# Patient Record
Sex: Female | Born: 1995 | Race: White | Hispanic: No | Smoking: Never smoker
Health system: Southern US, Community
[De-identification: ages and names within clinical notes are randomized; demographics above are authoritative.]

## PROBLEM LIST (undated history)

## (undated) ENCOUNTER — Inpatient Hospital Stay: Payer: Self-pay

## (undated) DIAGNOSIS — J309 Allergic rhinitis, unspecified: Secondary | ICD-10-CM

## (undated) DIAGNOSIS — F419 Anxiety disorder, unspecified: Secondary | ICD-10-CM

## (undated) DIAGNOSIS — F32A Depression, unspecified: Secondary | ICD-10-CM

## (undated) DIAGNOSIS — F329 Major depressive disorder, single episode, unspecified: Secondary | ICD-10-CM

## (undated) DIAGNOSIS — T7840XA Allergy, unspecified, initial encounter: Secondary | ICD-10-CM

## (undated) DIAGNOSIS — R51 Headache: Secondary | ICD-10-CM

## (undated) DIAGNOSIS — Z789 Other specified health status: Secondary | ICD-10-CM

## (undated) DIAGNOSIS — R519 Headache, unspecified: Secondary | ICD-10-CM

## (undated) DIAGNOSIS — N6459 Other signs and symptoms in breast: Secondary | ICD-10-CM

## (undated) DIAGNOSIS — R319 Hematuria, unspecified: Secondary | ICD-10-CM

## (undated) HISTORY — PX: OTHER SURGICAL HISTORY: SHX169

## (undated) HISTORY — DX: Headache: R51

## (undated) HISTORY — DX: Allergy, unspecified, initial encounter: T78.40XA

## (undated) HISTORY — DX: Major depressive disorder, single episode, unspecified: F32.9

## (undated) HISTORY — DX: Anxiety disorder, unspecified: F41.9

## (undated) HISTORY — PX: COSMETIC SURGERY: SHX468

## (undated) HISTORY — DX: Depression, unspecified: F32.A

## (undated) HISTORY — DX: Headache, unspecified: R51.9

## (undated) HISTORY — DX: Allergic rhinitis, unspecified: J30.9

## (undated) HISTORY — PX: BREAST SURGERY: SHX581

## (undated) HISTORY — DX: Hematuria, unspecified: R31.9

## (undated) HISTORY — DX: Other signs and symptoms in breast: N64.59

## (undated) HISTORY — PX: NO PAST SURGERIES: SHX2092

---
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
## 2014-01-13 ENCOUNTER — Emergency Department: Payer: Self-pay | Admitting: Emergency Medicine

## 2014-07-05 ENCOUNTER — Ambulatory Visit: Payer: Self-pay | Admitting: Family Medicine

## 2014-07-14 LAB — HM PAP SMEAR: HM PAP: NEGATIVE

## 2015-01-16 ENCOUNTER — Other Ambulatory Visit: Payer: Self-pay | Admitting: Obstetrics and Gynecology

## 2015-05-04 ENCOUNTER — Encounter: Payer: Self-pay | Admitting: Obstetrics and Gynecology

## 2015-05-04 ENCOUNTER — Ambulatory Visit (INDEPENDENT_AMBULATORY_CARE_PROVIDER_SITE_OTHER): Payer: Medicaid Other | Admitting: Obstetrics and Gynecology

## 2015-05-04 VITALS — BP 105/70 | HR 71 | Ht 62.0 in | Wt 96.6 lb

## 2015-05-04 DIAGNOSIS — Z30018 Encounter for initial prescription of other contraceptives: Secondary | ICD-10-CM

## 2015-05-04 LAB — POCT URINE PREGNANCY: Preg Test, Ur: NEGATIVE

## 2015-05-04 NOTE — Progress Notes (Signed)
     GYNECOLOGY CLINIC PROGRESS NOTE  Subjective:    Patient ID: Marcia Hodges, female    DOB: July 22, 1996, 19 y.o.   MRN: [id number]  HPI [facility]lig is a 19 y.o. G0P0000 here for discussion of Nexplanon and possible insertion.    No pap history.  No other gynecologic concerns.   Had Mirena IUD removed in January 2016 due to debilitating cramping. Is currently using progesterone OCPs.  Desires Nexplanon insertion today as her insurance is due to end this month.   The following portions of the patient's history were reviewed and updated as appropriate: allergies, current medications, past family history, past medical history, past social history, past surgical history and problem list.  Review of Systems A comprehensive review of systems was negative except for: Genitourinary: positive for abnormal menstrual periods Neurological: positive for migraines without aura   Objective:   Blood pressure 105/70, pulse 71, height  (1.575 m), weight 96 lb 9.6 oz (43.817 kg), last menstrual period 01/04/2015. General appearance: alert and no distress Exam deferred.    Assessment:   Desiring Nexplanon contraception   Plan:   Reviewed all forms of birth control options available including abstinence; over the counter/barrier methods; hormonal contraceptive medication including pill, patch, ring, injection,contraceptive implant; hormonal and nonhormonal IUDs;  Risks and benefits reviewed.  Questions were answered.   Patient does not desire to continue use of progesterone OCPs as they worsen her migraines.  Is now interested in Nexplanon.  Reports that she has researched the device online and is familiar with risks and side effects. After further discussion, decision was made to place Nexplanon today.     A total of 15 minutes were spent face-to-face with the patient during this encounter and over half of that time dealt with counseling and coordination of care.   Nexplanon  Insertion Procedure Patient identified, informed consent performed, consent signed.   Patient does understand that irregular bleeding is a very common side effect of this medication. She was advised to have backup contraception for one week after placement. Pregnancy test in clinic today was negative.  Appropriate time out taken.  Patient's left arm was prepped and draped in the usual sterile fashion.. The ruler used to measure and mark insertion area.  Patient was prepped with alcohol swab and then injected with 3 ml of 1% lidocaine.  She was prepped with betadine, Nexplanon removed from packaging,  Device confirmed in needle, then inserted full length of needle and withdrawn per handbook instructions. Nexplanon was able to palpated in the patient's arm; patient palpated the insert herself. There was minimal blood loss.  Patient insertion site covered with guaze and a pressure bandage to reduce any bruising.  The patient tolerated the procedure well and was given post procedure instructions.    Hildred Laser, MD [facility]

## 2015-05-04 NOTE — Patient Instructions (Signed)
Etonogestrel implant What is this medicine? ETONOGESTREL (et oh noe JES trel) is a contraceptive (birth control) device. It is used to prevent pregnancy. It can be used for up to 3 years. This medicine may be used for other purposes; ask your health care provider or pharmacist if you have questions. COMMON BRAND NAME(S): Implanon, Nexplanon What should I tell my health care provider before I take this medicine? They need to know if you have any of these conditions: -abnormal vaginal bleeding -blood vessel disease or blood clots -cancer of the breast, cervix, or liver -depression -diabetes -gallbladder disease -headaches -heart disease or recent heart attack -high blood pressure -high cholesterol -kidney disease -liver disease -renal disease -seizures -tobacco smoker -an unusual or allergic reaction to etonogestrel, other hormones, anesthetics or antiseptics, medicines, foods, dyes, or preservatives -pregnant or trying to get pregnant -breast-feeding How should I use this medicine? This device is inserted just under the skin on the inner side of your upper arm by a health care professional. Talk to your pediatrician regarding the use of this medicine in children. Special care may be needed. Overdosage: If you think you've taken too much of this medicine contact a poison control center or emergency room at once. Overdosage: If you think you have taken too much of this medicine contact a poison control center or emergency room at once. NOTE: This medicine is only for you. Do not share this medicine with others. What if I miss a dose? This does not apply. What may interact with this medicine? Do not take this medicine with any of the following medications: -amprenavir -bosentan -fosamprenavir This medicine may also interact with the following medications: -barbiturate medicines for inducing sleep or treating seizures -certain medicines for fungal infections like ketoconazole and  itraconazole -griseofulvin -medicines to treat seizures like carbamazepine, felbamate, oxcarbazepine, phenytoin, topiramate -modafinil -phenylbutazone -rifampin -some medicines to treat HIV infection like atazanavir, indinavir, lopinavir, nelfinavir, tipranavir, ritonavir -St. John's wort This list may not describe all possible interactions. Give your health care provider a list of all the medicines, herbs, non-prescription drugs, or dietary supplements you use. Also tell them if you smoke, drink alcohol, or use illegal drugs. Some items may interact with your medicine. What should I watch for while using this medicine? This product does not protect you against HIV infection (AIDS) or other sexually transmitted diseases. You should be able to feel the implant by pressing your fingertips over the skin where it was inserted. Tell your doctor if you cannot feel the implant. What side effects may I notice from receiving this medicine? Side effects that you should report to your doctor or health care professional as soon as possible: -allergic reactions like skin rash, itching or hives, swelling of the face, lips, or tongue -breast lumps -changes in vision -confusion, trouble speaking or understanding -dark urine -depressed mood -general ill feeling or flu-like symptoms -light-colored stools -loss of appetite, nausea -right upper belly pain -severe headaches -severe pain, swelling, or tenderness in the abdomen -shortness of breath, chest pain, swelling in a leg -signs of pregnancy -sudden numbness or weakness of the face, arm or leg -trouble walking, dizziness, loss of balance or coordination -unusual vaginal bleeding, discharge -unusually weak or tired -yellowing of the eyes or skin Side effects that usually do not require medical attention (Report these to your doctor or health care professional if they continue or are bothersome.): -acne -breast pain -changes in  weight -cough -fever or chills -headache -irregular menstrual bleeding -itching, burning, and   vaginal discharge -pain or difficulty passing urine -sore throat This list may not describe all possible side effects. Call your doctor for medical advice about side effects. You may report side effects to FDA at 1-800-FDA-1088. Where should I keep my medicine? This drug is given in a hospital or clinic and will not be stored at home. NOTE: This sheet is a summary. It may not cover all possible information. If you have questions about this medicine, talk to your doctor, pharmacist, or health care provider.  2015, [employer]. (2012-01-27 15:37:45)  

## 2015-05-04 NOTE — Addendum Note (Signed)
Addended by: Fabian November on: 05/04/2015 07:48 PM   Modules accepted: Orders, Medications

## 2015-08-02 ENCOUNTER — Ambulatory Visit: Payer: Medicaid Other | Admitting: Obstetrics and Gynecology

## 2015-08-06 NOTE — L&D Delivery Note (Signed)
[facility]  Labor Events:   Preterm labor:   Rupture date:   Rupture time: Unknown time  Rupture type: Clear fluid  Fluid Color:   Induction:   Augmentation:   Complications:   Cervical ripening:          Delivery:   Episiotomy:   Lacerations:   Repair suture:   Repair # of packets:   Blood loss (ml): 200   Information for the patient's newborn:  Marcia Hodges, Marcia Hodges [[id number]][[id number]]    Delivery 04/25/2016 6:06 PM by  Vaginal, Spontaneous Delivery Sex:  female Gestational Age: 6112w4d Delivery Clinician:   Living?:         APGARS  One minute Five minutes Ten minutes  Skin color:        Heart rate:        Grimace:        Muscle tone:        Breathing:        Totals: 7  9      Presentation/position:      Resuscitation:   Cord information:    Disposition of cord blood:     Blood gases sent?  Complications:   Placenta: Delivered:       appearance Newborn Measurements: Weight: 7 lb 1.2 oz (3210 g)  Height: 20.67"  Head circumference:    Chest circumference:    Other providers:    Additional  information: Forceps:   Vacuum:   Breech:   Observed anomalies       Delivery Note At 6:06 PM a viable and healthy female was delivered via Vaginal, Spontaneous Delivery (Presentation: Vertex; LOA position).  APGAR: 7, 9; weight 3210 grams.   Placenta status: removed spontaneously, intact.  Cord:  with the following complications: None.  Cord pH: not obtained  Anesthesia:  Epidural Episiotomy:  None Lacerations:  1st degree left periurethral; hemostatic Suture Repair: None Est. Blood Loss (mL):  200 ml  Mom to postpartum.  Baby to Couplet care / Skin to Skin.  [name] 04/25/2016, 6:32 PM

## 2015-09-05 ENCOUNTER — Ambulatory Visit (INDEPENDENT_AMBULATORY_CARE_PROVIDER_SITE_OTHER): Payer: Medicaid Other | Admitting: Obstetrics and Gynecology

## 2015-09-05 VITALS — BP 109/59 | HR 87 | Wt 101.2 lb

## 2015-09-05 DIAGNOSIS — Z3687 Encounter for antenatal screening for uncertain dates: Secondary | ICD-10-CM

## 2015-09-05 DIAGNOSIS — Z331 Pregnant state, incidental: Secondary | ICD-10-CM

## 2015-09-05 DIAGNOSIS — Z36 Encounter for antenatal screening of mother: Secondary | ICD-10-CM

## 2015-09-05 DIAGNOSIS — Z3490 Encounter for supervision of normal pregnancy, unspecified, unspecified trimester: Secondary | ICD-10-CM

## 2015-09-05 DIAGNOSIS — Z369 Encounter for antenatal screening, unspecified: Secondary | ICD-10-CM

## 2015-09-05 DIAGNOSIS — Z1389 Encounter for screening for other disorder: Secondary | ICD-10-CM

## 2015-09-05 DIAGNOSIS — Z113 Encounter for screening for infections with a predominantly sexual mode of transmission: Secondary | ICD-10-CM

## 2015-09-05 NOTE — Patient Instructions (Signed)
Pregnancy and Zika Virus Disease Zika virus disease, or Zika, is an illness that can spread to people from mosquitoes that carry the virus. It may also spread from person to person through infected body fluids. Zika first occurred in [place], but recently it has spread to new areas. The virus occurs in tropical climates. The location of Zika continues to change. Most people who become infected with Zika virus do not develop serious illness. However, Zika may cause birth defects in an unborn baby whose mother is infected with the virus. It may also increase the risk of miscarriage. WHAT ARE THE SYMPTOMS OF ZIKA VIRUS DISEASE? In many cases, people who have been infected with Zika virus do not develop any symptoms. If symptoms appear, they usually start about a week after the person is infected. Symptoms are usually mild. They may include:  Fever.  Rash.  Red eyes.  Joint pain. HOW DOES ZIKA VIRUS DISEASE SPREAD? The main way that Zika virus spreads is through the bite of a certain type of mosquito. Unlike most types of mosquitos, which bite only at night, the type of mosquito that carries Zika virus bites both at night and during the day. Zika virus can also spread through sexual contact, through a blood transfusion, and from a mother to her baby before or during birth. Once you have had Zika virus disease, it is unlikely that you will get it again. CAN I PASS ZIKA TO MY BABY DURING PREGNANCY? Yes, Zika can pass from a mother to her baby before or during birth. WHAT PROBLEMS CAN ZIKA CAUSE FOR MY BABY? A woman who is infected with Zika virus while pregnant is at risk of having her baby born with a condition in which the brain or head is smaller than expected (microcephaly). Babies who have microcephaly can have developmental delays, seizures, hearing problems, and vision problems. Having Zika virus disease during pregnancy can also increase the risk of miscarriage. HOW CAN ZIKA VIRUS DISEASE BE  PREVENTED? There is no vaccine to prevent Zika. The best way to prevent the disease is to avoid infected mosquitoes and avoid exposure to body fluids that can spread the virus. Avoid any possible exposure to Zika by taking the following precautions. For women and their sex partners:  Avoid traveling to high-risk areas. The locations where Zika is being reported change often. To identify high-risk areas, check the CDC travel website: www.cdc.gov/zika/geo/index.html  If you or your sex partner must travel to a high-risk area, talk with a health care provider before and after traveling.  Take all precautions to avoid mosquito bites if you live in, or travel to, any of the high-risk areas. Insect repellents are safe to use during pregnancy.  Ask your health care provider when it is safe to have sexual contact. For women:  If you are pregnant or trying to become pregnant, avoid sexual contact with persons who may have been exposed to Zika virus, persons who have possible symptoms of Zika, or persons whose history you are unsure about. If you choose to have sexual contact with someone who may have been exposed to Zika virus, use condoms correctly during the entire duration of sexual activity, every time. Do not share sexual devices, as you may be exposed to body fluids.  Ask your health care provider about when it is safe to attempt pregnancy after a possible exposure to Zika virus. WHAT STEPS SHOULD I TAKE TO AVOID MOSQUITO BITES? Take these steps to avoid mosquito bites when you are   in a high-risk area:  Wear loose clothing that covers your arms and legs.  Limit your outdoor activities.  Do not open windows unless they have window screens.  Sleep under mosquito nets.  Use insect repellent. The best insect repellents have:  DEET, picaridin, oil of lemon eucalyptus (OLE), or IR3535 in them.  Higher amounts of an active ingredient in them.  Remember that insect repellents are safe to use  during pregnancy.  Do not use OLE on children who are younger than 3 years of age. Do not use insect repellent on babies who are younger than 2 months of age.  Cover your child's stroller with mosquito netting. Make sure the netting fits snugly and that any loose netting does not cover your child's mouth or nose. Do not use a blanket as a mosquito-protection cover.  Do not apply insect repellent underneath clothing.  If you are using sunscreen, apply the sunscreen before applying the insect repellent.  Treat clothing with permethrin. Do not apply permethrin directly to your skin. Follow label directions for safe use.  Get rid of standing water, where mosquitoes may reproduce. Standing water is often found in items such as buckets, bowls, animal food dishes, and flowerpots. When you return from traveling to any high-risk area, continue taking actions to protect yourself against mosquito bites for 3 weeks, even if you show no signs of illness. This will prevent spreading Zika virus to uninfected mosquitoes. WHAT SHOULD I KNOW ABOUT THE SEXUAL TRANSMISSION OF ZIKA? People can spread Zika to their sexual partners during vaginal, anal, or oral sex, or by sharing sexual devices. Many people with Zika do not develop symptoms, so a person could spread the disease without knowing that they are infected. The greatest risk is to women who are pregnant or who may become pregnant. Zika virus can live longer in semen than it can live in blood. Couples can prevent sexual transmission of the virus by:  Using condoms correctly during the entire duration of sexual activity, every time. This includes vaginal, anal, and oral sex.  Not sharing sexual devices. Sharing increases your risk of being exposed to body fluid from another person.  Avoiding all sexual activity until your health care provider says it is safe. SHOULD I BE TESTED FOR ZIKA VIRUS? A sample of your blood can be tested for Zika virus. A pregnant  woman should be tested if she may have been exposed to the virus or if she has symptoms of Zika. She may also have additional tests done during her pregnancy, such ultrasound testing. Talk with your health care provider about which tests are recommended.   This information is not intended to replace advice given to you by your health care provider. Make sure you discuss any questions you have with your health care provider.   Document Released: 04/12/2015 Document Reviewed: 04/05/2015 Elsevier Interactive Patient Education 2016 [employer] Hyperemesis Gravidarum Hyperemesis gravidarum is a severe form of nausea and vomiting that happens during pregnancy. Hyperemesis is worse than morning sickness. It may cause you to have nausea or vomiting all day for many days. It may keep you from eating and drinking enough food and liquids. Hyperemesis usually occurs during the first half (the first 20 weeks) of pregnancy. It often goes away once a woman is in her second half of pregnancy. However, sometimes hyperemesis continues through an entire pregnancy.  CAUSES  The cause of this condition is not completely known but is thought to be related to changes in   the body's hormones when pregnant. It could be from the high level of the pregnancy hormone or an increase in estrogen in the body.  SIGNS AND SYMPTOMS   Severe nausea and vomiting.  Nausea that does not go away.  Vomiting that does not allow you to keep any food down.  Weight loss and body fluid loss (dehydration).  Having no desire to eat or not liking food you have previously enjoyed. DIAGNOSIS  Your health care provider will do a physical exam and ask you about your symptoms. He or she may also order blood tests and urine tests to make sure something else is not causing the problem.  TREATMENT  You may only need medicine to control the problem. If medicines do not control the nausea and vomiting, you will be treated in the hospital to prevent  dehydration, increased acid in the blood (acidosis), weight loss, and changes in the electrolytes in your body that may harm the unborn baby (fetus). You may need IV fluids.  HOME CARE INSTRUCTIONS   Only take over-the-counter or prescription medicines as directed by your health care provider.  Try eating a couple of dry crackers or toast in the morning before getting out of bed.  Avoid foods and smells that upset your stomach.  Avoid fatty and spicy foods.  Eat 5-6 small meals a day.  Do not drink when eating meals. Drink between meals.  For snacks, eat high-protein foods, such as cheese.  Eat or suck on things that have ginger in them. Ginger helps nausea.  Avoid food preparation. The smell of food can spoil your appetite.  Avoid iron pills and iron in your multivitamins until after 3-4 months of being pregnant. However, consult with your health care provider before stopping any prescribed iron pills. SEEK MEDICAL CARE IF:   Your abdominal pain increases.  You have a severe headache.  You have vision problems.  You are losing weight. SEEK IMMEDIATE MEDICAL CARE IF:   You are unable to keep fluids down.  You vomit blood.  You have constant nausea and vomiting.  You have excessive weakness.  You have extreme thirst.  You have dizziness or fainting.  You have a fever or persistent symptoms for more than 2-3 days.  You have a fever and your symptoms suddenly get worse. MAKE SURE YOU:   Understand these instructions.  Will watch your condition.  Will get help right away if you are not doing well or get worse.   This information is not intended to replace advice given to you by your health care provider. Make sure you discuss any questions you have with your health care provider.   Document Released: 07/22/2005 Document Revised: 05/12/2013 Document Reviewed: 03/03/2013 Elsevier Interactive Patient Education 2016 Elsevier Inc. Minor Illnesses and Medications in  Pregnancy  Cold/Flu:  Sudafed for congestion- Robitussin (plain) for cough- Tylenol for discomfort.  Please follow the directions on the label.  Try not to take any more than needed.  OTC Saline nasal spray and air humidifier or cool-mist  Vaporizer to sooth nasal irritation and to loosen congestion.  It is also important to increase intake of non carbonated fluids, especially if you have a fever.  Constipation:  Colace-2 capsules at bedtime; Metamucil- follow directions on label; Senokot- 1 tablet at bedtime.  Any one of these medications can be used.  It is also very important to increase fluids and fruits along with regular exercise.  If problem persists please call the office.  Diarrhea:    Kaopectate as directed on the label.  Eat a bland diet and increase fluids.  Avoid highly seasoned foods.  Headache:  Tylenol 1 or 2 tablets every 3-4 hours as needed  Indigestion:  Maalox, Mylanta, Tums or Rolaids- as directed on label.  Also try to eat small meals and avoid fatty, greasy or spicy foods.  Nausea with or without Vomiting:  Nausea in pregnancy is caused by increased levels of hormones in the body which influence the digestive system and cause irritation when stomach acids accumulate.  Symptoms usually subside after 1st trimester of pregnancy.  Try the following:  Keep saltines, graham crackers or dry toast by your bed to eat upon awakening.  Don't let your stomach get empty.  Try to eat 5-6 small meals per day instead of 3 large ones.  Avoid greasy fatty or highly seasoned foods.   Take OTC Unisom 1 tablet at bed time along with OTC Vitamin B6 25-50 mg 3 times per day.    If nausea continues with vomiting and you are unable to keep down food and fluids you may need a prescription medication.  Please notify your provider.   Sore throat:  Chloraseptic spray, throat lozenges and or plain Tylenol.  Vaginal Yeast Infection:  OTC Monistat for 7 days as directed on label.  If symptoms do not  resolve within a week notify provider.  If any of the above problems do not subside with recommended treatment please call the office for further assistance.   Do not take Aspirin, Advil, Motrin or Ibuprofen.  * * OTC= Over the counter Commonly Asked Questions During Pregnancy  Cats: A parasite can be excreted in cat feces.  To avoid exposure you need to have another person empty the little box.  If you must empty the litter box you will need to wear gloves.  Wash your hands after handling your cat.  This parasite can also be found in raw or undercooked meat so this should also be avoided.  Colds, Sore Throats, Flu: Please check your medication sheet to see what you can take for symptoms.  If your symptoms are unrelieved by these medications please call the office.  Dental Work: Most any dental work your dentist recommends is permitted.  X-rays should only be taken during the first trimester if absolutely necessary.  Your abdomen should be shielded with a lead apron during all x-rays.  Please notify your provider prior to receiving any x-rays.  Novocaine is fine; gas is not recommended.  If your dentist requires a note from us prior to dental work please call the office and we will provide one for you.  Exercise: Exercise is an important part of staying healthy during your pregnancy.  You may continue most exercises you were accustomed to prior to pregnancy.  Later in your pregnancy you will most likely notice you have difficulty with activities requiring balance like riding a bicycle.  It is important that you listen to your body and avoid activities that put you at a higher risk of falling.  Adequate rest and staying well hydrated are a must!  If you have questions about the safety of specific activities ask your provider.    Exposure to Children with illness: Try to avoid obvious exposure; report any symptoms to us when noted,  If you have chicken pos, red measles or mumps, you should be immune to  these diseases.   Please do not take any vaccines while pregnant unless you have checked with your   OB provider.  Fetal Movement: After 28 weeks we recommend you do "kick counts" twice daily.  Lie or sit down in a calm quiet environment and count your baby movements "kicks".  You should feel your baby at least 10 times per hour.  If you have not felt 10 kicks within the first hour get up, walk around and have something sweet to eat or drink then repeat for an additional hour.  If count remains less than 10 per hour notify your provider.  Fumigating: Follow your pest control agent's advice as to how long to stay out of your home.  Ventilate the area well before re-entering.  Hemorrhoids:   Most over-the-counter preparations can be used during pregnancy.  Check your medication to see what is safe to use.  It is important to use a stool softener or fiber in your diet and to drink lots of liquids.  If hemorrhoids seem to be getting worse please call the office.   Hot Tubs:  Hot tubs Jacuzzis and saunas are not recommended while pregnant.  These increase your internal body temperature and should be avoided.  Intercourse:  Sexual intercourse is safe during pregnancy as long as you are comfortable, unless otherwise advised by your provider.  Spotting may occur after intercourse; report any bright red bleeding that is heavier than spotting.  Labor:  If you know that you are in labor, please go to the hospital.  If you are unsure, please call the office and let us help you decide what to do.  Lifting, straining, etc:  If your job requires heavy lifting or straining please check with your provider for any limitations.  Generally, you should not lift items heavier than that you can lift simply with your hands and arms (no back muscles)  Painting:  Paint fumes do not harm your pregnancy, but may make you ill and should be avoided if possible.  Latex or water based paints have less odor than oils.  Use adequate  ventilation while painting.  Permanents & Hair Color:  Chemicals in hair dyes are not recommended as they cause increase hair dryness which can increase hair loss during pregnancy.  " Highlighting" and permanents are allowed.  Dye may be absorbed differently and permanents may not hold as well during pregnancy.  Sunbathing:  Use a sunscreen, as skin burns easily during pregnancy.  Drink plenty of fluids; avoid over heating.  Tanning Beds:  Because their possible side effects are still unknown, tanning beds are not recommended.  Ultrasound Scans:  Routine ultrasounds are performed at approximately 20 weeks.  You will be able to see your baby's general anatomy an if you would like to know the gender this can usually be determined as well.  If it is questionable when you conceived you may also receive an ultrasound early in your pregnancy for dating purposes.  Otherwise ultrasound exams are not routinely performed unless there is a medical necessity.  Although you can request a scan we ask that you pay for it when conducted because insurance does not cover " patient request" scans.  Work: If your pregnancy proceeds without complications you may work until your due date, unless your physician or employer advises otherwise.  Round Ligament Pain/Pelvic Discomfort:  Sharp, shooting pains not associated with bleeding are fairly common, usually occurring in the second trimester of pregnancy.  They tend to be worse when standing up or when you remain standing for long periods of time.  These are the result of   pressure of certain pelvic ligaments called "round ligaments".  Rest, Tylenol and heat seem to be the most effective relief.  As the womb and fetus grow, they rise out of the pelvis and the discomfort improves.  Please notify the office if your pain seems different than that described.  It may represent a more serious condition.   

## 2015-09-05 NOTE — Progress Notes (Signed)
   [facility] presents for NOB nurse interview visit. G-1. P-0. Has pregnancy confirmation at [facility] on 08/23/15. LMP: 07/23/2015 (approx), EDD: 04/28/2016 by lmp. Pregnancy education material explained and given. No cats in the home. NOB labs ordered.  HIV labs and Drug screen were explained optional and she could opt out of tests but did not decline. Drug screen ordered/declined. PNV encouraged. NT to discuss with provider. Has mostly nausea. Pt encouraged to take Vit. B6 3xd and Unisom at night.  Pt. To follow up with provider in 6 weeks for NOB physical. Appt may need adjusting if viability and dating ultrasound differs.  Pt also brought copies of all immunizations which will be scanned to chart. All questions answered.  ZIKA EXPOSURE SCREEN:  The patient has not traveled to a [place] Virus endemic area within the past 6 months, nor has she had unprotected sex with a partner who has travelled to a [place] endemic region within the past 6 months. The patient has been advised to notify us if these factors change any time during this current pregnancy, so adequate testing and monitoring can be initiated.

## 2015-09-06 LAB — PAIN MGT SCRN (14 DRUGS), UR
Amphetamine Screen, Ur: NEGATIVE ng/mL
BARBITURATE SCRN UR: NEGATIVE ng/mL
BENZODIAZEPINE SCREEN, URINE: NEGATIVE ng/mL
BUPRENORPHINE, URINE: NEGATIVE ng/mL
COCAINE(METAB.) SCREEN, URINE: NEGATIVE ng/mL
Cannabinoids Ur Ql Scn: NEGATIVE ng/mL
Creatinine(Crt), U: 36.9 mg/dL (ref 20.0–300.0)
FENTANYL, URINE: NEGATIVE pg/mL
MEPERIDINE SCREEN, URINE: NEGATIVE ng/mL
METHADONE SCREEN, URINE: NEGATIVE ng/mL
OPIATE SCRN UR: NEGATIVE ng/mL
OXYCODONE+OXYMORPHONE UR QL SCN: NEGATIVE ng/mL
PCP Scrn, Ur: NEGATIVE ng/mL
PROPOXYPHENE SCREEN: NEGATIVE ng/mL
Ph of Urine: 7.6 (ref 4.5–8.9)
Tramadol Ur Ql Scn: NEGATIVE ng/mL

## 2015-09-06 LAB — ABO

## 2015-09-06 LAB — URINALYSIS, ROUTINE W REFLEX MICROSCOPIC
BILIRUBIN UA: NEGATIVE
Glucose, UA: NEGATIVE
Ketones, UA: NEGATIVE
Leukocytes, UA: NEGATIVE
Nitrite, UA: NEGATIVE
PH UA: 7.5 (ref 5.0–7.5)
PROTEIN UA: NEGATIVE
RBC UA: NEGATIVE
Specific Gravity, UA: 1.009 (ref 1.005–1.030)
UUROB: 0.2 mg/dL (ref 0.2–1.0)

## 2015-09-06 LAB — CBC WITH DIFFERENTIAL/PLATELET
BASOS ABS: 0 10*3/uL (ref 0.0–0.2)
Basos: 1 %
EOS (ABSOLUTE): 0.1 10*3/uL (ref 0.0–0.4)
Eos: 1 %
Hematocrit: 38.2 % (ref 34.0–46.6)
Hemoglobin: 13 g/dL (ref 11.1–15.9)
Immature Grans (Abs): 0 10*3/uL (ref 0.0–0.1)
Immature Granulocytes: 0 %
LYMPHS ABS: 1.9 10*3/uL (ref 0.7–3.1)
Lymphs: 28 %
MCH: 29.1 pg (ref 26.6–33.0)
MCHC: 34 g/dL (ref 31.5–35.7)
MCV: 86 fL (ref 79–97)
MONOS ABS: 0.4 10*3/uL (ref 0.1–0.9)
Monocytes: 6 %
NEUTROS ABS: 4.2 10*3/uL (ref 1.4–7.0)
Neutrophils: 64 %
PLATELETS: 271 10*3/uL (ref 150–379)
RBC: 4.46 x10E6/uL (ref 3.77–5.28)
RDW: 13 % (ref 12.3–15.4)
WBC: 6.5 10*3/uL (ref 3.4–10.8)

## 2015-09-06 LAB — RH TYPE: Rh Factor: POSITIVE

## 2015-09-06 LAB — NICOTINE SCREEN, URINE: COTININE UR QL SCN: NEGATIVE ng/mL

## 2015-09-06 LAB — RPR: RPR: NONREACTIVE

## 2015-09-06 LAB — RUBELLA ANTIBODY, IGM: Rubella IgM: <20 AU/mL (ref 0.0–19.9)

## 2015-09-06 LAB — HEPATITIS B SURFACE ANTIGEN: Hepatitis B Surface Ag: NEGATIVE

## 2015-09-06 LAB — ANTIBODY SCREEN: Antibody Screen: NEGATIVE

## 2015-09-06 LAB — VARICELLA ZOSTER ANTIBODY, IGM: VARICELLA IGM: 1.28 {index} — AB (ref 0.00–0.90)

## 2015-09-06 LAB — HIV ANTIBODY (ROUTINE TESTING W REFLEX): HIV Screen 4th Generation wRfx: NONREACTIVE

## 2015-09-07 LAB — GC/CHLAMYDIA PROBE AMP
CHLAMYDIA, DNA PROBE: NEGATIVE
NEISSERIA GONORRHOEAE BY PCR: NEGATIVE

## 2015-09-07 LAB — CULTURE, OB URINE

## 2015-09-07 LAB — URINE CULTURE, OB REFLEX

## 2015-09-13 ENCOUNTER — Ambulatory Visit (INDEPENDENT_AMBULATORY_CARE_PROVIDER_SITE_OTHER): Payer: Medicaid Other

## 2015-09-13 DIAGNOSIS — Z331 Pregnant state, incidental: Secondary | ICD-10-CM | POA: Diagnosis not present

## 2015-09-13 DIAGNOSIS — Z36 Encounter for antenatal screening of mother: Secondary | ICD-10-CM

## 2015-09-13 DIAGNOSIS — Z3490 Encounter for supervision of normal pregnancy, unspecified, unspecified trimester: Secondary | ICD-10-CM

## 2015-09-13 DIAGNOSIS — Z369 Encounter for antenatal screening, unspecified: Secondary | ICD-10-CM

## 2015-09-13 DIAGNOSIS — Z3687 Encounter for antenatal screening for uncertain dates: Secondary | ICD-10-CM

## 2015-09-21 ENCOUNTER — Emergency Department
Admission: EM | Admit: 2015-09-21 | Discharge: 2015-09-21 | Disposition: A | Discharge status: Home / Self Care | Payer: Medicaid Other | Attending: Emergency Medicine | Admitting: Emergency Medicine

## 2015-09-21 ENCOUNTER — Emergency Department: Payer: Medicaid Other

## 2015-09-21 ENCOUNTER — Encounter: Payer: Self-pay | Admitting: *Deleted

## 2015-09-21 ENCOUNTER — Telehealth: Payer: Self-pay | Admitting: Obstetrics and Gynecology

## 2015-09-21 DIAGNOSIS — R1030 Lower abdominal pain, unspecified: Secondary | ICD-10-CM | POA: Diagnosis not present

## 2015-09-21 DIAGNOSIS — O9989 Other specified diseases and conditions complicating pregnancy, childbirth and the puerperium: Secondary | ICD-10-CM | POA: Insufficient documentation

## 2015-09-21 DIAGNOSIS — N898 Other specified noninflammatory disorders of vagina: Secondary | ICD-10-CM | POA: Diagnosis not present

## 2015-09-21 DIAGNOSIS — Z3A08 8 weeks gestation of pregnancy: Secondary | ICD-10-CM | POA: Diagnosis not present

## 2015-09-21 DIAGNOSIS — O26891 Other specified pregnancy related conditions, first trimester: Secondary | ICD-10-CM

## 2015-09-21 LAB — HCG, QUANTITATIVE, PREGNANCY: HCG, BETA CHAIN, QUANT, S: 191411 m[IU]/mL — AB (ref <5)

## 2015-09-21 LAB — CHLAMYDIA/NGC RT PCR (ARMC ONLY)
CHLAMYDIA TR: NOT DETECTED
N GONORRHOEAE: NOT DETECTED

## 2015-09-21 LAB — WET PREP, GENITAL
Clue Cells Wet Prep HPF POC: NONE SEEN
Sperm: NONE SEEN
Trich, Wet Prep: NONE SEEN
Yeast Wet Prep HPF POC: NONE SEEN

## 2015-09-21 LAB — POCT PREGNANCY, URINE: PREG TEST UR: POSITIVE — AB

## 2015-09-21 LAB — COMPREHENSIVE METABOLIC PANEL
ALBUMIN: 4.3 g/dL (ref 3.5–5.0)
ALK PHOS: 52 U/L (ref 38–126)
ALT: 19 U/L (ref 14–54)
ANION GAP: 7 (ref 5–15)
AST: 15 U/L (ref 15–41)
BILIRUBIN TOTAL: 0.5 mg/dL (ref 0.3–1.2)
BUN: 6 mg/dL (ref 6–20)
CALCIUM: 9.2 mg/dL (ref 8.9–10.3)
CO2: 24 mmol/L (ref 22–32)
Chloride: 106 mmol/L (ref 101–111)
Creatinine, Ser: 0.39 mg/dL — ABNORMAL LOW (ref 0.44–1.00)
GFR calc Af Amer: >60 mL/min (ref >60)
GFR calc non Af Amer: >60 mL/min (ref >60)
GLUCOSE: 102 mg/dL — AB (ref 65–99)
POTASSIUM: 3.8 mmol/L (ref 3.5–5.1)
SODIUM: 137 mmol/L (ref 135–145)
TOTAL PROTEIN: 8 g/dL (ref 6.5–8.1)

## 2015-09-21 LAB — CBC
HEMATOCRIT: 38.7 % (ref 35.0–47.0)
HEMOGLOBIN: 13.1 g/dL (ref 12.0–16.0)
MCH: 29.5 pg (ref 26.0–34.0)
MCHC: 33.9 g/dL (ref 32.0–36.0)
MCV: 87.1 fL (ref 80.0–100.0)
Platelets: 250 10*3/uL (ref 150–440)
RBC: 4.44 MIL/uL (ref 3.80–5.20)
RDW: 13 % (ref 11.5–14.5)
WBC: 7.5 10*3/uL (ref 3.6–11.0)

## 2015-09-21 LAB — URINALYSIS COMPLETE WITH MICROSCOPIC (ARMC ONLY)
BACTERIA UA: NONE SEEN
Bilirubin Urine: NEGATIVE
GLUCOSE, UA: NEGATIVE mg/dL
Hgb urine dipstick: NEGATIVE
Ketones, ur: NEGATIVE mg/dL
Leukocytes, UA: NEGATIVE
Nitrite: NEGATIVE
PROTEIN: NEGATIVE mg/dL
RBC / HPF: NONE SEEN RBC/hpf (ref 0–5)
Specific Gravity, Urine: 1.009 (ref 1.005–1.030)
pH: 7 (ref 5.0–8.0)

## 2015-09-21 MED ORDER — ONDANSETRON 4 MG PO TBDP: 4.0000 mg | ORAL_TABLET | Freq: Three times a day (TID) | ORAL | Status: DC | PRN

## 2015-09-21 NOTE — Telephone Encounter (Signed)
Pt is 8wks and 4 days and woke up this morning with sever pain in her lower abdomen. Please call pt and talk to her. There is not a spot on ur sdchedule so if she needs to come in let me know.

## 2015-09-21 NOTE — ED Notes (Signed)
States lower abd pain, sharp in nature, pt denies any bleeding, states 8 weeks pregnet, 1st pregnancy

## 2015-09-21 NOTE — Telephone Encounter (Signed)
Attempted to call pt, she is currently admitted in ED.

## 2015-09-21 NOTE — ED Provider Notes (Signed)
[facility] Emergency Department Provider Note     Time seen: ----------------------------------------- 12:05 PM on 09/21/2015 -----------------------------------------    I have reviewed the triage vital signs and the nursing notes.   HISTORY  Chief Complaint Abdominal Pain    HPI Marcia Hodges is a 20 y.o. female who presents to ER with sharp lower abdominal pain as well as yellow vaginal discharge area patient denies any vaginal bleeding, states she's run a weeks pregnant. She is G1 P0, has not had this pain before. Nothing makes her pain better or worse.   Past Medical History  Diagnosis Date  . 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 Headache     migraine- w/o aura    There are no active problems to display for this patient.   Past Surgical History  Procedure Laterality Date  . Wisdom teeth pulled      Allergies Review of patient's allergies indicates no known allergies.  Social History Social History  Substance Use Topics  . Smoking status: Never Smoker   . Smokeless tobacco: Never Used  . Alcohol Use: No    Review of Systems Constitutional: Negative for fever. Eyes: Negative for visual changes. ENT: Negative for sore throat. Cardiovascular: Negative for chest pain. Respiratory: Negative for shortness of breath. Gastrointestinal: Positive for abdominal pain, negative for vomiting or diarrhea Genitourinary: Negative for dysuria. Negative for vaginal bleeding, positive for discharge Musculoskeletal: Negative for back pain. Skin: Negative for rash. Neurological: Negative for headaches, focal weakness or numbness.  10-point ROS otherwise negative.  ____________________________________________   PHYSICAL EXAM:  VITAL SIGNS: ED Triage Vitals  Enc Vitals Group     BP 09/21/15 1118 109/72 mmHg     Pulse Rate 09/21/15 1118 83     Resp 09/21/15 1118 18     Temp 09/21/15 1118 98.2 F (36.8 C)     Temp Source 09/21/15 1118 Oral     SpO2 09/21/15 1118 95 %     Weight 09/21/15 1118 105 lb (47.628 kg)     Height 09/21/15 1118  (1.575 m)     Head Cir --      Peak Flow --      Pain Score 09/21/15 1119 5     Pain Loc --      Pain Edu? --      Excl. in GC? --     Constitutional: Alert and oriented. Well appearing and in no distress. Eyes: Conjunctivae are normal. PERRL. Normal extraocular movements. ENT   Head: Normocephalic and atraumatic.   Nose: No congestion/rhinnorhea.   Mouth/Throat: Mucous membranes are moist.   Neck: No stridor. Cardiovascular: Normal rate, regular rhythm. Normal and symmetric distal pulses are present in all extremities. No murmurs, rubs, or gallops. Respiratory: Normal respiratory effort without tachypnea nor retractions. Breath sounds are clear and equal bilaterally. No wheezes/rales/rhonchi. Gastrointestinal: Mild lower abdominal tenderness, no rebound or guarding. Normal bowel sounds. Genitourinary: Musculoskeletal: Nontender with normal range of motion in all extremities. No joint effusions.  No lower extremity tenderness nor edema. Neurologic:  Normal speech and language. No gross focal neurologic deficits are appreciated. Speech is normal. No gait instability. Skin:  Skin is warm, dry and intact. No rash noted. Psychiatric: Mood and affect are normal. Speech and behavior are normal. Patient exhibits appropriate insight and judgment. ____________________________________________  ED COURSE:  Pertinent labs & imaging results that were available during my care of the  patient were reviewed by me and considered in my medical decision making (see chart for details). Patient was sharp lower abdominal pain and pregnancy, we'll obtain ultrasound imaging and basic labs. ____________________________________________    LABS (pertinent positives/negatives)  Labs Reviewed  WET PREP, GENITAL -  Abnormal; Notable for the following:    WBC, Wet Prep HPF POC FEW (*)    All other components within normal limits  COMPREHENSIVE METABOLIC PANEL - Abnormal; Notable for the following:    Glucose, Bld 102 (*)    Creatinine, Ser 0.39 (*)    All other components within normal limits  URINALYSIS COMPLETEWITH MICROSCOPIC ([facility] ONLY) - Abnormal; Notable for the following:    Color, Urine YELLOW (*)    APPearance CLEAR (*)    Squamous Epithelial / LPF 0-5 (*)    All other components within normal limits  POCT PREGNANCY, URINE - Abnormal; Notable for the following:    Preg Test, Ur POSITIVE (*)    All other components within normal limits  CHLAMYDIA/NGC RT PCR ([facility] ONLY)  CBC  HCG, QUANTITATIVE, PREGNANCY    ____________________________________________  FINAL ASSESSMENT AND PLAN  vaginal discharge and pregnancy  Plan: Patient with labs and imaging as dictated above. No abnormalities are seen on exam. Labs are reassuring. She had an ultrasound one week ago which showed a normal IUP. She is stable for outpatient follow-up with her OB/GYN doctor  Emily Filbert, MD   Emily Filbert, MD 09/21/15 [phone]

## 2015-09-21 NOTE — Discharge Instructions (Signed)

## 2015-10-17 ENCOUNTER — Ambulatory Visit (INDEPENDENT_AMBULATORY_CARE_PROVIDER_SITE_OTHER): Payer: Medicaid Other | Admitting: Obstetrics and Gynecology

## 2015-10-17 ENCOUNTER — Encounter: Payer: Self-pay | Admitting: Obstetrics and Gynecology

## 2015-10-17 VITALS — BP 100/66 | HR 85 | Wt 105.5 lb

## 2015-10-17 DIAGNOSIS — Z2839 Other underimmunization status: Secondary | ICD-10-CM

## 2015-10-17 DIAGNOSIS — O9989 Other specified diseases and conditions complicating pregnancy, childbirth and the puerperium: Secondary | ICD-10-CM

## 2015-10-17 DIAGNOSIS — Z3402 Encounter for supervision of normal first pregnancy, second trimester: Secondary | ICD-10-CM | POA: Diagnosis not present

## 2015-10-17 DIAGNOSIS — O219 Vomiting of pregnancy, unspecified: Secondary | ICD-10-CM

## 2015-10-17 DIAGNOSIS — Z283 Underimmunization status: Secondary | ICD-10-CM

## 2015-10-17 DIAGNOSIS — G43009 Migraine without aura, not intractable, without status migrainosus: Secondary | ICD-10-CM

## 2015-10-17 LAB — POCT URINALYSIS DIPSTICK
BILIRUBIN UA: NEGATIVE
GLUCOSE UA: NEGATIVE
KETONES UA: NEGATIVE
Leukocytes, UA: NEGATIVE
Nitrite, UA: NEGATIVE
Protein, UA: NEGATIVE
RBC UA: NEGATIVE
SPEC GRAV UA: 1.015
Urobilinogen, UA: NEGATIVE
pH, UA: 6.5

## 2015-10-17 MED ORDER — BUTALBITAL-APAP-CAFFEINE 50-325-40 MG PO CAPS: 1.0000 | ORAL_CAPSULE | Freq: Four times a day (QID) | ORAL | Status: DC | PRN

## 2015-10-17 NOTE — Patient Instructions (Signed)

## 2015-10-17 NOTE — Progress Notes (Signed)
OBSTETRIC INITIAL PRENATAL VISIT  Subjective:    Marcia Hodges is being seen today for her first obstetrical visit.  She had confirmation performed at [facility] at [redacted] weeks gestation. This is not a planned pregnancy. She is a G69P0 female at 41w2dgestation, Estimated Date of Delivery: 04/28/16 with Patient's last menstrual period was 07/23/2015. (consistent with 7 week sono). Her obstetrical history is significant for migraines without aura. Relationship with FOB: significant other, living together. Patient does intend to breast feed. Pregnancy history fully reviewed.    Obstetric History   G1   P0   T0   P0   A0   TAB0   SAB0   E0   M0   L0     # Outcome Date GA Lbr Len/2nd Weight Sex Delivery Anes PTL Lv  1 Current               Gynecologic History:  Last pap smear was December 2016(?) .  Results were normal.  Denies h/o abnormal pap smears in the past.  Denies history of STIs.    Past Medical History  Diagnosis Date  . 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 Headache     migraine- w/o aura    Family History  Problem Relation Age of Onset  . Hypertension Father   . Diabetes Neg Hx   . Cancer Neg Hx   . Hearing loss Neg Hx     Past Surgical History  Procedure Laterality Date  . Wisdom teeth pulled      Social History   Social History  . Marital Status: Single    Spouse Name: N/A  . Number of Children: N/A  . Years of Education: N/A   Occupational History  . Not on file.   Social History Main Topics  . Smoking status: Never Smoker   . Smokeless tobacco: Never Used  . Alcohol Use: No  . Drug Use: No  . Sexual Activity:    Partners: Male     Comment: Pregnant    Other Topics Concern  . Not on file   Social History Narrative    Current Outpatient Prescriptions on File Prior to Visit  Medication Sig Dispense Refill  . Prenatal Vit-Fe Fumarate-FA (MULTIVITAMIN-PRENATAL) 27-0.8 MG TABS  tablet Take 1 tablet by mouth daily at 12 noon.    . ondansetron (ZOFRAN ODT) 4 MG disintegrating tablet Take 1 tablet (4 mg total) by mouth every 8 (eight) hours as needed for nausea or vomiting. (Patient not taking: Reported on 10/17/2015) 20 tablet 0   No current facility-administered medications on file prior to visit.    No Known Allergies   Review of Systems General:Not Present- Fever, Weight Loss and Weight Gain. Skin:Not Present- Rash. HEENT:Not Present- Blurred Vision, Headache and Bleeding Gums. Respiratory:Not Present- Difficulty Breathing. Breast:Not Present- Breast Mass. Cardiovascular:Not Present- Chest Pain, Elevated Blood Pressure, Fainting / Blacking Out and Shortness of Breath. Gastrointestinal:Present - Nausea and Vomiting (has improved significantly with Zofran). Not Present - Abdominal Pain, Constipation. Female Genitourinary:Not Present- Frequency, Painful Urination, Pelvic Pain, Vaginal Bleeding, Vaginal Discharge, Contractions, regular, Fetal Movements Decreased, Urinary Complaints and Vaginal Fluid. Musculoskeletal:Not Present- Back Pain and Leg Cramps. Neurological:Present - Migraines without aura (not relieved with Tylenol).  Not Present- Dizziness. Psychiatric:Not Present- Depression.     Objective:   Blood pressure 100/66, pulse 85, weight 105 lb 8 oz (47.854 kg), last menstrual period 07/23/2015.  Body mass index is 19.29 kg/(m^2).  General Appearance:    Alert, cooperative, no distress, appears stated age  Head:    Normocephalic, without obvious abnormality, atraumatic  Eyes:    PERRL, conjunctiva/corneas clear, EOM's intact, both eyes  Ears:    Normal external ear canals, both ears  Nose:   Nares normal, septum midline, mucosa normal, no drainage or sinus tenderness  Throat:   Lips, mucosa, and tongue normal; teeth and gums normal  Neck:   Supple, symmetrical, trachea midline, no adenopathy; thyroid: no enlargement/tenderness/nodules; no  carotid bruit or JVD  Back:     Symmetric, no curvature, ROM normal, no CVA tenderness  Lungs:     Clear to auscultation bilaterally, respirations unlabored  Chest Wall:    No tenderness or deformity   Heart:    Regular rate and rhythm, S1 and S2 normal, no murmur, rub or gallop  Breast Exam:    No tenderness, masses, or nipple abnormality.  Right breast smaller in size than left.   Abdomen:     Soft, non-tender, bowel sounds active all four quadrants, no masses, no organomegaly. FHT 153 bpm.  Genitalia:    Pelvic:external genitalia normal, vagina without lesions, discharge, or tenderness, rectovaginal septum  normal. Cervix normal in appearance, no cervical motion tenderness, no adnexal masses or tenderness.  Pregnancy positive findings: uterine enlargement: 12 wk size, nontender.   Rectal:    Normal external sphincter.  No hemorrhoids appreciated. Internal exam not done.   Extremities:   Extremities normal, atraumatic, no cyanosis or edema  Pulses:   2+ and symmetric all extremities  Skin:   Skin color, texture, turgor normal, no rashes or lesions  Lymph nodes:   Cervical, supraclavicular, and axillary nodes normal  Neurologic:   CNII-XII intact, normal strength, sensation and reflexes throughout     Assessment:   Pregnancy at 12 and 2/7 weeks   Migraines without aura Nausea and vomiting of pregnancy Rubella non-immune.    Plan:   Initial labs reviewed.   Rubella non-immune, will need MMR postpartum.  Prenatal vitamins encouraged. Problem list reviewed and updated. New OB counseling:  The patient has been given an overview regarding routine prenatal care.  Recommendations regarding diet, weight gain, and exercise in pregnancy were given. Prenatal testing, optional genetic testing, and ultrasound use in pregnancy were reviewed.  AFP3 discussed: ordered. Benefits of Breast Feeding were discussed. The patient is encouraged to consider nursing her baby post partum. Prescribed Fioricet  for migraines.  Continue Zofran for nausea/vomiting as needed.  Follow up in 4 weeks.  50% of 25 min visit spent on counseling and coordination of care.   

## 2015-10-18 ENCOUNTER — Telehealth: Payer: Self-pay

## 2015-10-18 NOTE — Telephone Encounter (Signed)
Called pt, questioned where and if she had nexplanon removed. Pt states that [facility].

## 2015-10-18 NOTE — Telephone Encounter (Signed)
-----   Message from Hildred LaserAnika Cherry, MD sent at 10/17/2015  6:38 PM EDT ----- Regarding: New OB Ok so don't kill me.  I was going back through this patient's chart after she left her NOB visit and noticed that I placed her Nexplanon back in September.  There is no note saying it was removed, unless she went somewhere else like the health department.  Can we call her to find out if she still has her Nexplanon although she is pregnant?

## 2015-10-23 ENCOUNTER — Other Ambulatory Visit: Payer: Self-pay | Admitting: Obstetrics and Gynecology

## 2015-10-23 DIAGNOSIS — Z3682 Encounter for antenatal screening for nuchal translucency: Secondary | ICD-10-CM

## 2015-10-25 ENCOUNTER — Other Ambulatory Visit: Payer: Self-pay | Admitting: Obstetrics and Gynecology

## 2015-10-25 ENCOUNTER — Ambulatory Visit (INDEPENDENT_AMBULATORY_CARE_PROVIDER_SITE_OTHER): Payer: Medicaid Other

## 2015-10-25 DIAGNOSIS — O283 Abnormal ultrasonic finding on antenatal screening of mother: Secondary | ICD-10-CM

## 2015-10-25 DIAGNOSIS — Z3682 Encounter for antenatal screening for nuchal translucency: Secondary | ICD-10-CM

## 2015-10-30 LAB — FIRST TRIMESTER SCREEN W/NT
CRL: 71.6 mm
DIA MoM: 0.76
DIA Value: 221.7 pg/mL
Gest Age-Collect: 13 weeks
HCG MOM: 0.82
HCG VALUE: 86.1 [IU]/mL
Maternal Age At EDD: 21.7 years
NUCHAL TRANSLUCENCY MOM: 1.27
Nuchal Translucency: 1.6 mm
Number of Fetuses: 1
PAPP-A MOM: 0.84
PAPP-A VALUE: 1447.5 ng/mL
PDF: 0
Test Results:: NEGATIVE
WEIGHT: 105 [lb_av]

## 2015-11-14 ENCOUNTER — Ambulatory Visit (INDEPENDENT_AMBULATORY_CARE_PROVIDER_SITE_OTHER): Payer: Medicaid Other | Admitting: Obstetrics and Gynecology

## 2015-11-14 ENCOUNTER — Other Ambulatory Visit: Payer: Self-pay | Admitting: Obstetrics and Gynecology

## 2015-11-14 VITALS — BP 103/65 | HR 82 | Wt 108.6 lb

## 2015-11-14 DIAGNOSIS — Z1379 Encounter for other screening for genetic and chromosomal anomalies: Secondary | ICD-10-CM

## 2015-11-14 DIAGNOSIS — Z3402 Encounter for supervision of normal first pregnancy, second trimester: Secondary | ICD-10-CM

## 2015-11-14 LAB — POCT URINALYSIS DIPSTICK
Bilirubin, UA: NEGATIVE
Blood, UA: NEGATIVE
GLUCOSE UA: NEGATIVE
KETONES UA: NEGATIVE
LEUKOCYTES UA: NEGATIVE
Nitrite, UA: NEGATIVE
Protein, UA: NEGATIVE
UROBILINOGEN UA: NEGATIVE
pH, UA: 7.5

## 2015-11-14 NOTE — Progress Notes (Signed)
ROB: Patient denies complaints. 1st trimester screen normal.  For msAFP today.  To f/u in 4 weeks.  For anatomy scan at that time. Need to get records [facility].

## 2015-11-15 ENCOUNTER — Encounter: Payer: Self-pay | Admitting: Nurse Practitioner

## 2015-11-16 ENCOUNTER — Emergency Department
Admission: EM | Admit: 2015-11-16 | Discharge: 2015-11-16 | Disposition: A | Discharge status: Home / Self Care | Payer: Medicaid Other | Attending: Emergency Medicine | Admitting: Emergency Medicine

## 2015-11-16 DIAGNOSIS — G43009 Migraine without aura, not intractable, without status migrainosus: Secondary | ICD-10-CM | POA: Diagnosis not present

## 2015-11-16 DIAGNOSIS — J069 Acute upper respiratory infection, unspecified: Secondary | ICD-10-CM | POA: Diagnosis not present

## 2015-11-16 DIAGNOSIS — O98512 Other viral diseases complicating pregnancy, second trimester: Secondary | ICD-10-CM | POA: Insufficient documentation

## 2015-11-16 DIAGNOSIS — Z3A16 16 weeks gestation of pregnancy: Secondary | ICD-10-CM | POA: Insufficient documentation

## 2015-11-16 DIAGNOSIS — O26892 Other specified pregnancy related conditions, second trimester: Secondary | ICD-10-CM | POA: Insufficient documentation

## 2015-11-16 DIAGNOSIS — B349 Viral infection, unspecified: Secondary | ICD-10-CM | POA: Insufficient documentation

## 2015-11-16 DIAGNOSIS — O99512 Diseases of the respiratory system complicating pregnancy, second trimester: Secondary | ICD-10-CM | POA: Diagnosis present

## 2015-11-16 DIAGNOSIS — O99352 Diseases of the nervous system complicating pregnancy, second trimester: Secondary | ICD-10-CM | POA: Diagnosis not present

## 2015-11-16 DIAGNOSIS — N898 Other specified noninflammatory disorders of vagina: Secondary | ICD-10-CM | POA: Insufficient documentation

## 2015-11-16 LAB — RAPID INFLUENZA A&B ANTIGENS
Influenza A (ARMC): NEGATIVE
Influenza B (ARMC): NEGATIVE

## 2015-11-16 MED ORDER — SODIUM CHLORIDE 0.9 % IV BOLUS (SEPSIS): 1000.0000 mL | Freq: Once | INTRAVENOUS | Status: DC

## 2015-11-16 MED ORDER — SODIUM CHLORIDE 0.9 % IV BOLUS (SEPSIS)
1000.0000 mL | Freq: Once | INTRAVENOUS | Status: AC
  Administered 2015-11-16: 1000 mL via INTRAVENOUS

## 2015-11-16 MED ORDER — METOCLOPRAMIDE HCL 5 MG/ML IJ SOLN
INTRAMUSCULAR | Status: AC
  Filled 2015-11-16: qty 2

## 2015-11-16 MED ORDER — METOCLOPRAMIDE HCL 5 MG/ML IJ SOLN
10.0000 mg | Freq: Once | INTRAMUSCULAR | Status: AC
  Administered 2015-11-16: 10 mg via INTRAVENOUS
  Filled 2015-11-16: qty 2

## 2015-11-16 MED ORDER — POTASSIUM CHLORIDE CRYS ER 20 MEQ PO TBCR: 40.0000 meq | EXTENDED_RELEASE_TABLET | Freq: Once | ORAL | Status: DC

## 2015-11-16 NOTE — ED Provider Notes (Signed)
[facility] Emergency Department Provider Note  ____________________________________________  Time seen: Approximately 5 PM  I have reviewed the triage vital signs and the nursing notes.   HISTORY  Chief Complaint URI    HPI [name] is a 19 y.o. female at [redacted] weeks pregnant who is presenting to the emergency department today with 1 day of runny nose, cough, body aches and feeling hot. She also was vomiting earlier today and became concerned because she was not able to keep anything down. She says she has vaginal discharge but this is been consistent throughout her pregnancy and has also been evaluated by her OB/GYN. Denies any abdominal pain or cramping. No gush of fluid from the vagina. No known sick contacts.Says the symptoms started as a sore throat yesterday.   Past Medical History  Diagnosis Date  . 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 Headache     migraine- w/o aura    Patient Active Problem List   Diagnosis Date Noted  . Migraine without aura and without status migrainosus, not intractable 10/17/2015    Past Surgical History  Procedure Laterality Date  . Wisdom teeth pulled      Current Outpatient Rx  Name  Route  Sig  Dispense  Refill  . Butalbital-APAP-Caffeine 50-325-40 MG capsule   Oral   Take 1-2 capsules by mouth every 6 (six) hours as needed for headache.   30 capsule   3   . ondansetron (ZOFRAN ODT) 4 MG disintegrating tablet   Oral   Take 1 tablet (4 mg total) by mouth every 8 (eight) hours as needed for nausea or vomiting.   20 tablet   0   . Prenatal Vit-Fe Fumarate-FA (MULTIVITAMIN-PRENATAL) 27-0.8 MG TABS tablet   Oral   Take 1 tablet by mouth daily at 12 noon.           Allergies Review of patient's allergies indicates no known allergies.  Family History  Problem Relation Age of Onset  . Hypertension Father   . Diabetes Neg Hx   .  Cancer Neg Hx   . Hearing loss Neg Hx     Social History Social History  Substance Use Topics  . Smoking status: Never Smoker   . Smokeless tobacco: Never Used  . Alcohol Use: No    Review of Systems Constitutional: chills Eyes: No visual changes. ENT: No sore throat. Cardiovascular: Denies chest pain. Respiratory: Denies shortness of breath. Gastrointestinal: No abdominal pain.  No diarrhea.  No constipation. Genitourinary: Negative for dysuria. Musculoskeletal: Negative for back pain. Skin: Negative for rash. Neurological: Negative for headaches, focal weakness or numbness.  10-point ROS otherwise negative.  ____________________________________________   PHYSICAL EXAM:  VITAL SIGNS: ED Triage Vitals  Enc Vitals Group     BP 11/16/15 1559 110/77 mmHg     Pulse Rate 11/16/15 1559 111     Resp 11/16/15 1559 18     Temp 11/16/15 1559 98.2 F (36.8 C)     Temp Source 11/16/15 1559 Oral     SpO2 11/16/15 1559 97 %     Weight 11/16/15 1559 108 lb (48.988 kg)     Height 11/16/15 1559  (1.575 m)     Head Cir --      Peak Flow --      Pain Score 11/16/15 1559 5     Pain Loc --      Pain  Edu? --      Excl. in GC? --     Constitutional: Alert and oriented. Well appearing and in no acute distress. Eyes: Conjunctivae are normal. PERRL. EOMI. Head: Atraumatic. Nose: No congestion/rhinnorhea. Mouth/Throat: Mucous membranes are moist.  Oropharynx non-erythematous. Neck: No stridor.   Cardiovascular: Tachycardic, regular rhythm. Grossly normal heart sounds.  Good peripheral circulation. Respiratory: Normal respiratory effort.  No retractions. Lungs CTAB. Gastrointestinal: Soft and nontender. No distention. Uterine fundus palpable 4 cm above the pubic symphysis. No CVA tenderness. Musculoskeletal: No lower extremity tenderness nor edema.  No joint effusions. Neurologic:  Normal speech and language. No gross focal neurologic deficits are appreciated. No gait  instability. Skin:  Skin is warm, dry and intact. No rash noted. Psychiatric: Mood and affect are normal. Speech and behavior are normal.  ____________________________________________   LABS (all labs ordered are listed, but only abnormal results are displayed)  Labs Reviewed  RAPID INFLUENZA A&B ANTIGENS ([facility] ONLY)   ____________________________________________  EKG   ____________________________________________  RADIOLOGY   ____________________________________________   PROCEDURES   ____________________________________________   INITIAL IMPRESSION / ASSESSMENT AND PLAN / ED COURSE  Pertinent labs & imaging results that were available during my care of the patient were reviewed by me and considered in my medical decision making (see chart for details).  Fetal heart tones evaluated at 160 bpm.  ----------------------------------------- 7:41 PM on 11/16/2015 -----------------------------------------  Patient with symptoms relieved. Tolerating by mouth solids and fluids at this time. Heart rate still at 108 that now asymptomatic. Patient may be mildly dehydrated versus tachycardic because of pregnancy. Denies any chest pain or shortness of breath. We'll discharge to home. Patient says she has Zofran at home. Likely flulike illness. ____________________________________________   FINAL CLINICAL IMPRESSION(S) / ED DIAGNOSES  Viral syndrome.    Myrna Blazeravid Matthew Ashish Rossetti, MD 11/16/15 [phone]

## 2015-11-16 NOTE — ED Notes (Signed)
Pt c/o cough , congestion, sore throat, chills for the past 3 days.. Pt states she is [redacted] weeks pregnant..Marland Kitchen

## 2015-11-16 NOTE — Discharge Instructions (Signed)
Viral Infections °A viral infection can be caused by different types of viruses. Most viral infections are not serious and resolve on their own. However, some infections may cause severe symptoms and may lead to further complications. °SYMPTOMS °Viruses can frequently cause: °· Minor sore throat. °· Aches and pains. °· Headaches. °· Runny nose. °· Different types of rashes. °· Watery eyes. °· Tiredness. °· Cough. °· Loss of appetite. °· Gastrointestinal infections, resulting in nausea, vomiting, and diarrhea. °These symptoms do not respond to antibiotics because the infection is not caused by bacteria. However, you might catch a bacterial infection following the viral infection. This is sometimes called a "superinfection." Symptoms of such a bacterial infection may include: °· Worsening sore throat with pus and difficulty swallowing. °· Swollen neck glands. °· Chills and a high or persistent fever. °· Severe headache. °· Tenderness over the sinuses. °· Persistent overall ill feeling (malaise), muscle aches, and tiredness (fatigue). °· Persistent cough. °· Yellow, green, or brown mucus production with coughing. °HOME CARE INSTRUCTIONS  °· Only take over-the-counter or prescription medicines for pain, discomfort, diarrhea, or fever as directed by your caregiver. °· Drink enough water and fluids to keep your urine clear or pale yellow. Sports drinks can provide valuable electrolytes, sugars, and hydration. °· Get plenty of rest and maintain proper nutrition. Soups and broths with crackers or rice are fine. °SEEK IMMEDIATE MEDICAL CARE IF:  °· You have severe headaches, shortness of breath, chest pain, neck pain, or an unusual rash. °· You have uncontrolled vomiting, diarrhea, or you are unable to keep down fluids. °· You or your child has an oral temperature above 102° F (38.9° C), not controlled by medicine. °· Your baby is older than 3 months with a rectal temperature of 102° F (38.9° C) or higher. °· Your baby is 3  months old or younger with a rectal temperature of 100.4° F (38° C) or higher. °MAKE SURE YOU:  °· Understand these instructions. °· Will watch your condition. °· Will get help right away if you are not doing well or get worse. °  °This information is not intended to replace advice given to you by your health care provider. Make sure you discuss any questions you have with your health care provider. °  °Document Released: 05/01/2005 Document Revised: 10/14/2011 Document Reviewed: 12/28/2014 °Elsevier Interactive Patient Education ©2016 Elsevier Inc. ° °

## 2015-11-18 LAB — AFP, SERUM, OPEN SPINA BIFIDA
AFP MOM: 1.57
AFP VALUE AFPOSL: 67.2 ng/mL
Gest. Age on Collection Date: 16.3 weeks
MATERNAL AGE AT EDD: 21.6 a
OSBR RISK 1 IN: 2270
PDF: 0
Test Results:: NEGATIVE
WEIGHT: 108 [lb_av]

## 2015-12-06 ENCOUNTER — Other Ambulatory Visit: Payer: Self-pay

## 2015-12-06 DIAGNOSIS — J302 Other seasonal allergic rhinitis: Secondary | ICD-10-CM

## 2015-12-06 MED ORDER — LORATADINE 10 MG PO TABS: 10.0000 mg | ORAL_TABLET | Freq: Every day | ORAL | Status: DC

## 2015-12-06 MED ORDER — GUAIFENESIN ER 600 MG PO TB12: 600.0000 mg | ORAL_TABLET | Freq: Two times a day (BID) | ORAL | Status: DC

## 2015-12-06 NOTE — Progress Notes (Signed)
Pt presents to the office as a walk in pt from her PCP office. Pt states that she has a lingering cough, pt states that robitussin has been ineffective. Advised pt on short term use of mucinex and the use of Claritin. Pt gave verbal understanding. To call back if no improvement.

## 2015-12-13 ENCOUNTER — Ambulatory Visit (INDEPENDENT_AMBULATORY_CARE_PROVIDER_SITE_OTHER): Payer: Medicaid Other | Admitting: Obstetrics and Gynecology

## 2015-12-13 ENCOUNTER — Ambulatory Visit (INDEPENDENT_AMBULATORY_CARE_PROVIDER_SITE_OTHER): Payer: Medicaid Other

## 2015-12-13 VITALS — BP 103/65 | HR 85 | Wt 115.4 lb

## 2015-12-13 DIAGNOSIS — R636 Underweight: Secondary | ICD-10-CM

## 2015-12-13 DIAGNOSIS — Z3402 Encounter for supervision of normal first pregnancy, second trimester: Secondary | ICD-10-CM

## 2015-12-13 LAB — POCT URINALYSIS DIPSTICK
Bilirubin, UA: NEGATIVE
Blood, UA: NEGATIVE
Glucose, UA: NEGATIVE
KETONES UA: NEGATIVE
LEUKOCYTES UA: NEGATIVE
NITRITE UA: NEGATIVE
PH UA: 7.5
PROTEIN UA: NEGATIVE
Spec Grav, UA: <=1.005
UROBILINOGEN UA: NEGATIVE

## 2015-12-13 NOTE — Progress Notes (Signed)
ROB: Patient doing well, no complaints.  S/p normal anatomy scan today.  RTC in 4 weeks. Medicaid home pregnancy form completed today.

## 2015-12-19 ENCOUNTER — Telehealth: Payer: Self-pay | Admitting: Obstetrics and Gynecology

## 2015-12-19 NOTE — Telephone Encounter (Signed)
Called pt she states that she has been having cramping since [place]. Pt states she had a bowel movement yesterday with no difficulty. Pt denies bleeding or leaking fluid. Advised pt that some cramping in pregnancy is normal. Advised pt to increase hydration, a minimum of 8 -8oz glasses of water per day. Advised pt on drinking Gatorade or powerade. Advised pt on use of tylenol and warm baths with epsom salt for muscle relief. Pt gave verbal understanding. Pt request note for work. Advised pt that we do not generally provide work notes unless pt was seen, advised pt that I would provide note for today but not in the future.

## 2015-12-19 NOTE — Telephone Encounter (Signed)
[redacted] wks pregnant// cramping since Sunday lower abdomen

## 2016-01-10 ENCOUNTER — Ambulatory Visit (INDEPENDENT_AMBULATORY_CARE_PROVIDER_SITE_OTHER): Payer: Medicaid Other | Admitting: Obstetrics and Gynecology

## 2016-01-10 ENCOUNTER — Encounter: Payer: Self-pay | Admitting: Obstetrics and Gynecology

## 2016-01-10 VITALS — BP 108/67 | HR 91 | Wt 122.9 lb

## 2016-01-10 DIAGNOSIS — Z3482 Encounter for supervision of other normal pregnancy, second trimester: Secondary | ICD-10-CM

## 2016-01-10 DIAGNOSIS — Z3492 Encounter for supervision of normal pregnancy, unspecified, second trimester: Secondary | ICD-10-CM

## 2016-01-10 DIAGNOSIS — O2602 Excessive weight gain in pregnancy, second trimester: Secondary | ICD-10-CM

## 2016-01-10 LAB — POCT URINALYSIS DIPSTICK
Bilirubin, UA: NEGATIVE
Glucose, UA: NEGATIVE
Ketones, UA: NEGATIVE
Leukocytes, UA: NEGATIVE
NITRITE UA: NEGATIVE
PROTEIN UA: NEGATIVE
RBC UA: NEGATIVE
SPEC GRAV UA: 1.01
UROBILINOGEN UA: NEGATIVE
pH, UA: 7

## 2016-01-10 NOTE — Patient Instructions (Signed)
Glucose Tolerance Test During Pregnancy The glucose tolerance test (GTT) is a blood test used to determine if you have developed a type of diabetes during pregnancy (gestational diabetes). This is when your body does not properly process sugar (glucose) in the food you eat, resulting in high blood glucose levels. Typically, a GTT is done after you have had a 1-hour glucose test with results that indicate you possibly have gestational diabetes. It may also be done if:  You have a history of giving birth to very large babies or have experienced repeated fetal loss (stillbirth).   You have signs and symptoms of diabetes, such as:   Changes in your vision.   Tingling or numbness in your hands or feet.   Changes in hunger, thirst, and urination not otherwise explained by your pregnancy.  The GTT lasts about 3 hours. You will be given a sugar-water solution to drink at the beginning of the test. You will have blood drawn before you drink the solution and then again 1, 2, and 3 hours after you drink it. You will not be allowed to eat or drink anything else during the test. You must remain at the testing location to make sure that your blood is drawn on time. You should also avoid exercising during the test, because exercise can alter test results. PREPARATION FOR TEST  Eat normally for 3 days prior to the GTT test, including having plenty of carbohydrate-rich foods. Do not eat or drink anything except water during the final 12 hours before the test. In addition, your health care provider may ask you to stop taking certain medicines before the test. RESULTS  It is your responsibility to obtain your test results. Ask the lab or department performing the test when and how you will get your results. Contact your health care provider to discuss any questions you have about your results.  Range of Normal Values Ranges for normal values may vary among different labs and hospitals. You should always check  with your health care provider after having lab work or other tests done to discuss whether your values are considered within normal limits. Normal levels of blood glucose are as follows:  Fasting: less than 105 mg/dL.   1 hour after drinking the solution: less than 190 mg/dL.   2 hours after drinking the solution: less than 165 mg/dL.   3 hours after drinking the solution: less than 145 mg/dL.  Some substances can interfere with GTT results. These may include:  Blood pressure and heart failure medicines, including beta blockers, furosemide, and thiazides.   Anti-inflammatory medicines, including aspirin.   Nicotine.   Some psychiatric medicines.  Meaning of Results Outside Normal Value Ranges GTT test results that are above normal values may indicate a number of health problems, such as:   Gestational diabetes.   Acute stress response.   Cushing syndrome.   Tumors such as pheochromocytoma or glucagonoma.   Long-term kidney problems.   Pancreatitis.   Hyperthyroidism.   Current infection.  Discuss your test results with your health care provider. He or she will use the results to make a diagnosis and determine a treatment plan that is right for you.   This information is not intended to replace advice given to you by your health care provider. Make sure you discuss any questions you have with your health care provider.   Document Released: 01/21/2012 Document Revised: 08/12/2014 Document Reviewed: 11/26/2013 Elsevier Interactive Patient Education 2016 Elsevier Inc.  

## 2016-01-12 DIAGNOSIS — O2602 Excessive weight gain in pregnancy, second trimester: Secondary | ICD-10-CM | POA: Insufficient documentation

## 2016-01-12 DIAGNOSIS — Z3493 Encounter for supervision of normal pregnancy, unspecified, third trimester: Secondary | ICD-10-CM | POA: Insufficient documentation

## 2016-01-12 NOTE — Progress Notes (Signed)
ROB: Patient doing well, without complaints.  Counseled on excessive weight gain in pregnancy, healthy eating habits, smart food choices, exercise in pregnancy.  Advised on limiting further weight gain at this time. RTC in 4 weeks, for 28 week labs at that time.

## 2016-02-07 ENCOUNTER — Other Ambulatory Visit: Payer: Medicaid Other

## 2016-02-07 ENCOUNTER — Encounter: Payer: Self-pay | Admitting: Obstetrics and Gynecology

## 2016-02-07 ENCOUNTER — Other Ambulatory Visit: Payer: Self-pay | Admitting: Obstetrics and Gynecology

## 2016-02-07 ENCOUNTER — Ambulatory Visit (INDEPENDENT_AMBULATORY_CARE_PROVIDER_SITE_OTHER): Payer: Medicaid Other | Admitting: Obstetrics and Gynecology

## 2016-02-07 VITALS — BP 111/72 | HR 106 | Wt 125.5 lb

## 2016-02-07 DIAGNOSIS — Z36 Encounter for antenatal screening of mother: Secondary | ICD-10-CM

## 2016-02-07 DIAGNOSIS — Z23 Encounter for immunization: Secondary | ICD-10-CM

## 2016-02-07 DIAGNOSIS — Z3493 Encounter for supervision of normal pregnancy, unspecified, third trimester: Secondary | ICD-10-CM

## 2016-02-07 DIAGNOSIS — O2602 Excessive weight gain in pregnancy, second trimester: Secondary | ICD-10-CM

## 2016-02-07 DIAGNOSIS — Z3483 Encounter for supervision of other normal pregnancy, third trimester: Secondary | ICD-10-CM

## 2016-02-07 DIAGNOSIS — Z369 Encounter for antenatal screening, unspecified: Secondary | ICD-10-CM

## 2016-02-07 DIAGNOSIS — Z1389 Encounter for screening for other disorder: Secondary | ICD-10-CM

## 2016-02-07 LAB — POCT URINALYSIS DIPSTICK
BILIRUBIN UA: NEGATIVE
GLUCOSE UA: NEGATIVE
KETONES UA: NEGATIVE
Leukocytes, UA: NEGATIVE
Nitrite, UA: NEGATIVE
Protein, UA: NEGATIVE
RBC UA: NEGATIVE
SPEC GRAV UA: 1.015
UROBILINOGEN UA: NEGATIVE
pH, UA: 6

## 2016-02-07 MED ORDER — TETANUS-DIPHTH-ACELL PERTUSSIS 5-2.5-18.5 LF-MCG/0.5 IM SUSP
0.5000 mL | Freq: Once | INTRAMUSCULAR | Status: AC
  Administered 2016-02-07: 0.5 mL via INTRAMUSCULAR

## 2016-02-07 NOTE — Patient Instructions (Signed)
Tdap Vaccine (Tetanus, Diphtheria and Pertussis): What You Need to Know 1. Why get vaccinated? Tetanus, diphtheria and pertussis are very serious diseases. Tdap vaccine can protect us from these diseases. And, Tdap vaccine given to pregnant women can protect newborn babies against pertussis. TETANUS (Lockjaw) is rare in the [place] today. It causes painful muscle tightening and stiffness, usually all over the body.  It can lead to tightening of muscles in the head and neck so you can't open your mouth, swallow, or sometimes even breathe. Tetanus kills about 1 out of 10 people who are infected even after receiving the best medical care. DIPHTHERIA is also rare in the [place] today. It can cause a thick coating to form in the back of the throat.  It can lead to breathing problems, heart failure, paralysis, and death. PERTUSSIS (Whooping Cough) causes severe coughing spells, which can cause difficulty breathing, vomiting and disturbed sleep.  It can also lead to weight loss, incontinence, and rib fractures. Up to 2 in 100 adolescents and 5 in 100 adults with pertussis are hospitalized or have complications, which could include pneumonia or death. These diseases are caused by bacteria. Diphtheria and pertussis are spread from person to person through secretions from coughing or sneezing. Tetanus enters the body through cuts, scratches, or wounds. Before vaccines, as many as 200,000 cases of diphtheria, 200,000 cases of pertussis, and hundreds of cases of tetanus, were reported in the [place] each year. Since vaccination began, reports of cases for tetanus and diphtheria have dropped by about 99% and for pertussis by about 80%. 2. Tdap vaccine Tdap vaccine can protect adolescents and adults from tetanus, diphtheria, and pertussis. One dose of Tdap is routinely given at age 11 or 12. People who did not get Tdap at that age should get it as soon as possible. Tdap is especially important  for healthcare professionals and anyone having close contact with a baby younger than 12 months. Pregnant women should get a dose of Tdap during every pregnancy, to protect the newborn from pertussis. Infants are most at risk for severe, life-threatening complications from pertussis. Another vaccine, called Td, protects against tetanus and diphtheria, but not pertussis. A Td booster should be given every 10 years. Tdap may be given as one of these boosters if you have never gotten Tdap before. Tdap may also be given after a severe cut or burn to prevent tetanus infection. Your doctor or the person giving you the vaccine can give you more information. Tdap may safely be given at the same time as other vaccines. 3. Some people should not get this vaccine  A person who has ever had a life-threatening allergic reaction after a previous dose of any diphtheria, tetanus or pertussis containing vaccine, OR has a severe allergy to any part of this vaccine, should not get Tdap vaccine. Tell the person giving the vaccine about any severe allergies.  Anyone who had coma or long repeated seizures within 7 days after a childhood dose of DTP or DTaP, or a previous dose of Tdap, should not get Tdap, unless a cause other than the vaccine was found. They can still get Td.  Talk to your doctor if you:  have seizures or another nervous system problem,  had severe pain or swelling after any vaccine containing diphtheria, tetanus or pertussis,  ever had a condition called Guillain-Barr Syndrome (GBS),  aren't feeling well on the day the shot is scheduled. 4. Risks With any medicine, including vaccines, there is   a chance of side effects. These are usually mild and go away on their own. Serious reactions are also possible but are rare. Most people who get Tdap vaccine do not have any problems with it. Mild problems following Tdap (Did not interfere with activities)  Pain where the shot was given (about 3 in 4  adolescents or 2 in 3 adults)  Redness or swelling where the shot was given (about 1 person in 5)  Mild fever of at least 100.4F (up to about 1 in 25 adolescents or 1 in 100 adults)  Headache (about 3 or 4 people in 10)  Tiredness (about 1 person in 3 or 4)  Nausea, vomiting, diarrhea, stomach ache (up to 1 in 4 adolescents or 1 in 10 adults)  Chills, sore joints (about 1 person in 10)  Body aches (about 1 person in 3 or 4)  Rash, swollen glands (uncommon) Moderate problems following Tdap (Interfered with activities, but did not require medical attention)  Pain where the shot was given (up to 1 in 5 or 6)  Redness or swelling where the shot was given (up to about 1 in 16 adolescents or 1 in 12 adults)  Fever over 102F (about 1 in 100 adolescents or 1 in 250 adults)  Headache (about 1 in 7 adolescents or 1 in 10 adults)  Nausea, vomiting, diarrhea, stomach ache (up to 1 or 3 people in 100)  Swelling of the entire arm where the shot was given (up to about 1 in 500). Severe problems following Tdap (Unable to perform usual activities; required medical attention)  Swelling, severe pain, bleeding and redness in the arm where the shot was given (rare). Problems that could happen after any vaccine:  People sometimes faint after a medical procedure, including vaccination. Sitting or lying down for about 15 minutes can help prevent fainting, and injuries caused by a fall. Tell your doctor if you feel dizzy, or have vision changes or ringing in the ears.  Some people get severe pain in the shoulder and have difficulty moving the arm where a shot was given. This happens very rarely.  Any medication can cause a severe allergic reaction. Such reactions from a vaccine are very rare, estimated at fewer than 1 in a million doses, and would happen within a few minutes to a few hours after the vaccination. As with any medicine, there is a very remote chance of a vaccine causing a serious  injury or death. The safety of vaccines is always being monitored. For more information, visit: www.cdc.gov/vaccinesafety/ 5. What if there is a serious problem? What should I look for?  Look for anything that concerns you, such as signs of a severe allergic reaction, very high fever, or unusual behavior.  Signs of a severe allergic reaction can include hives, swelling of the face and throat, difficulty breathing, a fast heartbeat, dizziness, and weakness. These would usually start a few minutes to a few hours after the vaccination. What should I do?  If you think it is a severe allergic reaction or other emergency that can't wait, call 9-1-1 or get the person to the nearest hospital. Otherwise, call your doctor.  Afterward, the reaction should be reported to the Vaccine Adverse Event Reporting System (VAERS). Your doctor might file this report, or you can do it yourself through the VAERS web site at www.vaers.hhs.gov, or by calling 1-800-822-7967. VAERS does not give medical advice.  6. The National Vaccine Injury Compensation Program The National Vaccine Injury Compensation Program (  VICP) is a federal program that was created to compensate people who may have been injured by certain vaccines. Persons who believe they may have been injured by a vaccine can learn about the program and about filing a claim by calling 1-800-338-2382 or visiting the VICP website at www.hrsa.gov/vaccinecompensation. There is a time limit to file a claim for compensation. 7. How can I learn more?  Ask your doctor. He or she can give you the vaccine package insert or suggest other sources of information.  Call your local or state health department.  Contact the Centers for Disease Control and Prevention (CDC):  Call 1-800-232-4636 (1-800-CDC-INFO) or  Visit CDC's website at www.cdc.gov/vaccines CDC Tdap Vaccine VIS (09/28/13)   This information is not intended to replace advice given to you by your health care  provider. Make sure you discuss any questions you have with your health care provider.   Document Released: 01/21/2012 Document Revised: 08/12/2014 Document Reviewed: 11/03/2013 Elsevier Interactive Patient Education 2016 Elsevier Inc.  

## 2016-02-07 NOTE — Progress Notes (Signed)
ROB: Doing better with weight gain.  Notes occasional cramping.  Reports occasional SOB.  Needs letter for work reducing to 6 days per week.  Received Tdap, discussed cord blood banking.  Signed blood consents.  Desires to breastfeed.  Unsure regarding future contraception. RTC in 2-3 weeks.

## 2016-02-08 LAB — CBC WITH DIFFERENTIAL/PLATELET
BASOS: 0 %
Basophils Absolute: 0 10*3/uL (ref 0.0–0.2)
EOS (ABSOLUTE): 0.1 10*3/uL (ref 0.0–0.4)
EOS: 1 %
HEMATOCRIT: 31.1 % — AB (ref 34.0–46.6)
Hemoglobin: 10.5 g/dL — ABNORMAL LOW (ref 11.1–15.9)
IMMATURE GRANS (ABS): 0.1 10*3/uL (ref 0.0–0.1)
IMMATURE GRANULOCYTES: 1 %
LYMPHS: 15 %
Lymphocytes Absolute: 1.7 10*3/uL (ref 0.7–3.1)
MCH: 27.9 pg (ref 26.6–33.0)
MCHC: 33.8 g/dL (ref 31.5–35.7)
MCV: 83 fL (ref 79–97)
Monocytes Absolute: 0.6 10*3/uL (ref 0.1–0.9)
Monocytes: 6 %
NEUTROS PCT: 77 %
Neutrophils Absolute: 9.2 10*3/uL — ABNORMAL HIGH (ref 1.4–7.0)
PLATELETS: 290 10*3/uL (ref 150–379)
RBC: 3.76 x10E6/uL — ABNORMAL LOW (ref 3.77–5.28)
RDW: 13.2 % (ref 12.3–15.4)
WBC: 11.7 10*3/uL — ABNORMAL HIGH (ref 3.4–10.8)

## 2016-02-08 LAB — GLUCOSE TOLERANCE, 1 HOUR: Glucose, 1Hr PP: 107 mg/dL (ref 65–199)

## 2016-02-12 ENCOUNTER — Telehealth: Payer: Self-pay

## 2016-02-12 ENCOUNTER — Emergency Department
Admission: EM | Admit: 2016-02-12 | Discharge: 2016-02-12 | Disposition: A | Discharge status: Home / Self Care | Payer: Medicaid Other | Attending: Emergency Medicine | Admitting: Emergency Medicine

## 2016-02-12 ENCOUNTER — Encounter: Payer: Self-pay | Admitting: Emergency Medicine

## 2016-02-12 ENCOUNTER — Emergency Department: Payer: Medicaid Other

## 2016-02-12 DIAGNOSIS — R0600 Dyspnea, unspecified: Secondary | ICD-10-CM | POA: Diagnosis not present

## 2016-02-12 DIAGNOSIS — R0602 Shortness of breath: Secondary | ICD-10-CM | POA: Diagnosis present

## 2016-02-12 LAB — CBC WITH DIFFERENTIAL/PLATELET
BASOS PCT: 1 %
Basophils Absolute: 0.1 10*3/uL (ref 0–0.1)
EOS ABS: 0.1 10*3/uL (ref 0–0.7)
Eosinophils Relative: 1 %
HCT: 32.8 % — ABNORMAL LOW (ref 35.0–47.0)
HEMOGLOBIN: 11.1 g/dL — AB (ref 12.0–16.0)
Lymphocytes Relative: 13 %
Lymphs Abs: 1.6 10*3/uL (ref 1.0–3.6)
MCH: 28.1 pg (ref 26.0–34.0)
MCHC: 34 g/dL (ref 32.0–36.0)
MCV: 82.8 fL (ref 80.0–100.0)
MONO ABS: 1 10*3/uL — AB (ref 0.2–0.9)
MONOS PCT: 8 %
NEUTROS PCT: 77 %
Neutro Abs: 9.8 10*3/uL — ABNORMAL HIGH (ref 1.4–6.5)
PLATELETS: 269 10*3/uL (ref 150–440)
RBC: 3.96 MIL/uL (ref 3.80–5.20)
RDW: 13 % (ref 11.5–14.5)
WBC: 12.6 10*3/uL — ABNORMAL HIGH (ref 3.6–11.0)

## 2016-02-12 LAB — COMPREHENSIVE METABOLIC PANEL
ALBUMIN: 3.1 g/dL — AB (ref 3.5–5.0)
ALT: 14 U/L (ref 14–54)
ANION GAP: 8 (ref 5–15)
AST: 15 U/L (ref 15–41)
Alkaline Phosphatase: 117 U/L (ref 38–126)
BUN: 8 mg/dL (ref 6–20)
CHLORIDE: 105 mmol/L (ref 101–111)
CO2: 26 mmol/L (ref 22–32)
Calcium: 9.2 mg/dL (ref 8.9–10.3)
Creatinine, Ser: 0.55 mg/dL (ref 0.44–1.00)
GFR calc Af Amer: >60 mL/min (ref >60)
GFR calc non Af Amer: >60 mL/min (ref >60)
GLUCOSE: 113 mg/dL — AB (ref 65–99)
POTASSIUM: 3 mmol/L — AB (ref 3.5–5.1)
SODIUM: 139 mmol/L (ref 135–145)
Total Bilirubin: <0.1 mg/dL — ABNORMAL LOW (ref 0.3–1.2)
Total Protein: 7.5 g/dL (ref 6.5–8.1)

## 2016-02-12 LAB — TROPONIN I: Troponin I: <0.03 ng/mL (ref <0.03)

## 2016-02-12 MED ORDER — IOPAMIDOL (ISOVUE-370) INJECTION 76%
100.0000 mL | Freq: Once | INTRAVENOUS | Status: AC | PRN
  Administered 2016-02-12: 100 mL via INTRAVENOUS
  Filled 2016-02-12: qty 100

## 2016-02-12 NOTE — ED Notes (Signed)
Pt discharged to home.  Family member driving.  Discharge instructions reviewed.  Verbalized understanding.  No questions or concerns at this time.  Teach back verified.  Pt in NAD.  No items left in ED.   

## 2016-02-12 NOTE — ED Notes (Signed)
[redacted] weeks pregnant with feeling of SOB, hard to take a deep breath, sometimes feels like heart racing x 2 weeks. Denies contractions, vag bleed or leaking.

## 2016-02-12 NOTE — Telephone Encounter (Signed)
Pt calls and states she is having chest pain and SOB, advised pt to seek care in Ed immediatly.

## 2016-02-12 NOTE — ED Provider Notes (Signed)
[facility] Emergency Department Provider Note  Time seen: 6:19 PM  I have reviewed the triage vital signs and the nursing notes.   HISTORY  Chief Complaint Shortness of Breath    HPI Marcia Hodges is a 20 y.o. female G1 P0 at [redacted] weeks pregnant who presents to the emergency department with chest pain and shortness breath. According to the patient for the past 2 weeks she has been experiencing shortness of breath, sensation that she cannot get a deep breath of air in. She states for the past few days she is now having central chest pain worse with deep inspiration. Denies any leg pain or swelling. Denies any history of DVT or blood clot. This is the patient's first pregnancy. Describes the chest discomfort is mild fairly constantly with moderate pain with deep inspiration. Denies cough or congestion. Denies fever.     Past Medical History  Diagnosis Date  . 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 Headache     migraine- w/o aura    Patient Active Problem List   Diagnosis Date Noted  . Excessive weight gain during pregnancy in second trimester 01/12/2016  . Supervision of normal pregnancy in third trimester 01/12/2016  . Underweight 12/13/2015  . Migraine without aura and without status migrainosus, not intractable 10/17/2015    Past Surgical History  Procedure Laterality Date  . Wisdom teeth pulled      Current Outpatient Rx  Name  Route  Sig  Dispense  Refill  . Butalbital-APAP-Caffeine 50-325-40 MG capsule   Oral   Take 1-2 capsules by mouth every 6 (six) hours as needed for headache.   30 capsule   3   . loratadine (CLARITIN) 10 MG tablet   Oral   Take 1 tablet (10 mg total) by mouth daily.   30 tablet   0   . Prenatal Vit-Fe Fumarate-FA (MULTIVITAMIN-PRENATAL) 27-0.8 MG TABS tablet   Oral   Take 1 tablet by mouth daily at 12 noon.           Allergies Review of  patient's allergies indicates no known allergies.  Family History  Problem Relation Age of Onset  . Hypertension Father   . Diabetes Neg Hx   . Cancer Neg Hx   . Hearing loss Neg Hx     Social History Social History  Substance Use Topics  . Smoking status: Never Smoker   . Smokeless tobacco: Never Used  . Alcohol Use: No    Review of Systems Constitutional: Negative for fever. Cardiovascular: Positive for chest pain with deep inspiration. Respiratory: Positive for shortness of breath. Gastrointestinal: Negative for abdominal pain Musculoskeletal: Negative for back pain. Neurological: Negative for headache 10-point ROS otherwise negative.  ____________________________________________   PHYSICAL EXAM:  VITAL SIGNS: ED Triage Vitals  Enc Vitals Group     BP 02/12/16 1655 120/70 mmHg     Pulse Rate 02/12/16 1655 101     Resp 02/12/16 1655 20     Temp 02/12/16 1655 97.8 F (36.6 C)     Temp Source 02/12/16 1655 Oral     SpO2 02/12/16 1655 100 %     Weight 02/12/16 1655 125 lb (56.7 kg)     Height 02/12/16 1655  (1.575 m)     Head Cir --      Peak Flow --      Pain Score --  Pain Loc --      Pain Edu? --      Excl. in GC? --     Constitutional: Alert and oriented. Well appearing and in no distress. Eyes: Normal exam ENT   Head: Normocephalic and atraumatic.Marland Kitchen.   Mouth/Throat: Mucous membranes are moist. Cardiovascular: Normal rate, regular rhythm. No murmur Respiratory: Normal respiratory effort without tachypnea nor retractions. Breath sounds are clear  Gastrointestinal: Soft and nontender. No distention.   Musculoskeletal: Nontender with normal range of motion in all extremities. No lower extremity tenderness or edema. Neurologic:  Normal speech and language. No gross focal neurologic deficits  Skin:  Skin is warm, dry and intact.  Psychiatric: Mood and affect are normal.   ____________________________________________    EKG  EKG reviewed  and interpreted by myself shows sinus tachycardia 106 bpm, narrow QRS, normal axis, normal intervals, nonspecific ST changes without ST elevation.  ____________________________________________    RADIOLOGY  CT angiography of the chest is negative.  ____________________________________________    INITIAL IMPRESSION / ASSESSMENT AND PLAN / ED COURSE  Pertinent labs & imaging results that were available during my care of the patient were reviewed by me and considered in my medical decision making (see chart for details).  The patient presents the emergency department [redacted] weeks pregnant with approximately 2 weeks of shortness of breath and one week of pleuritic chest pain. Patient has no lower extremity edema or tenderness. Chest pain is not reproducible on exam. Given the patient's pleuritic nature of her chest pain with mild tachycardia we'll proceed with a CT angiography of the chest to rule out pulmonary emboli. I discussed with the patient risk and benefit, patient is agreeable to plan with abdominal shielding.  Labs are largely within normal limits. Negative troponin. Negative CT angiography of the chest. We'll discharge the patient home with OB/GYN follow-up. Suspect the difficulty breathing is likely due to her third trimester pregnancy.  ____________________________________________   FINAL CLINICAL IMPRESSION(S) / ED DIAGNOSES  Dyspnea Chest pain   Marcia AntisKevin Danta Baumgardner, MD 02/12/16 1843

## 2016-02-12 NOTE — Discharge Instructions (Signed)

## 2016-02-28 ENCOUNTER — Telehealth: Payer: Self-pay | Admitting: Obstetrics and Gynecology

## 2016-02-28 ENCOUNTER — Ambulatory Visit (INDEPENDENT_AMBULATORY_CARE_PROVIDER_SITE_OTHER): Payer: Medicaid Other | Admitting: Obstetrics and Gynecology

## 2016-02-28 VITALS — BP 116/70 | HR 95 | Wt 128.4 lb

## 2016-02-28 DIAGNOSIS — O9989 Other specified diseases and conditions complicating pregnancy, childbirth and the puerperium: Secondary | ICD-10-CM

## 2016-02-28 DIAGNOSIS — R109 Unspecified abdominal pain: Secondary | ICD-10-CM

## 2016-02-28 DIAGNOSIS — O26899 Other specified pregnancy related conditions, unspecified trimester: Secondary | ICD-10-CM

## 2016-02-28 LAB — POCT URINALYSIS DIPSTICK
Bilirubin, UA: NEGATIVE
Blood, UA: NEGATIVE
GLUCOSE UA: NEGATIVE
KETONES UA: NEGATIVE
Leukocytes, UA: NEGATIVE
Nitrite, UA: NEGATIVE
PROTEIN UA: NEGATIVE
SPEC GRAV UA: 1.01
Urobilinogen, UA: NEGATIVE
pH, UA: 6.5

## 2016-02-28 NOTE — Telephone Encounter (Signed)
Called pt she states that she has been having persistent cramping since Sunday, pt states that she has tried tylenol without relief. Denies bleeding or leaking fluid. Pt does note that she did a lot of walking over the weekend. Also c/o decreased fetal movement. Advised pt to come in for evaluation. Pt added to schedule for 3pm.

## 2016-02-28 NOTE — Telephone Encounter (Signed)
Pt called and she is [redacted] weeks pregnant and since Sunday she has had lower abdominal cramping going around to her back, no bleeding, baby has been moving good but she said he hasn't moved a lot this morning.

## 2016-02-28 NOTE — Progress Notes (Signed)
Problem OB: Patient c/o decreased FM x 1 day.  Notes fetus is usually very active upon waking in a.m. but today was not.  Patient attempted to eat, however still did not note movement.  NST performed today was reviewed and was found to be reactive.  Fetal movements noted on tracing.  Continue recommended antenatal testing and prenatal care. Discussed fetal kick counts.  Handout given. RTC for routine appt in 1 week.    NONSTRESS TEST INTERPRETATION  INDICATIONS: Decreased fetal movement  FHR baseline: 140 bpm RESULTS:Reactive COMMENTS: occasional ctx   PLAN: 1. Continue fetal kick counts twice a day. 2. Continue routine prenatal care as scheduled

## 2016-02-28 NOTE — Patient Instructions (Signed)
Fetal Movement Counts  Patient Name: __________________________________________________ Patient Due Date: ____________________  Performing a fetal movement count is highly recommended in high-risk pregnancies, but it is good for every pregnant woman to do. Your health care provider may ask you to start counting fetal movements at 28 weeks of the pregnancy. Fetal movements often increase:  · After eating a full meal.  · After physical activity.  · After eating or drinking something sweet or cold.  · At rest.  Pay attention to when you feel the baby is most active. This will help you notice a pattern of your baby's sleep and wake cycles and what factors contribute to an increase in fetal movement. It is important to perform a fetal movement count at the same time each day when your baby is normally most active.   HOW TO COUNT FETAL MOVEMENTS  1. Find a quiet and comfortable area to sit or lie down on your left side. Lying on your left side provides the best blood and oxygen circulation to your baby.  2. Write down the day and time on a sheet of paper or in a journal.  3. Start counting kicks, flutters, swishes, rolls, or jabs in a 2-hour period. You should feel at least 10 movements within 2 hours.  4. If you do not feel 10 movements in 2 hours, wait 2-3 hours and count again. Look for a change in the pattern or not enough counts in 2 hours.  SEEK MEDICAL CARE IF:  · You feel less than 10 counts in 2 hours, tried twice.  · There is no movement in over an hour.  · The pattern is changing or taking longer each day to reach 10 counts in 2 hours.  · You feel the baby is not moving as he or she usually does.  Date: ____________ Movements: ____________ Start time: ____________ Finish time: ____________   Date: ____________ Movements: ____________ Start time: ____________ Finish time: ____________  Date: ____________ Movements: ____________ Start time: ____________ Finish time: ____________  Date: ____________ Movements:  ____________ Start time: ____________ Finish time: ____________  Date: ____________ Movements: ____________ Start time: ____________ Finish time: ____________  Date: ____________ Movements: ____________ Start time: ____________ Finish time: ____________  Date: ____________ Movements: ____________ Start time: ____________ Finish time: ____________  Date: ____________ Movements: ____________ Start time: ____________ Finish time: ____________   Date: ____________ Movements: ____________ Start time: ____________ Finish time: ____________  Date: ____________ Movements: ____________ Start time: ____________ Finish time: ____________  Date: ____________ Movements: ____________ Start time: ____________ Finish time: ____________  Date: ____________ Movements: ____________ Start time: ____________ Finish time: ____________  Date: ____________ Movements: ____________ Start time: ____________ Finish time: ____________  Date: ____________ Movements: ____________ Start time: ____________ Finish time: ____________  Date: ____________ Movements: ____________ Start time: ____________ Finish time: ____________   Date: ____________ Movements: ____________ Start time: ____________ Finish time: ____________  Date: ____________ Movements: ____________ Start time: ____________ Finish time: ____________  Date: ____________ Movements: ____________ Start time: ____________ Finish time: ____________  Date: ____________ Movements: ____________ Start time: ____________ Finish time: ____________  Date: ____________ Movements: ____________ Start time: ____________ Finish time: ____________  Date: ____________ Movements: ____________ Start time: ____________ Finish time: ____________  Date: ____________ Movements: ____________ Start time: ____________ Finish time: ____________   Date: ____________ Movements: ____________ Start time: ____________ Finish time: ____________  Date: ____________ Movements: ____________ Start time: ____________ Finish  time: ____________  Date: ____________ Movements: ____________ Start time: ____________ Finish time: ____________  Date: ____________ Movements: ____________ Start time:   ____________ Finish time: ____________  Date: ____________ Movements: ____________ Start time: ____________ Finish time: ____________  Date: ____________ Movements: ____________ Start time: ____________ Finish time: ____________  Date: ____________ Movements: ____________ Start time: ____________ Finish time: ____________   Date: ____________ Movements: ____________ Start time: ____________ Finish time: ____________  Date: ____________ Movements: ____________ Start time: ____________ Finish time: ____________  Date: ____________ Movements: ____________ Start time: ____________ Finish time: ____________  Date: ____________ Movements: ____________ Start time: ____________ Finish time: ____________  Date: ____________ Movements: ____________ Start time: ____________ Finish time: ____________  Date: ____________ Movements: ____________ Start time: ____________ Finish time: ____________  Date: ____________ Movements: ____________ Start time: ____________ Finish time: ____________   Date: ____________ Movements: ____________ Start time: ____________ Finish time: ____________  Date: ____________ Movements: ____________ Start time: ____________ Finish time: ____________  Date: ____________ Movements: ____________ Start time: ____________ Finish time: ____________  Date: ____________ Movements: ____________ Start time: ____________ Finish time: ____________  Date: ____________ Movements: ____________ Start time: ____________ Finish time: ____________  Date: ____________ Movements: ____________ Start time: ____________ Finish time: ____________  Date: ____________ Movements: ____________ Start time: ____________ Finish time: ____________   Date: ____________ Movements: ____________ Start time: ____________ Finish time: ____________  Date: ____________  Movements: ____________ Start time: ____________ Finish time: ____________  Date: ____________ Movements: ____________ Start time: ____________ Finish time: ____________  Date: ____________ Movements: ____________ Start time: ____________ Finish time: ____________  Date: ____________ Movements: ____________ Start time: ____________ Finish time: ____________  Date: ____________ Movements: ____________ Start time: ____________ Finish time: ____________  Date: ____________ Movements: ____________ Start time: ____________ Finish time: ____________   Date: ____________ Movements: ____________ Start time: ____________ Finish time: ____________  Date: ____________ Movements: ____________ Start time: ____________ Finish time: ____________  Date: ____________ Movements: ____________ Start time: ____________ Finish time: ____________  Date: ____________ Movements: ____________ Start time: ____________ Finish time: ____________  Date: ____________ Movements: ____________ Start time: ____________ Finish time: ____________  Date: ____________ Movements: ____________ Start time: ____________ Finish time: ____________     This information is not intended to replace advice given to you by your health care provider. Make sure you discuss any questions you have with your health care provider.     Document Released: 08/21/2006 Document Revised: 08/12/2014 Document Reviewed: 05/18/2012  Elsevier Interactive Patient Education ©2016 Elsevier Inc.

## 2016-03-06 ENCOUNTER — Ambulatory Visit (INDEPENDENT_AMBULATORY_CARE_PROVIDER_SITE_OTHER): Payer: Medicaid Other | Admitting: Obstetrics and Gynecology

## 2016-03-06 ENCOUNTER — Encounter: Payer: Self-pay | Admitting: Obstetrics and Gynecology

## 2016-03-06 VITALS — BP 98/62 | HR 89 | Wt 128.9 lb

## 2016-03-06 DIAGNOSIS — Z3403 Encounter for supervision of normal first pregnancy, third trimester: Secondary | ICD-10-CM

## 2016-03-06 LAB — POCT URINALYSIS DIPSTICK
BILIRUBIN UA: NEGATIVE
Blood, UA: NEGATIVE
Glucose, UA: NEGATIVE
KETONES UA: NEGATIVE
LEUKOCYTES UA: NEGATIVE
NITRITE UA: NEGATIVE
Protein, UA: NEGATIVE
Spec Grav, UA: <=1.005
Urobilinogen, UA: 0.2
pH, UA: 6.5

## 2016-03-06 NOTE — Progress Notes (Signed)
ROB: Doing well, denies complaints.  Reiterated fetal kick counts. Desires OCPs for contraception.  RTC in 2 weeks. Continue to caution on further weight gain (TWG 38 lbs).

## 2016-03-07 ENCOUNTER — Encounter: Payer: Self-pay | Admitting: Nurse Practitioner

## 2016-03-20 ENCOUNTER — Encounter: Payer: Self-pay | Admitting: Obstetrics and Gynecology

## 2016-03-20 ENCOUNTER — Ambulatory Visit (INDEPENDENT_AMBULATORY_CARE_PROVIDER_SITE_OTHER): Payer: Medicaid Other | Admitting: Obstetrics and Gynecology

## 2016-03-20 VITALS — BP 99/62 | HR 82 | Wt 131.1 lb

## 2016-03-20 DIAGNOSIS — Z3483 Encounter for supervision of other normal pregnancy, third trimester: Secondary | ICD-10-CM

## 2016-03-20 DIAGNOSIS — Z3493 Encounter for supervision of normal pregnancy, unspecified, third trimester: Secondary | ICD-10-CM

## 2016-03-20 DIAGNOSIS — O2602 Excessive weight gain in pregnancy, second trimester: Secondary | ICD-10-CM

## 2016-03-20 LAB — POCT URINALYSIS DIPSTICK
BILIRUBIN UA: NEGATIVE
GLUCOSE UA: NEGATIVE
KETONES UA: NEGATIVE
Leukocytes, UA: NEGATIVE
Nitrite, UA: NEGATIVE
PH UA: 7.5
Protein, UA: NEGATIVE
RBC UA: NEGATIVE
SPEC GRAV UA: 1.01
Urobilinogen, UA: 0.2

## 2016-03-20 NOTE — Progress Notes (Signed)
ROB: Patient doing well.  Does note some intermittent lower abdominal pain, mostly when walking.    Advised on Tylenol, warm baths.  RTC in 2 weeks.

## 2016-03-26 ENCOUNTER — Telehealth: Payer: Self-pay | Admitting: Obstetrics and Gynecology

## 2016-03-26 NOTE — Telephone Encounter (Signed)
[redacted] wks pregnant / nose bleed . She was worried what was wrong. Will you call her

## 2016-03-27 NOTE — Telephone Encounter (Signed)
Called pt she states that she has had a nosebleed 2 days consecutively. Pt states that her job is very hot (states that she is planning to take leave from this job this week) Pt states that nose bleed lasted 1-2 mins.. Advised pt on cool compresses to the back of the neck, cool mist humidifier, and Vaseline in the nose. Pt gave verbal understanding.

## 2016-03-28 LAB — OB RESULTS CONSOLE GBS: GBS: POSITIVE

## 2016-04-10 ENCOUNTER — Ambulatory Visit (INDEPENDENT_AMBULATORY_CARE_PROVIDER_SITE_OTHER): Payer: Medicaid Other | Admitting: Obstetrics and Gynecology

## 2016-04-10 VITALS — BP 114/69 | HR 85 | Wt 132.9 lb

## 2016-04-10 DIAGNOSIS — Z124 Encounter for screening for malignant neoplasm of cervix: Secondary | ICD-10-CM

## 2016-04-10 DIAGNOSIS — Z3685 Encounter for antenatal screening for Streptococcus B: Secondary | ICD-10-CM

## 2016-04-10 DIAGNOSIS — Z3493 Encounter for supervision of normal pregnancy, unspecified, third trimester: Secondary | ICD-10-CM

## 2016-04-10 DIAGNOSIS — Z113 Encounter for screening for infections with a predominantly sexual mode of transmission: Secondary | ICD-10-CM

## 2016-04-10 DIAGNOSIS — Z36 Encounter for antenatal screening of mother: Secondary | ICD-10-CM

## 2016-04-10 DIAGNOSIS — Z3483 Encounter for supervision of other normal pregnancy, third trimester: Secondary | ICD-10-CM

## 2016-04-10 LAB — POCT URINALYSIS DIPSTICK
BILIRUBIN UA: NEGATIVE
GLUCOSE UA: NEGATIVE
KETONES UA: NEGATIVE
NITRITE UA: NEGATIVE
PH UA: 6.5
Protein, UA: NEGATIVE
RBC UA: NEGATIVE
Spec Grav, UA: 1.025
Urobilinogen, UA: NEGATIVE

## 2016-04-10 NOTE — Progress Notes (Signed)
ROB: Doing well, no complaints. Notes Marcia PeltonBraxton Hicks. Labor precautions given. 36 week labs done.  Pap done today. RTC in 1 week.

## 2016-04-11 LAB — GC/CHLAMYDIA PROBE AMP
Chlamydia trachomatis, NAA: NEGATIVE
NEISSERIA GONORRHOEAE BY PCR: NEGATIVE

## 2016-04-13 LAB — CULTURE, BETA STREP (GROUP B ONLY): Strep Gp B Culture: POSITIVE — AB

## 2016-04-15 ENCOUNTER — Observation Stay
Admission: EM | Admit: 2016-04-15 | Discharge: 2016-04-15 | Disposition: A | Discharge status: Home / Self Care | Payer: Medicaid Other | Attending: Obstetrics and Gynecology | Admitting: Obstetrics and Gynecology

## 2016-04-15 DIAGNOSIS — Z3403 Encounter for supervision of normal first pregnancy, third trimester: Principal | ICD-10-CM | POA: Insufficient documentation

## 2016-04-15 DIAGNOSIS — O479 False labor, unspecified: Secondary | ICD-10-CM | POA: Diagnosis present

## 2016-04-15 DIAGNOSIS — Z3A39 39 weeks gestation of pregnancy: Secondary | ICD-10-CM | POA: Diagnosis not present

## 2016-04-15 NOTE — Final Progress Note (Signed)
L&D OB Triage Note  HPI:  [name] is a 20 y.o. G1P0000 female at 4696w1d. Estimated Date of Delivery: 04/28/16 who presents for complaints of abdominal pain x 4 hours, possibly contractions.  Pain currently 4-5 out of 10, comes and goes ever 5-7 minutes. Denies vaginal bleeding, LOF, and notes good FM.    OB History  Gravida Para Term Preterm AB Living  1 0 0 0 0 0  SAB TAB Ectopic Multiple Live Births  0 0 0 0      # Outcome Date GA Lbr Len/2nd Weight Sex Delivery Anes PTL Lv  1 Current               Patient Active Problem List   Diagnosis Date Noted  . Excessive weight gain during pregnancy in second trimester 01/12/2016  . Supervision of normal pregnancy in third trimester 01/12/2016  . Underweight 12/13/2015  . Migraine without aura and without status migrainosus, not intractable 10/17/2015    Past Medical History:  Diagnosis Date  . 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 Headache    migraine- w/o aura    No current facility-administered medications on file prior to encounter.    Current Outpatient Prescriptions on File Prior to Encounter  Medication Sig Dispense Refill  . Butalbital-APAP-Caffeine 50-325-40 MG capsule Take 1-2 capsules by mouth every 6 (six) hours as needed for headache. 30 capsule 3  . Prenatal Vit-Fe Fumarate-FA (MULTIVITAMIN-PRENATAL) 27-0.8 MG TABS tablet Take 1 tablet by mouth daily at 12 noon.    . loratadine (CLARITIN) 10 MG tablet Take 1 tablet (10 mg total) by mouth daily. (Patient not taking: Reported on 03/20/2016) 30 tablet 0    No Known Allergies   ROS:  Review of Systems - Negative except what's noted in HPI   Physical Exam:  Blood pressure 128/77, pulse 82, temperature 97.6 F (36.4 C), temperature source Oral, resp. rate 16, height 5\' 2"  (1.575 m), weight 135 lb (61.2 kg), last menstrual period 07/23/2015. General appearance: alert and no distress Abdomen:  soft, non-tender; bowel sounds normal; no masses,  no organomegaly and gravid Pelvic: 2/5/c/-2 Extremities: extremities normal, atraumatic, no cyanosis or edema   NST INTERPRETATION: Indications: rule out uterine contractions  Mode: External Baseline Rate (A): 130 bpm Variability: Moderate Accelerations: 15 x 15 Decelerations: None     Contraction Frequency (min): 2-3  Impression: reactive   Assessment:  20 y.o. G1P0000 at 4596w1d with:  1.  Contractions, ruled out for labor   Plan:  1. Given labor precautions 2. Can take Tylenol prn or warm baths for pain 3. F/u with routine prenatal care as scheduled.   Hildred LaserAnika Weston Kallman, MD [facility]

## 2016-04-15 NOTE — OB Triage Note (Signed)
[facility] with c/o abdominal pain for the past 4 hours, was 5-7 mins apart 2 hours ago, feels like contractions are coming q4-5 mins in the past hour,  Rates pain about 4-5/10, confirms active fetal movt, denies leaking or gush of fluid, spotting, vaginal bleeding, n/v/d or urinary symptoms . Called OB clinic and spoke with on call RN, was told to come to hosp to be checked. Last seen by OB on Wed, 9/6, was 2 cm dilated. GBS pos. Expecting baby boy, [facility], plans for IV pain med and Epidural for labor pain.

## 2016-04-15 NOTE — Progress Notes (Signed)
FHT remains reactive, pt remains calm, still having contractions, rates discomfort 4-5/10. Pt agrees they feel the same and have spaced out some [facility]. Cervix remains unchanged from initial exam. Discharge teaching with labor precautions and handouts reviewed with pt, s/o and family present. Encouraged pt to drink plenty fluids throughout the day and night to stay well hydrated. Avoid strenuous activity, rest, warm shower, lying on left side, and otc Tylenol.

## 2016-04-16 LAB — PAP IG, CT-NG, RFX HPV ASCU
CHLAMYDIA, NUC. ACID AMP: NEGATIVE
GONOCOCCUS BY NUCLEIC ACID AMP: NEGATIVE
PAP Smear Comment: 0

## 2016-04-24 ENCOUNTER — Ambulatory Visit (INDEPENDENT_AMBULATORY_CARE_PROVIDER_SITE_OTHER): Payer: Medicaid Other | Admitting: Obstetrics and Gynecology

## 2016-04-24 VITALS — BP 116/78 | HR 84 | Wt 136.7 lb

## 2016-04-24 DIAGNOSIS — Z3493 Encounter for supervision of normal pregnancy, unspecified, third trimester: Secondary | ICD-10-CM

## 2016-04-24 DIAGNOSIS — O480 Post-term pregnancy: Secondary | ICD-10-CM

## 2016-04-24 DIAGNOSIS — Z3483 Encounter for supervision of other normal pregnancy, third trimester: Secondary | ICD-10-CM

## 2016-04-24 LAB — POCT URINALYSIS DIPSTICK
BILIRUBIN UA: NEGATIVE
GLUCOSE UA: NEGATIVE
NITRITE UA: NEGATIVE
PH UA: 6.5
PROTEIN UA: NEGATIVE
Spec Grav, UA: 1.01
Urobilinogen, UA: NEGATIVE

## 2016-04-24 NOTE — Patient Instructions (Signed)
Group B streptococcus (GBS) is a type of bacteria often found in healthy women. GBS is not the same as the bacteria that causes strep throat. You may have GBS in your vagina, rectum, or bladder. GBS does not spread through sexual contact, but it can be passed to a baby during childbirth. This can be dangerous for your baby. It is not dangerous to you and usually does not cause any symptoms. Your health care provider may test you for GBS when your pregnancy is between 35 and 37 weeks. GBS is dangerous only during birth, so there is no need to test for it earlier. It is possible to have GBS during pregnancy and never pass it to your baby. If your test results are positive for GBS, your health care provider may recommend giving you antibiotic medicine during delivery to make sure your baby stays healthy. RISK FACTORS You are more likely to pass GBS to your baby if:   Your water breaks (ruptured membrane) or you go into labor before 37 weeks.  Your water breaks 18 hours before you deliver.  You passed GBS during a previous pregnancy.  You have a urinary tract infection caused by GBS any time during pregnancy.  You have a fever during labor. SYMPTOMS Most women who have GBS do not have any symptoms. If you have a urinary tract infection caused by GBS, you might have frequent or painful urination and fever. Babies who get GBS usually show symptoms within 7 days of birth. Symptoms may include:   Breathing problems.  Heart and blood pressure problems.  Digestive and kidney problems. DIAGNOSIS Routine screening for GBS is recommended for all pregnant women. A health care provider takes a sample of the fluid in your vagina and rectum with a swab. It is then sent to a lab to be checked for GBS. A sample of your urine may also be checked for the bacteria.  TREATMENT If you test positive for GBS, you may need treatment with an antibiotic medicine during labor. As soon as you go into labor, or as soon as  your membranes rupture, you will get the antibiotic medicine through an IV access. You will continue to get the medicine until after you give birth. You do not need antibiotic medicine if you are having a cesarean delivery.If your baby shows signs or symptoms of GBS after birth, your baby can also be treated with an antibiotic medicine. HOME CARE INSTRUCTIONS   Take all antibiotic medicine as prescribed by your health care provider. Only take medicine as directed.   Continue with prenatal visits and care.   Keep all follow-up appointments.  SEEK MEDICAL CARE IF:   You have pain when you urinate.   You have to urinate frequently.   You have a fever.  SEEK IMMEDIATE MEDICAL CARE IF:   Your membranes rupture.  You go into labor.   This information is not intended to replace advice given to you by your health care provider. Make sure you discuss any questions you have with your health care provider.   Document Released: 10/29/2007 Document Revised: 07/27/2013 Document Reviewed: 05/14/2013 Elsevier Interactive Patient Education 2016 Elsevier Inc.  

## 2016-04-24 NOTE — Progress Notes (Signed)
ROB:   Notes occasional ctx and increased pressure. Labor precautions given. Attempted membrane sweeping. GBS+, will need abx in labor. RTC in 1 week if undelivered . For NST and AFI check at that time for post-dates pregnancy.

## 2016-04-25 ENCOUNTER — Inpatient Hospital Stay
Admission: EM | Admit: 2016-04-25 | Discharge: 2016-04-27 | DRG: 775 | Disposition: A | Discharge status: Home / Self Care | Payer: Medicaid Other | Attending: Obstetrics and Gynecology | Admitting: Obstetrics and Gynecology

## 2016-04-25 ENCOUNTER — Inpatient Hospital Stay: Payer: Medicaid Other | Admitting: Anesthesiology

## 2016-04-25 DIAGNOSIS — O99824 Streptococcus B carrier state complicating childbirth: Principal | ICD-10-CM | POA: Diagnosis present

## 2016-04-25 DIAGNOSIS — Z79899 Other long term (current) drug therapy: Secondary | ICD-10-CM | POA: Diagnosis not present

## 2016-04-25 DIAGNOSIS — Z8249 Family history of ischemic heart disease and other diseases of the circulatory system: Secondary | ICD-10-CM

## 2016-04-25 DIAGNOSIS — O9081 Anemia of the puerperium: Secondary | ICD-10-CM | POA: Diagnosis present

## 2016-04-25 DIAGNOSIS — Z23 Encounter for immunization: Secondary | ICD-10-CM | POA: Diagnosis not present

## 2016-04-25 DIAGNOSIS — Z3A39 39 weeks gestation of pregnancy: Secondary | ICD-10-CM

## 2016-04-25 DIAGNOSIS — Z3403 Encounter for supervision of normal first pregnancy, third trimester: Secondary | ICD-10-CM | POA: Diagnosis not present

## 2016-04-25 DIAGNOSIS — O99013 Anemia complicating pregnancy, third trimester: Secondary | ICD-10-CM | POA: Diagnosis present

## 2016-04-25 LAB — CBC
HCT: 31.9 % — ABNORMAL LOW (ref 35.0–47.0)
Hemoglobin: 10.3 g/dL — ABNORMAL LOW (ref 12.0–16.0)
MCH: 23.5 pg — AB (ref 26.0–34.0)
MCHC: 32.3 g/dL (ref 32.0–36.0)
MCV: 72.8 fL — AB (ref 80.0–100.0)
PLATELETS: 262 10*3/uL (ref 150–440)
RBC: 4.38 MIL/uL (ref 3.80–5.20)
RDW: 16.4 % — AB (ref 11.5–14.5)
WBC: 18.6 10*3/uL — AB (ref 3.6–11.0)

## 2016-04-25 LAB — TYPE AND SCREEN
ABO/RH(D): A POS
Antibody Screen: NEGATIVE

## 2016-04-25 MED ORDER — OXYTOCIN 40 UNITS IN LACTATED RINGERS INFUSION - SIMPLE MED
2.5000 [IU]/h | INTRAVENOUS | Status: DC
  Filled 2016-04-25: qty 1000

## 2016-04-25 MED ORDER — DIPHENHYDRAMINE HCL 25 MG PO CAPS: 25.0000 mg | ORAL_CAPSULE | Freq: Four times a day (QID) | ORAL | Status: DC | PRN

## 2016-04-25 MED ORDER — WITCH HAZEL-GLYCERIN EX PADS: 1.0000 "application " | MEDICATED_PAD | CUTANEOUS | Status: DC | PRN

## 2016-04-25 MED ORDER — BUTORPHANOL TARTRATE 1 MG/ML IJ SOLN
1.0000 mg | INTRAMUSCULAR | Status: DC | PRN
  Administered 2016-04-25: 1 mg via INTRAVENOUS
  Filled 2016-04-25: qty 1

## 2016-04-25 MED ORDER — ONDANSETRON HCL 4 MG/2ML IJ SOLN: 4.0000 mg | INTRAMUSCULAR | Status: DC | PRN

## 2016-04-25 MED ORDER — FENTANYL 2.5 MCG/ML W/ROPIVACAINE 0.2% IN NS 100 ML EPIDURAL INFUSION (ARMC-ANES)
10.0000 mL/h | EPIDURAL | Status: DC
  Filled 2016-04-25 (×3): qty 100

## 2016-04-25 MED ORDER — COCONUT OIL OIL: 1.0000 "application " | TOPICAL_OIL | Status: DC | PRN

## 2016-04-25 MED ORDER — LIDOCAINE HCL (PF) 1 % IJ SOLN
INTRAMUSCULAR | Status: AC
  Filled 2016-04-25: qty 30

## 2016-04-25 MED ORDER — NALOXONE HCL 2 MG/2ML IJ SOSY
1.0000 ug/kg/h | PREFILLED_SYRINGE | INTRAVENOUS | Status: DC | PRN
  Filled 2016-04-25: qty 2

## 2016-04-25 MED ORDER — NALBUPHINE HCL 10 MG/ML IJ SOLN: 5.0000 mg | INTRAMUSCULAR | Status: DC | PRN

## 2016-04-25 MED ORDER — LIDOCAINE-EPINEPHRINE (PF) 1.5 %-1:200000 IJ SOLN
INTRAMUSCULAR | Status: DC | PRN
  Administered 2016-04-25: 3 mL via PERINEURAL

## 2016-04-25 MED ORDER — SODIUM CHLORIDE FLUSH 0.9 % IV SOLN
INTRAVENOUS | Status: AC
  Filled 2016-04-25: qty 20

## 2016-04-25 MED ORDER — SODIUM CHLORIDE 0.9 % IV SOLN
2.0000 g | Freq: Once | INTRAVENOUS | Status: AC
  Administered 2016-04-25: 2 g via INTRAVENOUS
  Filled 2016-04-25: qty 2000

## 2016-04-25 MED ORDER — ACETAMINOPHEN 325 MG PO TABS: 650.0000 mg | ORAL_TABLET | ORAL | Status: DC | PRN

## 2016-04-25 MED ORDER — OXYTOCIN 40 UNITS IN LACTATED RINGERS INFUSION - SIMPLE MED
1.0000 m[IU]/min | INTRAVENOUS | Status: DC
  Administered 2016-04-25: 1 m[IU]/min via INTRAVENOUS

## 2016-04-25 MED ORDER — LIDOCAINE HCL (PF) 1 % IJ SOLN
INTRAMUSCULAR | Status: DC | PRN
  Administered 2016-04-25: 3 mL

## 2016-04-25 MED ORDER — ONDANSETRON HCL 4 MG/2ML IJ SOLN
4.0000 mg | Freq: Four times a day (QID) | INTRAMUSCULAR | Status: DC | PRN
  Administered 2016-04-25: 4 mg via INTRAVENOUS
  Filled 2016-04-25: qty 2

## 2016-04-25 MED ORDER — SODIUM CHLORIDE 0.9% FLUSH: 3.0000 mL | INTRAVENOUS | Status: DC | PRN

## 2016-04-25 MED ORDER — NALOXONE HCL 0.4 MG/ML IJ SOLN: 0.4000 mg | INTRAMUSCULAR | Status: DC | PRN

## 2016-04-25 MED ORDER — SIMETHICONE 80 MG PO CHEW: 80.0000 mg | CHEWABLE_TABLET | ORAL | Status: DC | PRN

## 2016-04-25 MED ORDER — PRENATAL MULTIVITAMIN CH
1.0000 | ORAL_TABLET | Freq: Every day | ORAL | Status: DC
  Administered 2016-04-26: 1 via ORAL
  Filled 2016-04-25: qty 1

## 2016-04-25 MED ORDER — OXYTOCIN 10 UNIT/ML IJ SOLN
INTRAMUSCULAR | Status: AC
  Filled 2016-04-25: qty 2

## 2016-04-25 MED ORDER — ONDANSETRON HCL 4 MG PO TABS: 4.0000 mg | ORAL_TABLET | ORAL | Status: DC | PRN

## 2016-04-25 MED ORDER — SOD CITRATE-CITRIC ACID 500-334 MG/5ML PO SOLN
30.0000 mL | ORAL | Status: DC | PRN
  Filled 2016-04-25: qty 30

## 2016-04-25 MED ORDER — BUPIVACAINE HCL (PF) 0.25 % IJ SOLN
INTRAMUSCULAR | Status: DC | PRN
  Administered 2016-04-25 (×2): 4 mL via EPIDURAL

## 2016-04-25 MED ORDER — DIPHENHYDRAMINE HCL 50 MG/ML IJ SOLN: 12.5000 mg | INTRAMUSCULAR | Status: DC | PRN

## 2016-04-25 MED ORDER — AMMONIA AROMATIC IN INHA
RESPIRATORY_TRACT | Status: AC
  Filled 2016-04-25: qty 10

## 2016-04-25 MED ORDER — SENNOSIDES-DOCUSATE SODIUM 8.6-50 MG PO TABS
2.0000 | ORAL_TABLET | ORAL | Status: DC
  Administered 2016-04-25 – 2016-04-27 (×2): 2 via ORAL
  Filled 2016-04-25 (×2): qty 2

## 2016-04-25 MED ORDER — FENTANYL 2.5 MCG/ML W/ROPIVACAINE 0.2% IN NS 100 ML EPIDURAL INFUSION (ARMC-ANES)
EPIDURAL | Status: DC | PRN
  Administered 2016-04-25: 9 mL/h via EPIDURAL

## 2016-04-25 MED ORDER — LIDOCAINE HCL (PF) 1 % IJ SOLN: 30.0000 mL | INTRAMUSCULAR | Status: DC | PRN

## 2016-04-25 MED ORDER — LACTATED RINGERS IV SOLN
INTRAVENOUS | Status: DC
  Administered 2016-04-25 (×2): via INTRAVENOUS

## 2016-04-25 MED ORDER — DIBUCAINE 1 % RE OINT: 1.0000 "application " | TOPICAL_OINTMENT | RECTAL | Status: DC | PRN

## 2016-04-25 MED ORDER — IBUPROFEN 600 MG PO TABS
600.0000 mg | ORAL_TABLET | Freq: Four times a day (QID) | ORAL | Status: DC
  Administered 2016-04-25 – 2016-04-27 (×5): 600 mg via ORAL
  Filled 2016-04-25 (×6): qty 1

## 2016-04-25 MED ORDER — ACETAMINOPHEN 325 MG PO TABS
650.0000 mg | ORAL_TABLET | ORAL | Status: DC | PRN
  Administered 2016-04-25: 650 mg via ORAL
  Filled 2016-04-25: qty 2

## 2016-04-25 MED ORDER — ZOLPIDEM TARTRATE 5 MG PO TABS
5.0000 mg | ORAL_TABLET | Freq: Every evening | ORAL | Status: DC | PRN
Start: 2016-04-25 — End: 2016-04-27

## 2016-04-25 MED ORDER — TERBUTALINE SULFATE 1 MG/ML IJ SOLN: 0.2500 mg | Freq: Once | INTRAMUSCULAR | Status: DC | PRN

## 2016-04-25 MED ORDER — SODIUM CHLORIDE 0.9 % IV SOLN
1.0000 g | INTRAVENOUS | Status: DC
  Administered 2016-04-25 (×2): 1 g via INTRAVENOUS
  Filled 2016-04-25 (×7): qty 1000

## 2016-04-25 MED ORDER — NALBUPHINE HCL 10 MG/ML IJ SOLN: 5.0000 mg | Freq: Once | INTRAMUSCULAR | Status: DC | PRN

## 2016-04-25 MED ORDER — LACTATED RINGERS IV SOLN: 500.0000 mL | INTRAVENOUS | Status: DC | PRN

## 2016-04-25 MED ORDER — MISOPROSTOL 200 MCG PO TABS
ORAL_TABLET | ORAL | Status: AC
  Filled 2016-04-25: qty 4

## 2016-04-25 MED ORDER — BENZOCAINE-MENTHOL 20-0.5 % EX AERO: 1.0000 "application " | INHALATION_SPRAY | CUTANEOUS | Status: DC | PRN

## 2016-04-25 MED ORDER — DIPHENHYDRAMINE HCL 25 MG PO CAPS: 25.0000 mg | ORAL_CAPSULE | ORAL | Status: DC | PRN

## 2016-04-25 MED ORDER — OXYTOCIN BOLUS FROM INFUSION: 500.0000 mL | Freq: Once | INTRAVENOUS | Status: DC

## 2016-04-25 NOTE — Anesthesia Preprocedure Evaluation (Signed)
Anesthesia Evaluation  Patient identified by MRN, date of birth, ID band Patient awake    Reviewed: Allergy & Precautions, NPO status , Patient's Chart, lab work & pertinent test results, reviewed documented beta blocker date and time   Airway Mallampati: II  TM Distance: >3 FB Neck ROM: Full    Dental  (+) Dental Advisory Given   Pulmonary           Cardiovascular      Neuro/Psych  Headaches,    GI/Hepatic GERD  ,  Endo/Other    Renal/GU      Musculoskeletal   Abdominal   Peds  Hematology   Anesthesia Other Findings   Reproductive/Obstetrics (+) Pregnancy                             Anesthesia Physical Anesthesia Plan  ASA: II  Anesthesia Plan: Epidural   Post-op Pain Management:    Induction:   Airway Management Planned:   Additional Equipment:   Intra-op Plan:   Post-operative Plan:   Informed Consent:   Dental advisory given  Plan Discussed with: CRNA and Anesthesiologist  Anesthesia Plan Comments:         Anesthesia Quick Evaluation

## 2016-04-25 NOTE — Plan of Care (Signed)
Dr Valentino Saxoncherry here for delivery

## 2016-04-25 NOTE — Anesthesia Procedure Notes (Signed)
Epidural Patient location during procedure: OB Start time: 04/25/2016 7:43 AM End time: 04/25/2016 8:28 AM  Staffing Anesthesiologist: Rosaria FerriesPISCITELLO, JOSEPH K Resident/CRNA: Malva CoganBEANE, Courtny Bennison Performed: resident/CRNA   Preanesthetic Checklist Completed: patient identified, site marked, surgical consent, pre-op evaluation, timeout performed, IV checked, risks and benefits discussed and monitors and equipment checked  Epidural Patient position: sitting Prep: Betadine Patient monitoring: heart rate, continuous pulse ox and blood pressure Approach: midline Location: L4-L5 Injection technique: LOR saline  Needle:  Needle type: Tuohy  Needle gauge: 18 G Needle length: 9 cm and 9 Needle insertion depth: 4 cm Catheter type: closed end flexible Catheter size: 20 Guage Catheter at skin depth: 9 cm Test dose: negative and 1.5% lidocaine with Epi 1:200 K  Assessment Sensory level: T10 Events: blood not aspirated, injection not painful, no injection resistance, negative IV test and no paresthesia  Additional Notes   Patient tolerated the insertion well without complications.Reason for block:procedure for pain

## 2016-04-25 NOTE — Plan of Care (Signed)
Pt noted to have variable decels after starting pitocin. Pitocin turned off. Dr Valentino Saxoncherry notified. Pt repositioned. She is on her way and will be here momentarily

## 2016-04-25 NOTE — H&P (Signed)
Obstetric History and Physical  [facility] Amick is a 20 y.o. G1P0000 with IUP at [redacted]w[redacted]d presenting for complaints of contractions since yesterday, worsening since 11:00 p.m. Patient states she has been having  regular, every 2-3 minutes contractions, none and minimal vaginal bleeding (had membrane sweeping in office yesterday), intact membranes, with active fetal movement.    Prenatal Course Source of Care: [facility] with onset of care at 6 weeks Pregnancy complications or risks: Patient Active Problem List   Diagnosis Date Noted  . Labor and delivery, indication for care 04/25/2016  . Irregular uterine contractions 04/15/2016  . Excessive weight gain during pregnancy in second trimester 01/12/2016  . Supervision of normal pregnancy in third trimester 01/12/2016  . Underweight 12/13/2015  . Migraine without aura and without status migrainosus, not intractable 10/17/2015   She plans to breastfeed She is unsure of postpartum contraceptive desires currently..   Prenatal labs and studies: ABO, Rh: --/Positive/-- 09-08-2022 1123) Antibody: Negative 09/08/22 1104) Rubella: <20.0 2022/09/08 1104) RPR: Non Reactive 09/08/2022 1104)  HBsAg: Negative 2022/09/08 1104)  HIV: Non Reactive 09-08-2022 1104)  GBS: Positive (09/06 1156) 1 hr Glucola  Normal Genetic screening normal Anatomy US normal   Past Medical History:  Diagnosis Date  . 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 Headache    migraine- w/o aura    Past Surgical History:  Procedure Laterality Date  . wisdom teeth pulled      OB History  Gravida Para Term Preterm AB Living  1 0 0 0 0 0  SAB TAB Ectopic Multiple Live Births  0 0 0 0      # Outcome Date GA Lbr Len/2nd Weight Sex Delivery Anes PTL Lv  1 Current               Social History   Social History  . Marital status: Single    Spouse name: N/A  . Number of children: N/A  . Years of education: N/A    Social History Main Topics  . Smoking status: Never Smoker  . Smokeless tobacco: Never Used  . Alcohol use No  . Drug use: No  . Sexual activity: Yes    Partners: Male    Birth control/ protection: Pill     Comment: Pregnant    Other Topics Concern  . None   Social History Narrative  . None    Family History  Problem Relation Age of Onset  . Hypertension Father   . Diabetes Neg Hx   . Cancer Neg Hx   . Hearing loss Neg Hx     Prescriptions Prior to Admission  Medication Sig Dispense Refill Last Dose  . Prenatal Vit-Fe Fumarate-FA (MULTIVITAMIN-PRENATAL) 27-0.8 MG TABS tablet Take 1 tablet by mouth daily at 12 noon.   04/24/2016 at Unknown time  . Butalbital-APAP-Caffeine 50-325-40 MG capsule Take 1-2 capsules by mouth every 6 (six) hours as needed for headache. (Patient not taking: Reported on 04/25/2016) 30 capsule 3 Not Taking at Unknown time  . loratadine (CLARITIN) 10 MG tablet Take 1 tablet (10 mg total) by mouth daily. (Patient not taking: Reported on 04/25/2016) 30 tablet 0 Not Taking at Unknown time    No Known Allergies  Review of Systems: Negative except for what is mentioned in HPI.  Physical Exam: BP 129/86 (BP Location: Left Arm)   Pulse 98   Temp 98.3 F (36.8 C) (Oral)   Resp 18  Ht 5\' 2"  (1.575 m)   Wt 136 lb (61.7 kg)   LMP 07/23/2015   BMI 24.87 kg/m  CONSTITUTIONAL: Well-developed, well-nourished female in no acute distress.  HENT:  Normocephalic, atraumatic, External right and left ear normal. Oropharynx is clear and moist EYES: Conjunctivae and EOM are normal. Pupils are equal, round, and reactive to light. No scleral icterus.  NECK: Normal range of motion, supple, no masses SKIN: Skin is warm and dry. No rash noted. Not diaphoretic. No erythema. No pallor. NEUROLOGIC: Alert and oriented to person, place, and time. Normal reflexes, muscle tone coordination. No cranial nerve deficit noted. PSYCHIATRIC: Normal mood and affect. Normal behavior.  Normal judgment and thought content. CARDIOVASCULAR: Normal heart rate noted, regular rhythm RESPIRATORY: Effort and breath sounds normal, no problems with respiration noted ABDOMEN: Soft, nontender, nondistended, gravid. MUSCULOSKELETAL: Normal range of motion. No edema and no tenderness. 2+ distal pulses.  Cervical Exam: Dilatation 3.5-4cm   Effacement 60%   Station -2   Presentation: cephalic FHT:  Baseline rate 130 bpm   Variability moderate  Accelerations present   Decelerations none Contractions: Every 3-4 mins   Pertinent Labs/Studies:   Results for orders placed or performed during the hospital encounter of 04/25/16 (from the past 24 hour(s))  CBC     Status: Abnormal   Collection Time: 04/25/16  5:18 AM  Result Value Ref Range   WBC 18.6 (H) 3.6 - 11.0 K/uL   RBC 4.38 3.80 - 5.20 MIL/uL   Hemoglobin 10.3 (L) 12.0 - 16.0 g/dL   HCT 16.131.9 (L) 09.635.0 - 04.547.0 %   MCV 72.8 (L) 80.0 - 100.0 fL   MCH 23.5 (L) 26.0 - 34.0 pg   MCHC 32.3 32.0 - 36.0 g/dL   RDW 40.916.4 (H) 81.111.5 - 91.414.5 %   Platelets 262 150 - 440 K/uL    [facility] : [facility] SilviusBateman is a 20 y.o. G1P0000 at 3158w4d being admitted for labor.  Plan: Labor: Expectant management.  Augmentation as needed, per protocol FWB: Reassuring fetal heart tracing.  GBS positive, will treat with ampicillin.  Delivery plan: Hopeful for vaginal delivery   Hildred LaserAnika Derrick Tiegs, MD [facility]

## 2016-04-25 NOTE — Progress Notes (Signed)
fhr 70's . o2 applied via mask at 10 l

## 2016-04-25 NOTE — Progress Notes (Signed)
Intrapartum Progress Note  S: Patient denies complaints. Epidural in place.   O: Blood pressure 121/74, pulse 84, temperature 99.2 F (37.3 C), temperature source Oral, resp. rate 16, height 5\' 2"  (1.575 m), weight 136 lb (61.7 kg), last menstrual period 07/23/2015, SpO2 100 %. Gen App: NAD, comfortable Abdomen: soft, gravid FHT: baseline 135 bpm.  Accels present.  Decels absent. moderate in degree variability.   Tocometer: contractions q 2-5 minutes Cervix:  Extremities: Nontender, no edema.  Pitocin: None  Labs: No new labs   Assessment:  1: SIUP at 6236w4d 2. Active labor 3. GBS +  Plan:  1. Continue expectant management, will augment if needed.  2. Continue ampicillin for GBS prophylaxis.  3. Anticipate vaginal delivery   Hildred LaserAnika Bauer Ausborn, MD 04/25/2016 12:21 PM

## 2016-04-25 NOTE — Progress Notes (Signed)
svd of viable female. apgars 8/9. 30 secs PPV.

## 2016-04-25 NOTE — OB Triage Note (Signed)
G1P0 patient arrived complaining of contractions starting around noon on 9/20 and have continued to worsen.  Reporting 2-3 minutes apart, consistent for the past 3 hours.  Last check at office was 9/20 and was 2 cm.

## 2016-04-26 LAB — CBC
HEMATOCRIT: 27.8 % — AB (ref 35.0–47.0)
HEMOGLOBIN: 9.1 g/dL — AB (ref 12.0–16.0)
MCH: 23.9 pg — AB (ref 26.0–34.0)
MCHC: 32.7 g/dL (ref 32.0–36.0)
MCV: 73.3 fL — AB (ref 80.0–100.0)
Platelets: 227 10*3/uL (ref 150–440)
RBC: 3.79 MIL/uL — ABNORMAL LOW (ref 3.80–5.20)
RDW: 16.2 % — AB (ref 11.5–14.5)
WBC: 19.4 10*3/uL — ABNORMAL HIGH (ref 3.6–11.0)

## 2016-04-26 LAB — RPR: RPR Ser Ql: NONREACTIVE

## 2016-04-26 MED ORDER — HYDROCODONE-ACETAMINOPHEN 5-325 MG PO TABS: 1.0000 | ORAL_TABLET | ORAL | Status: DC | PRN

## 2016-04-26 NOTE — Progress Notes (Signed)
Post Partum Day # 1, s/p SVD  Subjective: no complaints, up ad lib, voiding and tolerating PO  Objective: Temp:  [98.7 F (37.1 C)-100.1 F (37.8 C)] 98.9 F (37.2 C) (09/22 0341) Pulse Rate:  [72-93] 82 (09/22 0341) Resp:  [16-20] 18 (09/22 0341) BP: (109-131)/(63-78) 109/63 (09/22 0341) SpO2:  [98 %-100 %] 100 % (09/21 1150)  Physical Exam:  General: alert and no distress  Lungs: clear to auscultation bilaterally Breasts: normal appearance, no masses or tenderness Heart: regular rate and rhythm, S1, S2 normal, no murmur, click, rub or gallop Pelvis: Lochia: appropriate, Uterine Fundus: firm Extremities: DVT Evaluation: No evidence of DVT seen on physical exam. Negative Homan's sign. No cords or calf tenderness. No significant calf/ankle edema.   Recent Labs  04/25/16 0518  HGB 10.3*  HCT 31.9*    Assessment/Plan: S/p SVD, doing well.  Currently bottle feeding but would like to try breastfeeding. For lactation consult Desires OCPs for contraception Desires circumcision for female infant.  To be performed outpatient.  Pending a.m. CBC Plan for discharge tomorrow    LOS: 1 day   Hildred LaserAnika Taraneh Metheney [facility]

## 2016-04-26 NOTE — Anesthesia Postprocedure Evaluation (Signed)
Anesthesia Post Note  Patient: Marcia Hodges  Procedure(s) Performed: * No procedures listed *  Patient location during evaluation: Mother Baby Anesthesia Type: Epidural Level of consciousness: patient cooperative, oriented and awake and alert Pain management: satisfactory to patient Vital Signs Assessment: post-procedure vital signs reviewed and stable Respiratory status: respiratory function stable, nonlabored ventilation and spontaneous breathing Cardiovascular status: stable Postop Assessment: no headache, no backache, no signs of nausea or vomiting and adequate PO intake Anesthetic complications: no    Last Vitals:  Vitals:   04/25/16 2322 04/26/16 0341  BP: 120/68 109/63  Pulse: 91 82  Resp: 20 18  Temp: 37.8 C 37.2 C    Last Pain:  Vitals:   04/26/16 0341  TempSrc: Oral  PainSc:                  Lenward ChancellorSavage,  Kareema Keitt A

## 2016-04-27 DIAGNOSIS — O99013 Anemia complicating pregnancy, third trimester: Secondary | ICD-10-CM | POA: Diagnosis present

## 2016-04-27 LAB — VARICELLA ZOSTER ANTIBODY, IGG: VARICELLA IGG: 175 {index} (ref >165)

## 2016-04-27 LAB — RUBELLA SCREEN: Rubella: 1.57 index (ref >0.99)

## 2016-04-27 MED ORDER — DOCUSATE SODIUM 100 MG PO CAPS: 100.0000 mg | ORAL_CAPSULE | Freq: Two times a day (BID) | ORAL | 2 refills | Status: DC | PRN

## 2016-04-27 MED ORDER — INFLUENZA VAC SPLIT QUAD 0.5 ML IM SUSY: 0.5000 mL | PREFILLED_SYRINGE | INTRAMUSCULAR | Status: DC

## 2016-04-27 MED ORDER — FERROUS SULFATE 325 (65 FE) MG PO TABS: 325.0000 mg | ORAL_TABLET | Freq: Two times a day (BID) | ORAL | 1 refills | Status: DC

## 2016-04-27 MED ORDER — MEASLES, MUMPS & RUBELLA VAC ~~LOC~~ INJ
0.5000 mL | INJECTION | Freq: Once | SUBCUTANEOUS | Status: AC
Start: 2016-04-27 — End: 2016-04-27
  Administered 2016-04-27: 0.5 mL via SUBCUTANEOUS
  Filled 2016-04-27 (×2): qty 0.5

## 2016-04-27 MED ORDER — IBUPROFEN 600 MG PO TABS: 600.0000 mg | ORAL_TABLET | Freq: Three times a day (TID) | ORAL | 1 refills | Status: DC | PRN

## 2016-04-27 MED ORDER — INFLUENZA VAC SPLIT QUAD 0.5 ML IM SUSY
0.5000 mL | PREFILLED_SYRINGE | INTRAMUSCULAR | Status: AC
  Administered 2016-04-27: 0.5 mL via INTRAMUSCULAR
  Filled 2016-04-27: qty 0.5

## 2016-04-27 NOTE — Progress Notes (Signed)
Dc instructions given. Went over meds. Verbalizes understanding.

## 2016-04-27 NOTE — Discharge Summary (Signed)
Obstetric Discharge Summary Reason for Admission: onset of labor Prenatal Procedures: ultrasound Intrapartum Procedures: spontaneous vaginal delivery Postpartum Procedures: Rubella Ig and Influenza vaccine Complications-Operative and Postpartum: none Hemoglobin  Date Value Ref Range Status  04/26/2016 9.1 (L) 12.0 - 16.0 g/dL Final   HGB  Date Value Ref Range Status  05/10/2013 14.4 12.0 - 16.0 g/dL Final   HCT  Date Value Ref Range Status  04/26/2016 27.8 (L) 35.0 - 47.0 % Final   Hematocrit  Date Value Ref Range Status  02/07/2016 31.1 (L) 34.0 - 46.6 % Final    Physical Exam:  Blood pressure 135/74, pulse 77, temperature 98.2 F (36.8 C), temperature source Oral, resp. rate 14, height 5\' 2"  (1.575 m), weight 136 lb (61.7 kg), last menstrual period 07/23/2015, SpO2 99 %, unknown if currently breastfeeding.  General: alert and no distress Lochia: appropriate Uterine Fundus: firm Incision: None DVT Evaluation: No evidence of DVT seen on physical exam. Negative Homan's sign. No cords or calf tenderness. No significant calf/ankle edema.  Discharge Diagnoses: Term Pregnancy-delivered  Discharge Information: Date: 04/27/2016 Activity: pelvic rest Diet: routine Medications: PNV, Ibuprofen, Colace and Iron Condition: stable Instructions: refer to practice specific booklet Discharge to: home Follow-up Information    Marcia LaserAnika Lillan Mccreadie, Marcia Hodges Follow up in 6 week(s).   Specialties:  Obstetrics and Gynecology, Radiology Why:  Postpartum visit Contact information: [address] [place] [address] [phone]           Newborn Data: Live born female  Birth Weight: 7 lb 1.2 oz (3210 g) APGAR: 7, 9  Home with mother.  Marcia Hodges 04/27/2016, 1:26 PM

## 2016-04-27 NOTE — Progress Notes (Addendum)
Post Partum Day # 2, s/p SVD  Subjective: no complaints, up ad lib, voiding and tolerating PO  Objective: Blood pressure 135/74, pulse 77, temperature 98.2 F (36.8 C), temperature source Oral, resp. rate 14, height 5\' 2"  (1.575 m), weight 136 lb (61.7 kg), last menstrual period 07/23/2015, SpO2 99 %, unknown if currently breastfeeding.  Physical Exam:  General: alert and no distress  Lungs: clear to auscultation bilaterally Breasts: normal appearance, no masses or tenderness Heart: regular rate and rhythm, S1, S2 normal, no murmur, click, rub or gallop Pelvis: Lochia: appropriate, Uterine Fundus: firm Extremities: DVT Evaluation: No evidence of DVT seen on physical exam. Negative Homan's sign. No cords or calf tenderness. No significant calf/ankle edema.   Recent Labs  04/25/16 0518 04/26/16 1538  HGB 10.3* 9.1*  HCT 31.9* 27.8*    Assessment/Plan: S/p SVD, doing well.  Currently bottle feeding and breastfeeding.  Desires OCPs for contraception Desires circumcision for female infant.  To be performed outpatient.  Mild anemia postpartum, asymptomatic, will treat with PO iron.  Plan for discharge today   LOS: 2 days   Hildred LaserAnika Kawon Willcutt [facility]

## 2016-04-27 NOTE — Discharge Instructions (Signed)

## 2016-04-27 NOTE — Progress Notes (Signed)
Dc home via w/c 

## 2016-05-01 ENCOUNTER — Other Ambulatory Visit: Payer: Medicaid Other

## 2016-05-01 ENCOUNTER — Encounter: Payer: Medicaid Other | Admitting: Obstetrics and Gynecology

## 2016-05-08 ENCOUNTER — Encounter: Payer: Medicaid Other | Admitting: Obstetrics and Gynecology

## 2016-06-13 ENCOUNTER — Encounter: Payer: Medicaid Other | Admitting: Obstetrics and Gynecology

## 2016-06-13 ENCOUNTER — Telehealth: Payer: Self-pay | Admitting: Obstetrics and Gynecology

## 2016-06-13 NOTE — Telephone Encounter (Signed)
PT COULD NOT MAKE HER APPT TODAY DUE TO WRECK AND SHE WANTED TO KNOW WHEN SHE CAN COME BACK IN. IT WAS FOR HER 6 WEEK POST PARTUM, SHE IS MEDICAID IT WILL NEED TO BE BY THE END OF THE MONTH PER THE MEDICAID POLICY. LET ME KNOW AND I CAN SCHEDULE FOR HER, DR CHERRY HAS NO OPENINGS AS OF RIGHT NOW, NEED OK PER YOU TO PUT ON SCHEDULE.

## 2016-06-13 NOTE — Telephone Encounter (Signed)
Please have pt added to the schedule for 07/02/16 at 10am. Thanks

## 2016-06-13 NOTE — Telephone Encounter (Signed)
Done pt aware °

## 2016-07-02 ENCOUNTER — Ambulatory Visit (INDEPENDENT_AMBULATORY_CARE_PROVIDER_SITE_OTHER): Payer: Medicaid Other | Admitting: Obstetrics and Gynecology

## 2016-07-02 ENCOUNTER — Encounter: Payer: Self-pay | Admitting: Obstetrics and Gynecology

## 2016-07-02 VITALS — BP 108/67 | HR 77 | Ht 62.0 in | Wt 115.4 lb

## 2016-07-02 DIAGNOSIS — O9081 Anemia of the puerperium: Secondary | ICD-10-CM

## 2016-07-02 DIAGNOSIS — Z01419 Encounter for gynecological examination (general) (routine) without abnormal findings: Secondary | ICD-10-CM | POA: Diagnosis not present

## 2016-07-02 DIAGNOSIS — Z308 Encounter for other contraceptive management: Secondary | ICD-10-CM | POA: Diagnosis not present

## 2016-07-02 NOTE — Progress Notes (Signed)
GYNECOLOGY ROUTINE PHYSICAL EXAM CLINIC NOTE  Subjective:     Marcia Hodges is a 20 y.o. female here for a routine exam. Patient also notes that she is following from her vaginal delivery ~ 2-3 months ago as she missed her postpartum appointment. Current complaints: none.  The patient does wear seatbelts. Denies history of domestic violence.  Is currently bottle feeding.  Has resumed menses.  Postpartum depression screen is negative.   Desires to discuss contraception.    Gynecologic History Patient's last menstrual period was 06/19/2016. Contraception: abstinence Last Pap: 04/2016. Results were: normal.  Denies h/o abnormal pap smears.  Denies h/o STIs.   Obstetric History OB History  Gravida Para Term Preterm AB Living  1 1 1  0 0 0  SAB TAB Ectopic Multiple Live Births  0 0 0 0      # Outcome Date GA Lbr Len/2nd Weight Sex Delivery Anes PTL Lv  1 Term 04/25/16 [id number]  7 lb 1.2 oz (3.21 kg) M Vag-Spont EPI        Past Medical History:  Diagnosis Date  . 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 Headache    migraine- w/o aura    Family History  Problem Relation Age of Onset  . Hypertension Father   . Diabetes Neg Hx   . Cancer Neg Hx   . Hearing loss Neg Hx     Past Surgical History:  Procedure Laterality Date  . wisdom teeth pulled      Social History   Social History  . Marital status: Single    Spouse name: N/A  . Number of children: N/A  . Years of education: N/A   Occupational History  . Not on file.   Social History Main Topics  . Smoking status: Never Smoker  . Smokeless tobacco: Never Used  . Alcohol use No  . Drug use: No  . Sexual activity: Yes    Partners: Male    Birth control/ protection: None   Other Topics Concern  . Not on file   Social History Narrative  . No narrative on file    Current Outpatient Prescriptions on File Prior to Visit  Medication Sig Dispense  Refill  . ferrous sulfate (FERROUSUL) 325 (65 FE) MG tablet Take 1 tablet (325 mg total) by mouth 2 (two) times daily. 60 tablet 1   No current facility-administered medications on file prior to visit.     No Known Allergies   Review of Systems Constitutional: negative for chills, fatigue, fevers and sweats Eyes: negative for irritation, redness and visual disturbance Ears, nose, mouth, throat, and face: negative for hearing loss, nasal congestion, snoring and tinnitus Respiratory: negative for asthma, cough, sputum Cardiovascular: negative for chest pain, dyspnea, exertional chest pressure/discomfort, irregular heart beat, palpitations and syncope Gastrointestinal: negative for abdominal pain, change in bowel habits, nausea and vomiting Genitourinary: negative for abnormal menstrual periods, genital lesions, sexual problems and vaginal discharge, dysuria and urinary incontinence Integument/breast: negative for breast lump, breast tenderness and nipple discharge Hematologic/lymphatic: negative for bleeding and easy bruising and muscle weakness Neurological: negative for dizziness, headaches, vertigo and weakness Endocrine: negative for diabetic symptoms including polydipsia, polyuria and skin dryness Allergic/Immunologic: negative for hay fever and urticaria       Objective:    BP 108/67 (BP Location: Left Arm, Patient Position: Sitting, Cuff Size: Normal)   Pulse 77   Ht 5\' 2"  (1.575 m)  Wt 115 lb 6.4 oz (52.3 kg)   LMP 06/19/2016   Breastfeeding? No   BMI 21.11 kg/m    General Appearance:    Alert, cooperative, no distress, appears stated age  Head:    Normocephalic, without obvious abnormality, atraumatic  Eyes:    PERRL, conjunctiva/corneas clear, EOM's intact, both eyes  Ears:    Normal external ear canals, both ears  Nose:   Nares normal, septum midline, mucosa normal, no drainage or sinus tenderness  Throat:   Lips, mucosa, and tongue normal; teeth and gums normal    Neck:   Supple, symmetrical, trachea midline, no adenopathy; thyroid:  no enlargement/tenderness/nodules; no carotid bruit or JVD  Back:     Symmetric, no curvature, ROM normal, no CVA tenderness  Lungs:     Clear to auscultation bilaterally, respirations unlabored  Chest Wall:    No tenderness or deformity   Heart:    Regular rate and rhythm, S1 and S2 normal, no murmur, rub or gallop  Breast Exam:    No tenderness, masses, or nipple abnormality  Abdomen:     Soft, non-tender, bowel sounds active all four quadrants, no masses, no organomegaly  Genitalia:    Normal female without lesion, discharge or tenderness  Rectal:    Normal tone, no masses or tenderness  Extremities:   Extremities normal, atraumatic, no cyanosis or edema  Pulses:   2+ and symmetric all extremities  Skin:   Skin color, texture, turgor normal, no rashes or lesions  Lymph nodes:   Cervical, supraclavicular, and axillary nodes normal  Neurologic:   CNII-XII intact, normal strength, sensation and reflexes throughout    Labs:  Results for Marcia Hodges, Marcia Hodges (MRN [id number]) as of 07/04/2016 21:00  Ref. Range 04/26/2016 15:38  WBC Latest Ref Range: 3.6 - 11.0 K/uL 19.4 (H)  RBC Latest Ref Range: 3.80 - 5.20 MIL/uL 3.79 (L)  Hemoglobin Latest Ref Range: 12.0 - 16.0 g/dL 9.1 (L)  HCT Latest Ref Range: 35.0 - 47.0 % 27.8 (L)  MCV Latest Ref Range: 80.0 - 100.0 fL 73.3 (L)  MCH Latest Ref Range: 26.0 - 34.0 pg 23.9 (L)  MCHC Latest Ref Range: 32.0 - 36.0 g/dL 47.832.7  RDW Latest Ref Range: 11.5 - 14.5 % 16.2 (H)  Platelets Latest Ref Range: 150 - 440 K/uL 227    Assessment:    Healthy female exam.   Contraception management Anemia (postpartum) Plan:    Education reviewed: safe sex/STD prevention, self breast exams, skin cancer screening and weight bearing exercise. Labs ordered: CBC, CMP. Pap smear up to date.  Next due in 3-5 years.  Contraception: Nexplanon desired.  Currently maintaining abstinence. RTC near time  of next menses, in 3 weeks.  Declines flu vaccine.  Discussed HPV vaccine. Patient desires to think about it.  Follow up in: 1 year for annual exam.      Hildred LaserAnika Chardae Mulkern, MD [facility]

## 2016-07-03 LAB — CBC
HEMATOCRIT: 38.8 % (ref 34.0–46.6)
Hemoglobin: 12.6 g/dL (ref 11.1–15.9)
MCH: 24.8 pg — ABNORMAL LOW (ref 26.6–33.0)
MCHC: 32.5 g/dL (ref 31.5–35.7)
MCV: 76 fL — AB (ref 79–97)
Platelets: 387 10*3/uL — ABNORMAL HIGH (ref 150–379)
RBC: 5.09 x10E6/uL (ref 3.77–5.28)
RDW: 20.6 % — ABNORMAL HIGH (ref 12.3–15.4)
WBC: 7.7 10*3/uL (ref 3.4–10.8)

## 2016-07-03 LAB — COMPREHENSIVE METABOLIC PANEL
A/G RATIO: 1.4 (ref 1.2–2.2)
ALBUMIN: 4.6 g/dL (ref 3.5–5.5)
ALT: 65 IU/L — ABNORMAL HIGH (ref 0–32)
AST: 34 IU/L (ref 0–40)
Alkaline Phosphatase: 114 IU/L (ref 39–117)
BUN/Creatinine Ratio: 12 (ref 9–23)
BUN: 8 mg/dL (ref 6–20)
Bilirubin Total: 0.3 mg/dL (ref 0.0–1.2)
CALCIUM: 10 mg/dL (ref 8.7–10.2)
CO2: 26 mmol/L (ref 18–29)
Chloride: 98 mmol/L (ref 96–106)
Creatinine, Ser: 0.68 mg/dL (ref 0.57–1.00)
GFR, EST AFRICAN AMERICAN: 145 mL/min/{1.73_m2} (ref >59)
GFR, EST NON AFRICAN AMERICAN: 125 mL/min/{1.73_m2} (ref >59)
GLOBULIN, TOTAL: 3.2 g/dL (ref 1.5–4.5)
Glucose: 91 mg/dL (ref 65–99)
POTASSIUM: 4 mmol/L (ref 3.5–5.2)
SODIUM: 142 mmol/L (ref 134–144)
TOTAL PROTEIN: 7.8 g/dL (ref 6.0–8.5)

## 2016-07-25 ENCOUNTER — Ambulatory Visit (INDEPENDENT_AMBULATORY_CARE_PROVIDER_SITE_OTHER): Payer: Medicaid Other | Admitting: Obstetrics and Gynecology

## 2016-07-25 ENCOUNTER — Encounter: Payer: Self-pay | Admitting: Obstetrics and Gynecology

## 2016-07-25 VITALS — BP 104/68 | HR 71 | Ht 62.0 in | Wt 115.2 lb

## 2016-07-25 DIAGNOSIS — Z30017 Encounter for initial prescription of implantable subdermal contraceptive: Secondary | ICD-10-CM

## 2016-07-25 LAB — POCT URINE PREGNANCY: Preg Test, Ur: NEGATIVE

## 2016-07-30 NOTE — Progress Notes (Signed)
     GYNECOLOGY OFFICE PROCEDURE NOTE  Gardiner Coinsaylor Hope Lajean SilviusBateman is a 20 y.o. G1P1000 here for Nexplanon insertion.  Last pap smear was on 04/10/2016 and was normal.  No other gynecologic concerns.  Nexplanon Insertion Procedure Patient identified, informed consent performed, consent signed.   Patient does understand that irregular bleeding is a very common side effect of this medication. She was advised to have backup contraception for one week after placement. Pregnancy test in clinic today was negative.  Appropriate time out taken.  Patient's left arm was prepped and draped in the usual sterile fashion. The ruler used to measure and mark insertion area.  Patient was prepped with alcohol swab and then injected with 3 ml of 1% lidocaine.  She was prepped with betadine, Nexplanon removed from packaging,  Device confirmed in needle, then inserted full length of needle and withdrawn per handbook instructions. Nexplanon was able to palpated in the patient's arm; patient palpated the insert herself. There was minimal blood loss.  Patient insertion site covered with guaze and a pressure bandage to reduce any bruising.  The patient tolerated the procedure well and was given post procedure instructions.    Hildred LaserAnika Makylah Bossard, MD [facility]

## 2016-10-23 ENCOUNTER — Emergency Department: Payer: Self-pay

## 2016-10-23 ENCOUNTER — Emergency Department
Admission: EM | Admit: 2016-10-23 | Discharge: 2016-10-23 | Disposition: A | Discharge status: Home / Self Care | Payer: Self-pay | Attending: Emergency Medicine | Admitting: Emergency Medicine

## 2016-10-23 ENCOUNTER — Encounter: Payer: Self-pay | Admitting: Medical Oncology

## 2016-10-23 DIAGNOSIS — N200 Calculus of kidney: Secondary | ICD-10-CM | POA: Insufficient documentation

## 2016-10-23 LAB — COMPREHENSIVE METABOLIC PANEL
ALBUMIN: 4.5 g/dL (ref 3.5–5.0)
ALT: 29 U/L (ref 14–54)
ANION GAP: 8 (ref 5–15)
AST: 23 U/L (ref 15–41)
Alkaline Phosphatase: 77 U/L (ref 38–126)
BUN: 12 mg/dL (ref 6–20)
CHLORIDE: 104 mmol/L (ref 101–111)
CO2: 25 mmol/L (ref 22–32)
Calcium: 9.3 mg/dL (ref 8.9–10.3)
Creatinine, Ser: 0.64 mg/dL (ref 0.44–1.00)
GFR calc non Af Amer: >60 mL/min (ref >60)
Glucose, Bld: 97 mg/dL (ref 65–99)
Potassium: 3.7 mmol/L (ref 3.5–5.1)
SODIUM: 137 mmol/L (ref 135–145)
Total Bilirubin: 0.7 mg/dL (ref 0.3–1.2)
Total Protein: 8.6 g/dL — ABNORMAL HIGH (ref 6.5–8.1)

## 2016-10-23 LAB — URINALYSIS, COMPLETE (UACMP) WITH MICROSCOPIC
BILIRUBIN URINE: NEGATIVE
Bacteria, UA: NONE SEEN
GLUCOSE, UA: NEGATIVE mg/dL
KETONES UR: 20 mg/dL — AB
LEUKOCYTES UA: NEGATIVE
Nitrite: NEGATIVE
PROTEIN: NEGATIVE mg/dL
Specific Gravity, Urine: 1.025 (ref 1.005–1.030)
pH: 5 (ref 5.0–8.0)

## 2016-10-23 LAB — CBC
HCT: 41.5 % (ref 35.0–47.0)
HEMOGLOBIN: 13.8 g/dL (ref 12.0–16.0)
MCH: 26.9 pg (ref 26.0–34.0)
MCHC: 33.2 g/dL (ref 32.0–36.0)
MCV: 80.8 fL (ref 80.0–100.0)
Platelets: 288 10*3/uL (ref 150–440)
RBC: 5.14 MIL/uL (ref 3.80–5.20)
RDW: 14.4 % (ref 11.5–14.5)
WBC: 11.7 10*3/uL — ABNORMAL HIGH (ref 3.6–11.0)

## 2016-10-23 LAB — LIPASE, BLOOD: LIPASE: 15 U/L (ref 11–51)

## 2016-10-23 LAB — POCT PREGNANCY, URINE: Preg Test, Ur: NEGATIVE

## 2016-10-23 MED ORDER — OXYCODONE-ACETAMINOPHEN 5-325 MG PO TABS: 1.0000 | ORAL_TABLET | Freq: Four times a day (QID) | ORAL | 0 refills | Status: AC | PRN

## 2016-10-23 MED ORDER — ONDANSETRON HCL 4 MG/2ML IJ SOLN
INTRAMUSCULAR | Status: AC
  Administered 2016-10-23: 4 mg via INTRAVENOUS
  Filled 2016-10-23: qty 2

## 2016-10-23 MED ORDER — ONDANSETRON HCL 4 MG/2ML IJ SOLN
4.0000 mg | Freq: Once | INTRAMUSCULAR | Status: AC
  Administered 2016-10-23: 4 mg via INTRAVENOUS

## 2016-10-23 MED ORDER — IOPAMIDOL (ISOVUE-300) INJECTION 61%
75.0000 mL | Freq: Once | INTRAVENOUS | Status: AC | PRN
  Administered 2016-10-23: 75 mL via INTRAVENOUS
  Filled 2016-10-23: qty 75

## 2016-10-23 MED ORDER — IOPAMIDOL (ISOVUE-300) INJECTION 61%
30.0000 mL | Freq: Once | INTRAVENOUS | Status: AC | PRN
  Administered 2016-10-23: 30 mL via ORAL
  Filled 2016-10-23: qty 30

## 2016-10-23 MED ORDER — IBUPROFEN 600 MG PO TABS: 600.0000 mg | ORAL_TABLET | Freq: Four times a day (QID) | ORAL | 0 refills | Status: DC | PRN

## 2016-10-23 MED ORDER — SODIUM CHLORIDE 0.9 % IV BOLUS (SEPSIS)
1000.0000 mL | Freq: Once | INTRAVENOUS | Status: AC
  Administered 2016-10-23: 1000 mL via INTRAVENOUS

## 2016-10-23 MED ORDER — ONDANSETRON HCL 4 MG PO TABS: 4.0000 mg | ORAL_TABLET | Freq: Three times a day (TID) | ORAL | 0 refills | Status: DC | PRN

## 2016-10-23 NOTE — ED Provider Notes (Signed)
[facility] Emergency Department Provider Note  ____________________________________________  Time seen: Approximately 8:36 PM  I have reviewed the triage vital signs and the nursing notes.   HISTORY  Chief Complaint Nausea; Emesis; and Abdominal Pain   [facility] is a 21 y.o. female no significant past medical history who presents for evaluation of abdominal pain. Patient reports that she felt unwell the entire day today with significant nausea and multiple episodes of nonbloody nonbilious emesis. Patient also had low-grade fever and chills. Around afternoon time she started having right lower quadrant abdominal pain that she describes as dull radiating to her lower back and constant. Patient currently endorsesmoderate pain that has been constant since this afternoon. No diarrhea, constipation, no vaginal discharge, no dysuria or hematuria, no chest pain or shortness of breath. Patient is currently in the last day of menstrual period.   Past Medical History:  Diagnosis Date  . 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 Headache    migraine- w/o aura    Patient Active Problem List   Diagnosis Date Noted  . Anemia of pregnancy in third trimester 04/27/2016  . Labor and delivery, indication for care 04/25/2016  . Irregular uterine contractions 04/15/2016  . Excessive weight gain during pregnancy in second trimester 01/12/2016  . Supervision of normal pregnancy in third trimester 01/12/2016  . Underweight 12/13/2015  . Migraine without aura and without status migrainosus, not intractable 10/17/2015    Past Surgical History:  Procedure Laterality Date  . wisdom teeth pulled      Prior to Admission medications   Medication Sig Start Date End Date Taking? Authorizing Provider  ferrous sulfate (FERROUSUL) 325 (65 FE) MG tablet Take 1 tablet (325 mg total) by mouth 2 (two) times  daily. 04/27/16   Hildred LaserAnika Cherry, MD  ibuprofen (ADVIL,MOTRIN) 600 MG tablet Take 1 tablet (600 mg total) by mouth every 6 (six) hours as needed. 10/23/16   Nita Sicklearolina Delma Villalva, MD  ondansetron (ZOFRAN) 4 MG tablet Take 1 tablet (4 mg total) by mouth every 8 (eight) hours as needed for nausea or vomiting. 10/23/16   Nita Sicklearolina Yajahira Tison, MD  oxyCODONE-acetaminophen (ROXICET) 5-325 MG tablet Take 1 tablet by mouth every 6 (six) hours as needed. 10/23/16 10/23/17  Nita Sicklearolina Jadelynn Boylan, MD  sertraline (ZOLOFT) 50 MG tablet Take 50 mg by mouth daily.    Historical Provider, MD    Allergies Patient has no known allergies.  Family History  Problem Relation Age of Onset  . Hypertension Father   . Diabetes Neg Hx   . Cancer Neg Hx   . Hearing loss Neg Hx     Social History Social History  Substance Use Topics  . Smoking status: Never Smoker  . Smokeless tobacco: Never Used  . Alcohol use No    Review of Systems  Constitutional: + fever and chills Eyes: Negative for visual changes. ENT: Negative for sore throat. Neck: No neck pain  Cardiovascular: Negative for chest pain. Respiratory: Negative for shortness of breath. Gastrointestinal: + RLQ abdominal pain, nausea, and vomiting. No diarrhea. Genitourinary: Negative for dysuria. Musculoskeletal: Negative for back pain. Skin: Negative for rash. Neurological: Negative for headaches, weakness or numbness. Psych: No SI or HI  ____________________________________________   PHYSICAL EXAM:  VITAL SIGNS: ED Triage Vitals [10/23/16 1745]  Enc Vitals Group     BP 115/80     Pulse Rate (!) 106     Resp 16  Temp 99.8 F (37.7 C)     Temp Source Oral     SpO2 100 %     Weight 118 lb (53.5 kg)     Height 5\' 2"  (1.575 m)     Head Circumference      Peak Flow      Pain Score 5     Pain Loc      Pain Edu?      Excl. in GC?     Constitutional: Alert and oriented, looks uncomfortable. Well appearing and in no apparent distress. HEENT:       Head: Normocephalic and atraumatic.         Eyes: Conjunctivae are normal. Sclera is non-icteric. EOMI. PERRL      Mouth/Throat: Mucous membranes are moist.       Neck: Supple with no signs of meningismus. Cardiovascular: Tachycardic with regular rhythm. No murmurs, gallops, or rubs. 2+ symmetrical distal pulses are present in all extremities. No JVD. Respiratory: Normal respiratory effort. Lungs are clear to auscultation bilaterally. No wheezes, crackles, or rhonchi.  Gastrointestinal: Soft, ttp over the RLQ, and non distended with positive bowel sounds. No rebound or guarding. Genitourinary: No CVA tenderness. Musculoskeletal: Nontender with normal range of motion in all extremities. No edema, cyanosis, or erythema of extremities. Neurologic: Normal speech and language. Face is symmetric. Moving all extremities. No gross focal neurologic deficits are appreciated. Skin: Skin is warm, dry and intact. No rash noted. Psychiatric: Mood and affect are normal. Speech and behavior are normal.  ____________________________________________   LABS (all labs ordered are listed, but only abnormal results are displayed)  Labs Reviewed  COMPREHENSIVE METABOLIC PANEL - Abnormal; Notable for the following:       Result Value   Total Protein 8.6 (*)    All other components within normal limits  CBC - Abnormal; Notable for the following:    WBC 11.7 (*)    All other components within normal limits  URINALYSIS, COMPLETE (UACMP) WITH MICROSCOPIC - Abnormal; Notable for the following:    Color, Urine YELLOW (*)    APPearance CLEAR (*)    Hgb urine dipstick MODERATE (*)    Ketones, ur 20 (*)    Squamous Epithelial / LPF 0-5 (*)    All other components within normal limits  LIPASE, BLOOD  POC URINE PREG, ED  POCT PREGNANCY, URINE   ____________________________________________  EKG  none ____________________________________________  RADIOLOGY  CT a/p: 1. Mild right hydroureter, likely related  to a recently passed right renal stone. No obstructing stone identified. Correlation with urinalysis recommended to exclude UTI. 2. No evidence of bowel obstruction or active inflammation. Normal appendix. ____________________________________________   PROCEDURES  Procedure(s) performed: None Procedures Critical Care performed:  None ____________________________________________   INITIAL IMPRESSION / ASSESSMENT AND PLAN / ED COURSE  21 y.o. female no significant past medical history who presents for evaluation of RLQ abdominal pain, low grade fever, chills, nausea, and multiple episodes of NBNB emesis. Patient looks uncomfortable, she has a low-grade fever of 99.8 and mildly tachycardic with heart rate of 106, she is tender to palpation in the right lower quadrant with no rebound or guarding concerning for appendicitis. We'll plan for CT abdomen and pelvis. We'll give IV fluids and IV Zofran. Patient declined pain medication. Patient made nothing by mouth.  Clinical Course as of Oct 24 2230  Wed Oct 23, 2016  2206 CT concerning for mild right hydronephrosis due to a possible recently passed kidney stone versus a small  stone that is being obstructed by IV contrast. No evidence of UTI. Appendix is normal otherwise. Patient feels markedly improved and still complaining of mild 3 out of 10 right flank pain. We'll discharge home with ibuprofen, Zofran, and close follow-up with PCP.  [CV]    Clinical Course User Index [CV] Nita Sickle, MD    Pertinent labs & imaging results that were available during my care of the patient were reviewed by me and considered in my medical decision making (see chart for details).    ____________________________________________   FINAL CLINICAL IMPRESSION(S) / ED DIAGNOSES  Final diagnoses:  Right kidney stone      NEW MEDICATIONS STARTED DURING THIS VISIT:  Discharge Medication List as of 10/23/2016 10:09 PM    START taking these  medications   Details  ibuprofen (ADVIL,MOTRIN) 600 MG tablet Take 1 tablet (600 mg total) by mouth every 6 (six) hours as needed., Starting Wed 10/23/2016, Print    ondansetron (ZOFRAN) 4 MG tablet Take 1 tablet (4 mg total) by mouth every 8 (eight) hours as needed for nausea or vomiting., Starting Wed 10/23/2016, Print    oxyCODONE-acetaminophen (ROXICET) 5-325 MG tablet Take 1 tablet by mouth every 6 (six) hours as needed., Starting Wed 10/23/2016, Until Thu 10/23/2017, Print         Note:  This document was prepared using Dragon voice recognition software and may include unintentional dictation errors.    Nita Sickle, MD 10/23/16 2232

## 2016-10-23 NOTE — Discharge Instructions (Signed)
You have been seen in the Emergency Department (ED)  Today and was diagnosed with kidney stones. While the stone is traveling through the ureter, which is the tube that carries urine from the kidney to the bladder, you will probably feel pain. The pain may be mild or very severe. You may also have some blood in your urine. As soon as the stone reaches the bladder, any intense pain should go away. If a stone is too large to pass on its own, you may need a medical procedure to help you pass the stone.  ° °As we have discussed, please drink plenty of fluids and use a urinary strainer to attempt to capture the stone.  Please make a follow up appointment with Urology in the next week by calling the number below and bring the stone with you. ° °Take ibuprofen 600mg every 6 hours for the pain. If the pain is not well controlled with ibuprofen you may take one percocet every 4 hours. Do not take tylenol while taking percocet. Check with your doctor if you have a history of gastritis, stomach ulcers, renal failure or impaired kidney function as you may not be able to take ibuprofen/ motrin. Your doctor can give you a different prescription for pain control. ° °Follow-up with your doctor or return to the ER in 12-24 hours if your pain is not well controlled, if you develop pain or burning with urination, or if you develop a fever. Otherwise follow up in 3-5 days with your doctor. ° °When should you call for help?  °Call your doctor now or seek immediate medical care if:  °You cannot keep down fluids.  °Your pain gets worse.  °You have a fever or chills.  °You have new or worse pain in your back just below your rib cage (the flank area).  °You have new or more blood in your urine. °You have pain or burning with urination °You are unable to urinate °You have abdominal pain ° °Watch closely for changes in your health, and be sure to contact your doctor if:  °You do not get better as expected ° °How can you care for yourself at  home?  °Drink plenty of fluids, enough so that your urine is light yellow or clear like water. If you have kidney, heart, or liver disease and have to limit fluids, talk with your doctor before you increase the amount of fluids you drink.  °Take pain medicines exactly as directed. Call your doctor if you think you are having a problem with your medicine.  °If the doctor gave you a prescription medicine for pain, take it as prescribed.  °If you are not taking a prescription pain medicine, ask your doctor if you can take an over-the-counter medicine. Read and follow all instructions on the label. °Your doctor may ask you to strain your urine so that you can collect your kidney stone when it passes. You can use a kitchen strainer or a tea strainer to catch the stone. Store it in a plastic bag until you see your doctor again. ° °Preventing future kidney stones  °Some changes in your diet may help prevent kidney stones. Depending on the cause of your stones, your doctor may recommend that you:  °Drink plenty of fluids, enough so that your urine is light yellow or clear like water. If you have kidney, heart, or liver disease and have to limit fluids, talk with your doctor before you increase the amount of fluids you drink.  °  Limit coffee, tea, and alcohol. Also avoid grapefruit juice.  Do not take more than the recommended daily dose of vitamins C and D.  Avoid antacids such as Gaviscon, Maalox, Mylanta, or Tums.  Limit the amount of salt (sodium) in your diet.  Eat a balanced diet that is not too high in protein.  Limit foods that are high in a substance called oxalate, which can cause kidney stones. These foods include dark green vegetables, rhubarb, chocolate, wheat bran, nuts, cranberries, and beans.

## 2016-10-23 NOTE — ED Triage Notes (Signed)
Pt reports that she began this am having nausea and vomiting with rt lower abd pain that radiates into rt flank.

## 2017-01-15 ENCOUNTER — Encounter: Payer: Medicaid Other | Admitting: Obstetrics and Gynecology

## 2017-03-11 ENCOUNTER — Encounter: Payer: Self-pay | Admitting: Emergency Medicine

## 2017-03-11 ENCOUNTER — Emergency Department
Admission: EM | Admit: 2017-03-11 | Discharge: 2017-03-11 | Disposition: A | Discharge status: Home / Self Care | Payer: Medicaid Other | Attending: Emergency Medicine | Admitting: Emergency Medicine

## 2017-03-11 DIAGNOSIS — Z79899 Other long term (current) drug therapy: Secondary | ICD-10-CM | POA: Insufficient documentation

## 2017-03-11 DIAGNOSIS — B084 Enteroviral vesicular stomatitis with exanthem: Secondary | ICD-10-CM | POA: Insufficient documentation

## 2017-03-11 MED ORDER — ACETAMINOPHEN-CODEINE #3 300-30 MG PO TABS: 1.0000 | ORAL_TABLET | Freq: Three times a day (TID) | ORAL | 0 refills | Status: DC | PRN

## 2017-03-11 NOTE — ED Notes (Signed)
Pt to ed with blisters to bilat hands and bilat feet.  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

## 2017-03-11 NOTE — ED Provider Notes (Signed)
Marcia Hodges ____________________________________________  Time seen: 1223  Marcia Hodges is a 21 y.o. female presents to the ED for evaluation of a rash noted to the palms of her hands and the soles of feet the last 2 days. Patient missed her son was recently diagnosed with hand-foot-and-mouth disease. She denies any interim fevers, chills, sweats.  Past Medical History:  Diagnosis Date  . 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 Headache    migraine- w/o aura    Patient Active Problem List   Diagnosis Date Noted  . Anemia of pregnancy in third trimester 04/27/2016  . Labor and delivery, indication for care 04/25/2016  . Irregular uterine contractions 04/15/2016  . Excessive weight gain during pregnancy in second trimester 01/12/2016  . Supervision of normal pregnancy in third trimester 01/12/2016  . Underweight 12/13/2015  . Migraine without aura and without status migrainosus, not intractable 10/17/2015    Past Surgical History:  Procedure Laterality Date  . wisdom teeth pulled      Prior to Admission medications   Medication Sig Start Date End Date Taking? Authorizing Provider  acetaminophen-codeine (TYLENOL #3) 300-30 MG tablet Take 1 tablet by mouth every 8 (eight) hours as needed for moderate pain. 03/11/17   Marcia Hodges, Marcia IvoryJenise V Bacon, PA-C  ferrous sulfate (FERROUSUL) 325 (65 FE) MG tablet Take 1 tablet (325 mg total) by mouth 2 (two) times daily. 04/27/16   Marcia Hodges, Anika, Marcia Hodges  ibuprofen (ADVIL,MOTRIN) 600 MG tablet Take 1 tablet (600 mg total) by mouth every 6 (six) hours as needed. 10/23/16   Marcia Hodges, [place], Marcia Hodges  ondansetron (ZOFRAN) 4 MG tablet Take 1 tablet (4 mg total) by mouth every 8 (eight) hours as needed for nausea or vomiting. 10/23/16    Marcia PerkingVeronese, [place], Marcia Hodges  oxyCODONE-acetaminophen (ROXICET) 5-325 MG tablet Take 1 tablet by mouth every 6 (six) hours as needed. 10/23/16 10/23/17  Marcia Hodges, [place], Marcia Hodges  sertraline (ZOLOFT) 50 MG tablet Take 50 mg by mouth daily.    Provider, Historical, Marcia Hodges    Allergies Patient has no known allergies.  Family History  Problem Relation Age of Onset  . Hypertension Father   . Diabetes Neg Hx   . Cancer Neg Hx   . Hearing loss Neg Hx     Social History Social History  Substance Use Topics  . Smoking status: Never Smoker  . Smokeless tobacco: Never Used  . Alcohol use No    Review of Systems  Constitutional: Negative for fever. Eyes: Negative for visual changes. ENT: Negative for sore throat. Cardiovascular: Negative for chest pain. Respiratory: Negative for shortness of breath. Gastrointestinal: Negative for abdominal pain, vomiting and diarrhea. Musculoskeletal: Negative for back pain. Skin: Positive for rash. Neurological: Negative for headaches, focal weakness or numbness. ____________________________________________  PHYSICAL EXAM:  VITAL SIGNS: ED Triage Vitals [03/11/17 1211]  Enc Vitals Group     BP 116/86     Pulse Rate 83     Resp 16     Temp 98.3 F (36.8 C)     Temp Source Oral     SpO2 100 %     Weight 118 lb (53.5 kg)     Height 5\' 2"  (1.575 m)     Head Circumference      Peak Flow  Pain Score      Pain Loc      Pain Edu?      Excl. in GC?     Constitutional: Alert and oriented. Well appearing and in no distress. Head: Normocephalic and atraumatic. Eyes: Conjunctivae are normal. PERRL. Normal extraocular movements Ears: Canals clear. TMs intact bilaterally. Nose: No congestion/rhinorrhea/epistaxis. Mouth/Throat: Mucous membranes are moist.  Neck: Supple. No thyromegaly. Hematological/Lymphatic/Immunological: No cervical lymphadenopathy. Cardiovascular: Normal rate, regular rhythm. Normal distal pulses. Respiratory: Normal respiratory  effort. No wheezes/rales/rhonchi. Musculoskeletal: Nontender with normal range of motion in all extremities.  Neurologic:  Normal gait without ataxia. Normal speech and language. No gross focal neurologic deficits are appreciated. Skin:  Skin is warm, dry and intact. Patient with multiple palpable erythematous flat papules over the palms and soles bilaterally. ____________________________________________  INITIAL IMPRESSION / ASSESSMENT AND PLAN / ED COURSE  Patient with the ED evaluation of a rash consistent with hand-foot-and-mouth disease. Patient is discharged with instructions on management of symptoms related to the self-limited viral illness. A pressure Of the Codeine Is Provided for More Moderate Pain. Report Was Also Provided for 3 Days As Requested. She Will Follow-Up with Her Primary Care Provider or Claudette Laws for Ongoing Symptom Management. ____________________________________________  FINAL CLINICAL IMPRESSION(S) / ED DIAGNOSES  Final diagnoses:  Hand, foot and mouth disease      Lissa Hoard, PA-C 03/11/17 1254    Jene Every, Marcia Hodges 03/11/17 1430

## 2017-03-11 NOTE — Discharge Instructions (Signed)
Your symptoms are consistent with HFMD. Take the pain medicine along with OTC ibuprofen as needed. Rest and hydrate. [facility]

## 2017-03-11 NOTE — ED Notes (Signed)
Pt walked to room unassisted with no problems. Pt was ask to remove shoes and socks.

## 2017-03-11 NOTE — ED Notes (Signed)
Signature pad not working in room, pt verbalized understanding of d/c instructions.

## 2017-03-11 NOTE — ED Triage Notes (Signed)
Rash to palms of hands and soles of feet x 2 days.  States son recently had hand, foot, mouth virus.

## 2017-04-08 IMAGING — CT CT ANGIO CHEST
2 of 6 series · 18 of 46 positions shown · IV contrast (APPLIED)
Comparison: None.

CLINICAL DATA: Pregnant patient with shortness of breath.
Difficulty taking a deep breath. Intermittent heart racing. Symptoms
for 2 weeks.

EXAM:
CT ANGIOGRAPHY CHEST WITH CONTRAST
TECHNIQUE: Multidetector CT imaging of the chest was performed using the
standard protocol during bolus administration of intravenous
contrast. Multiplanar CT image reconstructions and MIPs were
obtained to evaluate the vascular anatomy.
CONTRAST:  100 cc Isovue 370.

[Series 5: pe thins 1.5 · axial · 0.68mm/px · z∈[-728,-540]mm · 15 of 176 slices shown]
[im 10/176  lung]
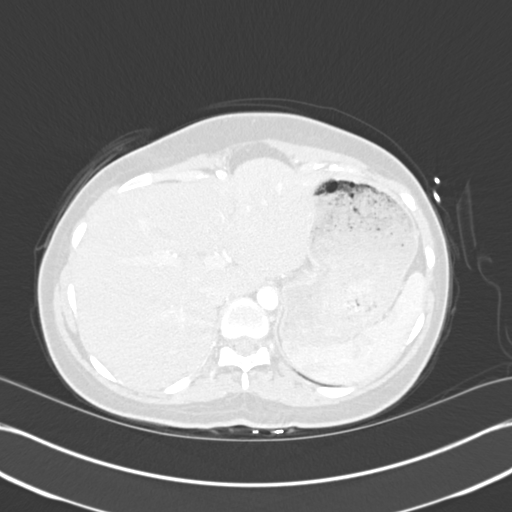
[im 19/176  soft-tissue]
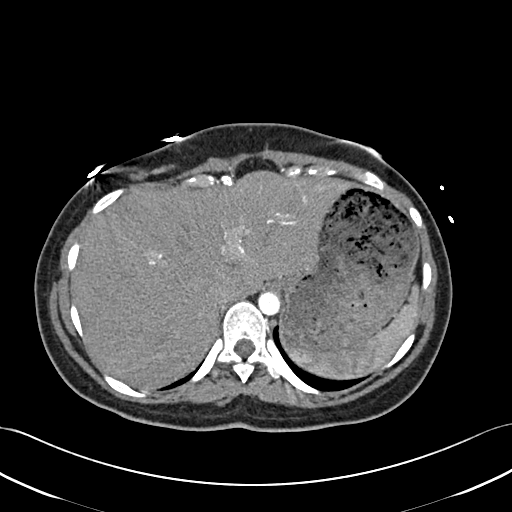
[im 37/176  lung]
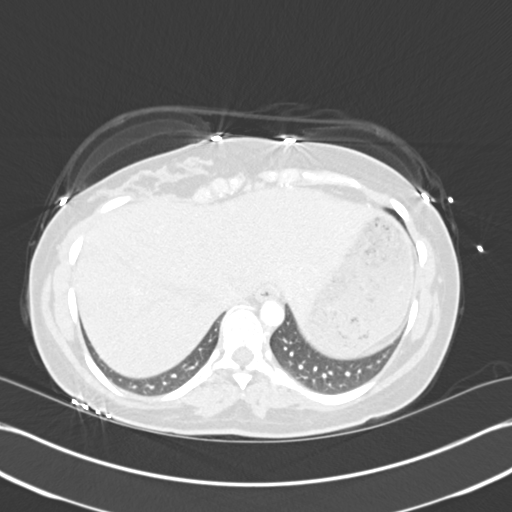
[im 47/176  soft-tissue]
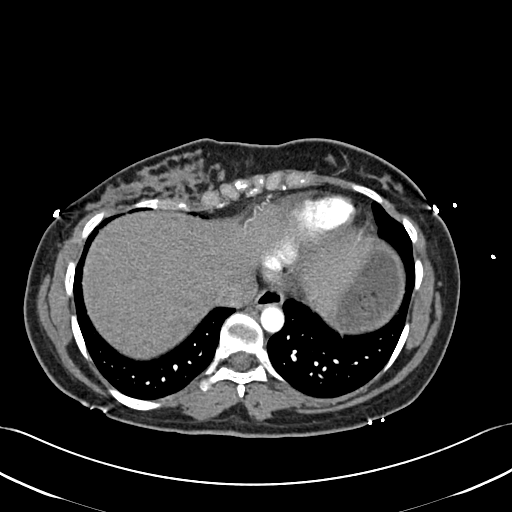
[im 56/176  lung]
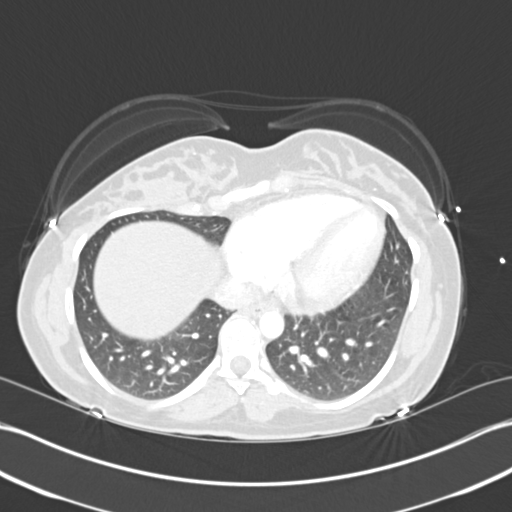
[im 65/176  soft-tissue]
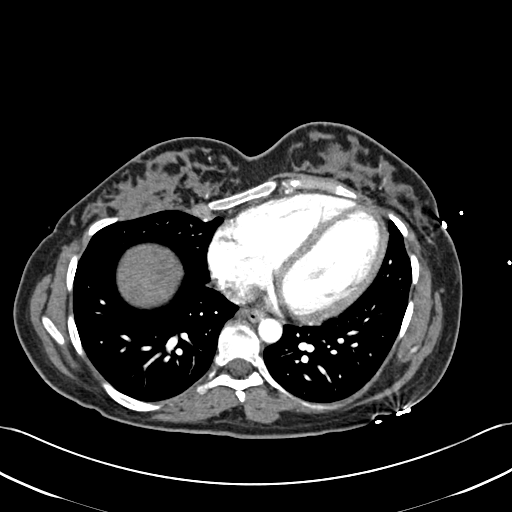
[im 74/176  lung]
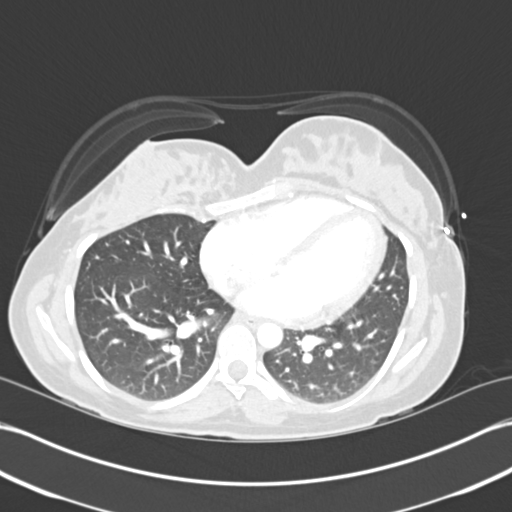
[im 93/176  soft-tissue]
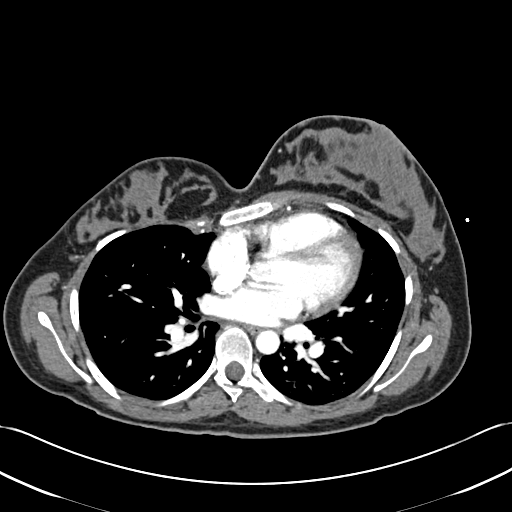
[im 102/176  lung]
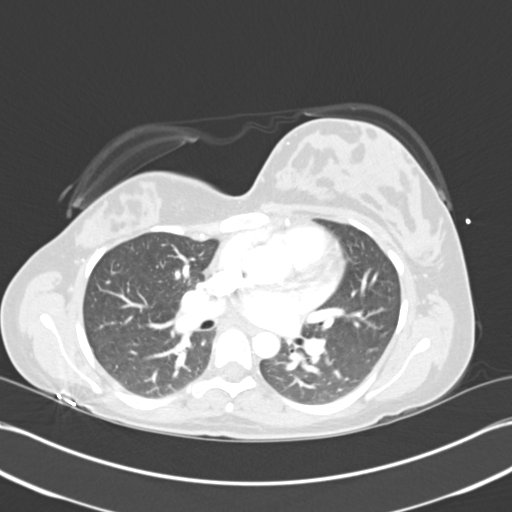
[im 111/176  soft-tissue]
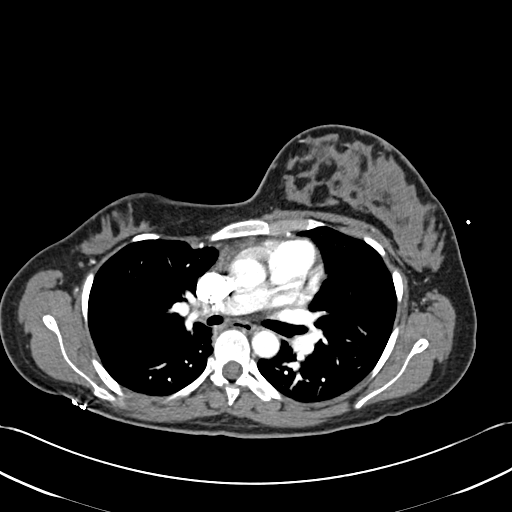
[im 120/176  lung]
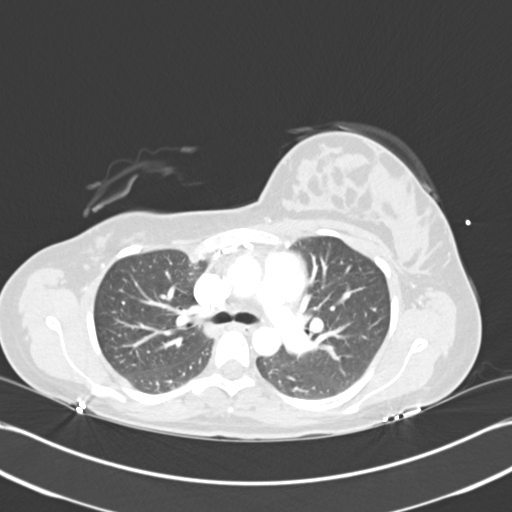
[im 129/176  soft-tissue]
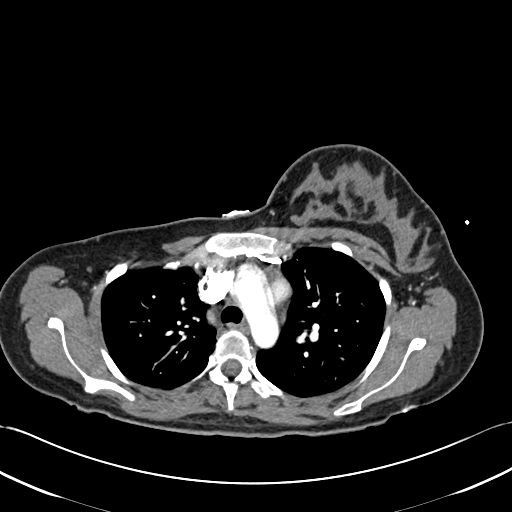
[im 148/176  lung]
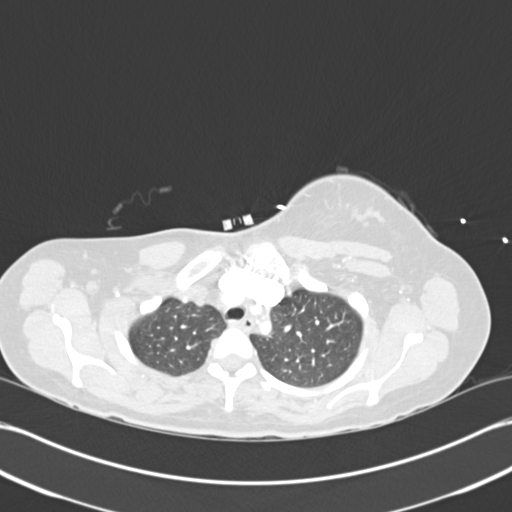
[im 157/176  soft-tissue]
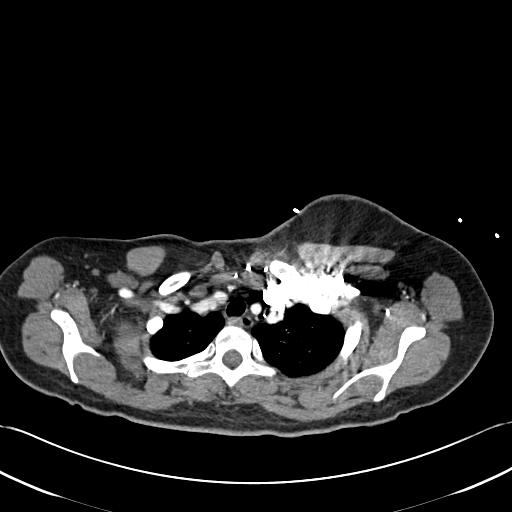
[im 166/176  lung]
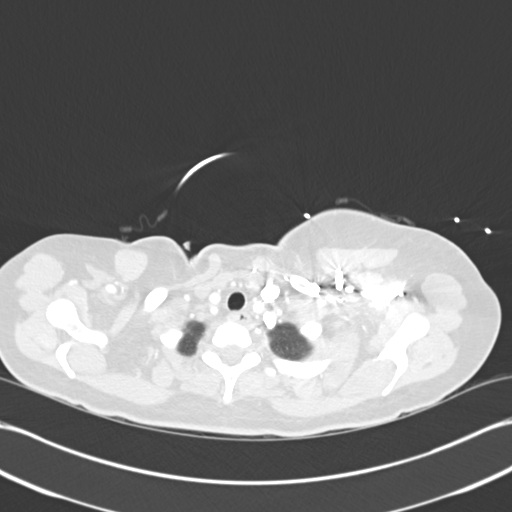

[Series 7: cor mpr 2.0 · coronal · 0.43mm/px · 3 of 108 slices shown]
[im 27/108  soft-tissue]
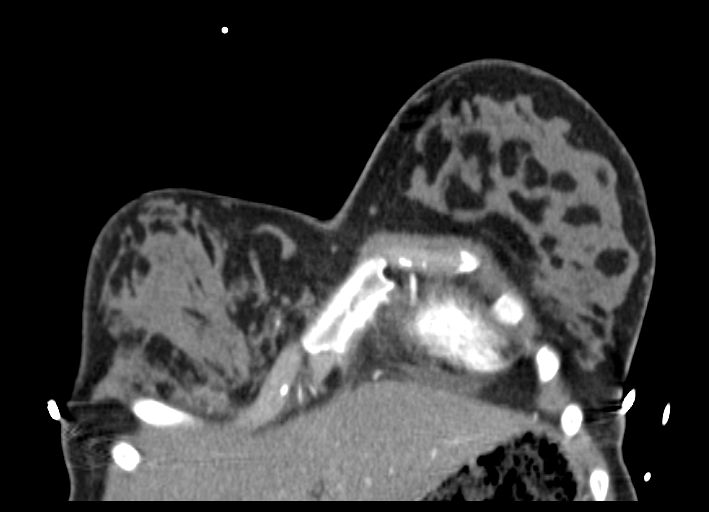
[im 54/108  soft-tissue]
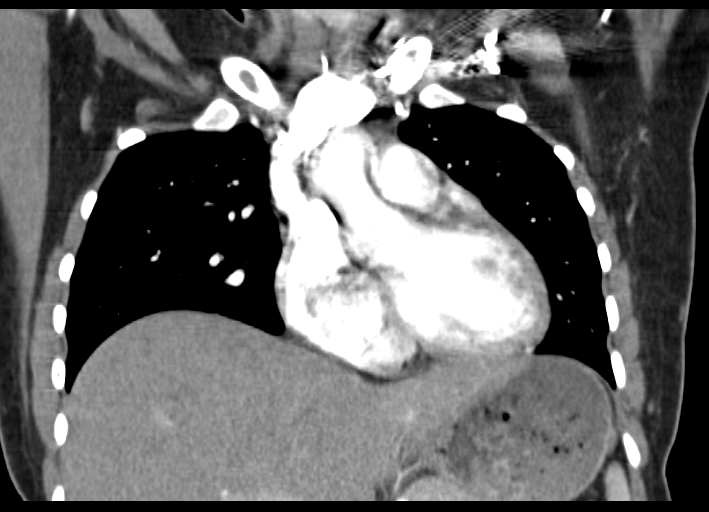
[im 81/108  soft-tissue]
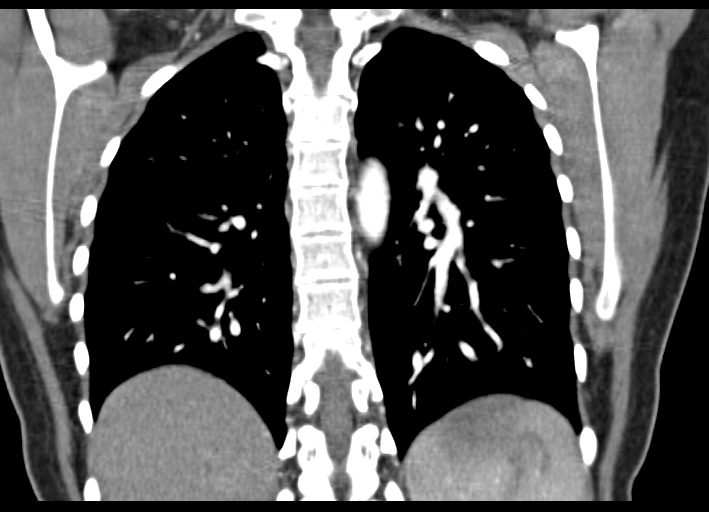

[18 of 46 positions shown; findings below may reference images not displayed]

FINDINGS: No pulmonary embolus is identified. Heart size is normal. No pleural
or pericardial effusion. No axillary, hilar or mediastinal
lymphadenopathy. The lungs are clear.

Visualized upper abdomen is unremarkable. No bony abnormality is
identified.

Review of the MIP images confirms the above findings.
IMPRESSION: Negative for pulmonary embolus.  Negative chest CT.

## 2018-03-27 ENCOUNTER — Emergency Department
Admission: EM | Admit: 2018-03-27 | Discharge: 2018-03-27 | Disposition: A | Discharge status: Home / Self Care | Payer: Medicaid Other | Attending: Emergency Medicine | Admitting: Emergency Medicine

## 2018-03-27 ENCOUNTER — Encounter: Payer: Self-pay | Admitting: Emergency Medicine

## 2018-03-27 ENCOUNTER — Emergency Department: Payer: Medicaid Other

## 2018-03-27 DIAGNOSIS — Z3A01 Less than 8 weeks gestation of pregnancy: Secondary | ICD-10-CM | POA: Insufficient documentation

## 2018-03-27 DIAGNOSIS — O99280 Endocrine, nutritional and metabolic diseases complicating pregnancy, unspecified trimester: Secondary | ICD-10-CM | POA: Insufficient documentation

## 2018-03-27 DIAGNOSIS — R102 Pelvic and perineal pain: Secondary | ICD-10-CM

## 2018-03-27 DIAGNOSIS — E876 Hypokalemia: Secondary | ICD-10-CM | POA: Diagnosis not present

## 2018-03-27 DIAGNOSIS — R1031 Right lower quadrant pain: Secondary | ICD-10-CM | POA: Diagnosis not present

## 2018-03-27 DIAGNOSIS — Z79899 Other long term (current) drug therapy: Secondary | ICD-10-CM | POA: Insufficient documentation

## 2018-03-27 DIAGNOSIS — O26891 Other specified pregnancy related conditions, first trimester: Secondary | ICD-10-CM | POA: Diagnosis not present

## 2018-03-27 LAB — CBC
HCT: 37.2 % (ref 35.0–47.0)
Hemoglobin: 12.7 g/dL (ref 12.0–16.0)
MCH: 28.7 pg (ref 26.0–34.0)
MCHC: 34.3 g/dL (ref 32.0–36.0)
MCV: 83.9 fL (ref 80.0–100.0)
PLATELETS: 314 10*3/uL (ref 150–440)
RBC: 4.43 MIL/uL (ref 3.80–5.20)
RDW: 13.9 % (ref 11.5–14.5)
WBC: 11.2 10*3/uL — AB (ref 3.6–11.0)

## 2018-03-27 LAB — COMPREHENSIVE METABOLIC PANEL
ALT: 21 U/L (ref 0–44)
ANION GAP: 7 (ref 5–15)
AST: 19 U/L (ref 15–41)
Albumin: 4.1 g/dL (ref 3.5–5.0)
Alkaline Phosphatase: 66 U/L (ref 38–126)
BILIRUBIN TOTAL: 0.4 mg/dL (ref 0.3–1.2)
BUN: 8 mg/dL (ref 6–20)
CHLORIDE: 103 mmol/L (ref 98–111)
CO2: 27 mmol/L (ref 22–32)
Calcium: 9.4 mg/dL (ref 8.9–10.3)
Creatinine, Ser: 0.6 mg/dL (ref 0.44–1.00)
GFR calc Af Amer: >60 mL/min (ref >60)
Glucose, Bld: 108 mg/dL — ABNORMAL HIGH (ref 70–99)
Potassium: 3.2 mmol/L — ABNORMAL LOW (ref 3.5–5.1)
Sodium: 137 mmol/L (ref 135–145)
TOTAL PROTEIN: 8.1 g/dL (ref 6.5–8.1)

## 2018-03-27 LAB — URINALYSIS, COMPLETE (UACMP) WITH MICROSCOPIC
Bacteria, UA: NONE SEEN
Bilirubin Urine: NEGATIVE
GLUCOSE, UA: NEGATIVE mg/dL
Hgb urine dipstick: NEGATIVE
Ketones, ur: NEGATIVE mg/dL
LEUKOCYTES UA: NEGATIVE
Nitrite: NEGATIVE
PH: 7 (ref 5.0–8.0)
Protein, ur: NEGATIVE mg/dL
Specific Gravity, Urine: 1.008 (ref 1.005–1.030)

## 2018-03-27 LAB — POCT PREGNANCY, URINE: PREG TEST UR: POSITIVE — AB

## 2018-03-27 LAB — HCG, QUANTITATIVE, PREGNANCY: HCG, BETA CHAIN, QUANT, S: 16999 m[IU]/mL — AB (ref <5)

## 2018-03-27 MED ORDER — PRENATAL VITAMINS 28-0.8 MG PO TABS: 1.0000 | ORAL_TABLET | Freq: Every day | ORAL | 1 refills | Status: DC

## 2018-03-27 MED ORDER — POTASSIUM CHLORIDE ER 10 MEQ PO TBCR: 10.0000 meq | EXTENDED_RELEASE_TABLET | Freq: Two times a day (BID) | ORAL | 0 refills | Status: DC

## 2018-03-27 MED ORDER — POTASSIUM CHLORIDE CRYS ER 20 MEQ PO TBCR
40.0000 meq | EXTENDED_RELEASE_TABLET | Freq: Once | ORAL | Status: AC
  Administered 2018-03-27: 40 meq via ORAL
  Filled 2018-03-27: qty 2

## 2018-03-27 NOTE — ED Notes (Signed)
See triage note  States she found out that she is approx 5 weeks preg last week  Stats she has been having right sided abd pain which has been intermittent   States when she rolled over this am the pain became sharp    Denies any n/v/ or vaginal bleeding

## 2018-03-27 NOTE — Discharge Instructions (Addendum)
Your ultrasound shows an early pregnancy at about 5 weeks but has not yet shown a heartbeat, which is normal this early in a pregnancy.  Because you have been having abdominal pain, please return to the emergency department for repeat blood work in 48 hours. You will also need a repeat ultrasound in 8-10 days. Return to the emergency department sooner for increasing abdominal pain or vaginal bleeding.

## 2018-03-27 NOTE — ED Provider Notes (Signed)
[facility] Emergency Department Provider Note       Time seen: ----------------------------------------- 2:25 PM on 03/27/2018 -----------------------------------------   I have reviewed the triage vital signs and the nursing notes.  HISTORY   Chief Complaint Possible Pregnancy and Abdominal Cramping    HPI Marcia Hodges is a 22 y.o. female with no significant past medical history who presents to the ED for abdominal pain and early pregnancy.  Patient found out she is approximately [redacted] weeks pregnant last week.  Patient states she is been having right-sided intermittent abdominal pain.  She rolled over this morning and the pain became sharp.  She denies nausea, vomiting or vaginal bleeding.  She is G2, P1 Ab0  Past Medical History:  Diagnosis Date  . 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 Headache    migraine- w/o aura    Patient Active Problem List   Diagnosis Date Noted  . Anemia of pregnancy in third trimester 04/27/2016  . Labor and delivery, indication for care 04/25/2016  . Irregular uterine contractions 04/15/2016  . Excessive weight gain during pregnancy in second trimester 01/12/2016  . Supervision of normal pregnancy in third trimester 01/12/2016  . Underweight 12/13/2015  . Migraine without aura and without status migrainosus, not intractable 10/17/2015    Past Surgical History:  Procedure Laterality Date  . wisdom teeth pulled      Allergies Patient has no known allergies.  Social History Social History   Tobacco Use  . Smoking status: Never Smoker  . Smokeless tobacco: Never Used  Substance Use Topics  . Alcohol use: No    Alcohol/week: 0.0 standard drinks  . Drug use: No   Review of Systems Constitutional: Negative for fever. Cardiovascular: Negative for chest pain. Respiratory: Negative for shortness of breath. Gastrointestinal: Positive for  abdominal pain Genitourinary: Negative for dysuria, negative for dysuria Musculoskeletal: Negative for back pain. Skin: Negative for rash. Neurological: Negative for headaches, focal weakness or numbness.  All systems negative/normal/unremarkable except as stated in the HPI  ____________________________________________   PHYSICAL EXAM:  VITAL SIGNS: ED Triage Vitals  Enc Vitals Group     BP 03/27/18 1307 113/76     Pulse Rate 03/27/18 1307 88     Resp 03/27/18 1307 20     Temp 03/27/18 1307 98.4 F (36.9 C)     Temp Source 03/27/18 1307 Oral     SpO2 03/27/18 1307 99 %     Weight 03/27/18 1309 130 lb (59 kg)     Height 03/27/18 1309 5\' 2"  (1.575 m)     Head Circumference --      Peak Flow --      Pain Score 03/27/18 1309 6     Pain Loc --      Pain Edu? --      Excl. in GC? --    Constitutional: Alert and oriented. Well appearing and in no distress. Eyes: Conjunctivae are normal. Normal extraocular movements. ENT   Head: Normocephalic and atraumatic.   Nose: No congestion/rhinnorhea.   Mouth/Throat: Mucous membranes are moist.   Neck: No stridor. Cardiovascular: Normal rate, regular rhythm. No murmurs, rubs, or gallops. Respiratory: Normal respiratory effort without tachypnea nor retractions. Breath sounds are clear and equal bilaterally. No wheezes/rales/rhonchi. Gastrointestinal: Nonfocal right lower abdominal tenderness, no rebound or guarding.  Normal bowel sounds. Musculoskeletal: Nontender with normal range of motion in extremities. No lower extremity tenderness nor edema. Neurologic:  Normal speech and language. No gross focal neurologic deficits are appreciated.  Skin:  Skin is warm, dry and intact. No rash noted. Psychiatric: Mood and affect are normal. Speech and behavior are normal.  ____________________________________________  ED COURSE:  As part of my medical decision making, I reviewed the following data within the electronic medical  record:  History obtained from family if available, nursing notes, old chart and ekg, as well as notes from prior ED visits. Patient presented for abdominal pain in early pregnancy, we will assess with labs and imaging as indicated at this time.   Procedures ____________________________________________   LABS (pertinent positives/negatives)  Labs Reviewed  HCG, QUANTITATIVE, PREGNANCY - Abnormal; Notable for the following components:      Result Value   hCG, Beta Chain, Quant, S 16,999 (*)    All other components within normal limits  URINALYSIS, COMPLETE (UACMP) WITH MICROSCOPIC - Abnormal; Notable for the following components:   Color, Urine STRAW (*)    APPearance CLEAR (*)    All other components within normal limits  CBC - Abnormal; Notable for the following components:   WBC 11.2 (*)    All other components within normal limits  COMPREHENSIVE METABOLIC PANEL - Abnormal; Notable for the following components:   Potassium 3.2 (*)    Glucose, Bld 108 (*)    All other components within normal limits  POCT PREGNANCY, URINE - Abnormal; Notable for the following components:   Preg Test, Ur POSITIVE (*)    All other components within normal limits  POC URINE PREG, ED    RADIOLOGY  Pregnancy ultrasound Is pending at this time ____________________________________________  DIFFERENTIAL DIAGNOSIS   Normal pregnancy, UTI, ectopic pregnancy, round ligament pain, appendicitis unlikely  FINAL ASSESSMENT AND PLAN  First trimester pregnancy   Plan: The patient had presented for abdominal pain in early pregnancy. Patient's labs did indicate early pregnancy. Patient's imaging is still pending at this time.   Marcia DashJohnathan E Cruise Baumgardner, MD   Note: This note was generated in part or whole with voice recognition software. Voice recognition is usually quite accurate but there are transcription errors that can and very often do occur. I apologize for any typographical errors that were not  detected and corrected.     Emily FilbertWilliams, Veneda Kirksey E, MD 03/27/18 [phone]

## 2018-03-27 NOTE — ED Triage Notes (Signed)
Pt reports is [redacted] weeks pregnant based on her last cycle and is having pain to her RLQ. Pt reports pain is stabbing and intermittent and gets worse when she lays down.

## 2018-03-27 NOTE — ED Provider Notes (Signed)
[facility] Emergency Department Provider Note  ____________________________________________  Time seen: Approximately 8:19 PM  I have reviewed the triage vital signs and the nursing notes.   HISTORY  Chief Complaint Possible Pregnancy and Abdominal Cramping    HPI Marcia Hodges is a 22 y.o. female presents emergency department for evaluation of right-sided abdominal pain in early pregnancy.  Patient found out last week that she is about [redacted] weeks pregnant.  No nausea, vomiting, vaginal bleeding.  Past Medical History:  Diagnosis Date  . 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 Headache    migraine- w/o aura    Patient Active Problem List   Diagnosis Date Noted  . Anemia of pregnancy in third trimester 04/27/2016  . Labor and delivery, indication for care 04/25/2016  . Irregular uterine contractions 04/15/2016  . Excessive weight gain during pregnancy in second trimester 01/12/2016  . Supervision of normal pregnancy in third trimester 01/12/2016  . Underweight 12/13/2015  . Migraine without aura and without status migrainosus, not intractable 10/17/2015    Past Surgical History:  Procedure Laterality Date  . wisdom teeth pulled      Prior to Admission medications   Medication Sig Start Date End Date Taking? Authorizing Provider  acetaminophen-codeine (TYLENOL #3) 300-30 MG tablet Take 1 tablet by mouth every 8 (eight) hours as needed for moderate pain. 03/11/17   Menshew, Charlesetta Ivory, PA-C  ferrous sulfate (FERROUSUL) 325 (65 FE) MG tablet Take 1 tablet (325 mg total) by mouth 2 (two) times daily. 04/27/16   Hildred Laser, MD  ibuprofen (ADVIL,MOTRIN) 600 MG tablet Take 1 tablet (600 mg total) by mouth every 6 (six) hours as needed. 10/23/16   Nita Sickle, MD  ondansetron (ZOFRAN) 4 MG tablet Take 1 tablet (4 mg total) by mouth every 8 (eight) hours as needed for nausea or  vomiting. 10/23/16   Don Perking, [place], MD  potassium chloride (K-DUR) 10 MEQ tablet Take 1 tablet (10 mEq total) by mouth 2 (two) times daily for 4 days. 03/27/18 03/31/18  Enid Derry, PA-C  Prenatal Vit-Fe Fumarate-FA (PRENATAL VITAMINS) 28-0.8 MG TABS Take 1 Dose by mouth daily. 03/27/18   Enid Derry, PA-C  sertraline (ZOLOFT) 50 MG tablet Take 50 mg by mouth daily.    [provider]    Allergies Patient has no known allergies.  Family History  Problem Relation Age of Onset  . Hypertension Father   . Diabetes Neg Hx   . Cancer Neg Hx   . Hearing loss Neg Hx     Social History Social History   Tobacco Use  . Smoking status: Never Smoker  . Smokeless tobacco: Never Used  Substance Use Topics  . Alcohol use: No    Alcohol/week: 0.0 standard drinks  . Drug use: No     Review of Systems  Cardiovascular: No chest pain. Respiratory: No SOB. Gastrointestinal: Positive for abdominal pain.  No nausea, no vomiting.  Musculoskeletal: Negative for musculoskeletal pain. Skin: Negative for rash, abrasions, lacerations, ecchymosis.   ____________________________________________   PHYSICAL EXAM:  VITAL SIGNS: ED Triage Vitals  Enc Vitals Group     BP 03/27/18 1307 113/76     Pulse Rate 03/27/18 1307 88     Resp 03/27/18 1307 20     Temp 03/27/18 1307 98.4 F (36.9 C)     Temp Source 03/27/18 1307 Oral     SpO2 03/27/18 1307 99 %  Weight 03/27/18 1309 130 lb (59 kg)     Height 03/27/18 1309 5\' 2"  (1.575 m)     Head Circumference --      Peak Flow --      Pain Score 03/27/18 1309 6     Pain Loc --      Pain Edu? --      Excl. in GC? --      Constitutional: Alert and oriented. Well appearing and in no acute distress. Eyes: Conjunctivae are normal. PERRL. EOMI. Head: Atraumatic. ENT:      Ears:      Nose: No congestion/rhinnorhea.      Mouth/Throat: Mucous membranes are moist.  Neck: No stridor.  Cardiovascular:  Good peripheral  circulation. Respiratory: Normal respiratory effort without tachypnea or retractions. Gastrointestinal: Bowel sounds 4 quadrants. Mild RLQ tenderness to palpation. No guarding or rigidity. No palpable masses.  Musculoskeletal: Full range of motion to all extremities. No gross deformities appreciated. Neurologic:  Normal speech and language. No gross focal neurologic deficits are appreciated.  Skin:  Skin is warm, dry and intact. No rash noted. Psychiatric: Mood and affect are normal. Speech and behavior are normal. Patient exhibits appropriate insight and judgement.   ____________________________________________   LABS (all labs ordered are listed, but only abnormal results are displayed)  Labs Reviewed  HCG, QUANTITATIVE, PREGNANCY - Abnormal; Notable for the following components:      Result Value   hCG, Beta Chain, Quant, S 16,999 (*)    All other components within normal limits  URINALYSIS, COMPLETE (UACMP) WITH MICROSCOPIC - Abnormal; Notable for the following components:   Color, Urine STRAW (*)    APPearance CLEAR (*)    All other components within normal limits  CBC - Abnormal; Notable for the following components:   WBC 11.2 (*)    All other components within normal limits  COMPREHENSIVE METABOLIC PANEL - Abnormal; Notable for the following components:   Potassium 3.2 (*)    Glucose, Bld 108 (*)    All other components within normal limits  POCT PREGNANCY, URINE - Abnormal; Notable for the following components:   Preg Test, Ur POSITIVE (*)    All other components within normal limits  POC URINE PREG, ED   ____________________________________________  EKG   ____________________________________________  RADIOLOGY Lexine BatonI, Cheralyn Oliver, personally viewed and evaluated these images (plain radiographs) as part of my medical decision making, as well as reviewing the written report by the radiologist.  [place] Ob Comp Less 14 Wks  Result Date: 03/27/2018 CLINICAL DATA:  Right  lower quadrant pain for the past 2 days. Estimated gestational age of [redacted] weeks, 0 days by LMP. EXAM: OBSTETRIC <14 WK US AND TRANSVAGINAL OB US DOPPLER ULTRASOUND OF OVARIES TECHNIQUE: Both transabdominal and transvaginal ultrasound examinations were performed for complete evaluation of the gestation as well as the maternal uterus, adnexal regions, and pelvic cul-de-sac. Transvaginal technique was performed to assess early pregnancy. Color and duplex Doppler ultrasound was utilized to evaluate blood flow to the ovaries. COMPARISON:  CT abdomen pelvis dated October 23, 2016. FINDINGS: Intrauterine gestational sac: Single Yolk sac:  Visualized. Embryo:  Not Visualized. MSD: 9.9 mm   5 w   5  d Subchorionic hemorrhage:  None visualized. Maternal uterus/adnexae: Unremarkable.  Left corpus luteum. Pulsed Doppler evaluation of both ovaries demonstrates normal appearing low-resistance arterial and venous waveforms. IMPRESSION: 1. Early intrauterine gestational sac and yolk sac, but no fetal pole or cardiac activity yet visualized. Recommend follow-up quantitative B-HCG levels  and follow-up US in 7-10 days to assess viability. This recommendation follows SRU consensus guidelines: Diagnostic Criteria for Nonviable Pregnancy Early in the First Trimester. Malva Limes Med 2013; 161:0960-45. 2. No acute abnormality. No sonographic evidence of ovarian torsion. Electronically Signed   By: Obie Dredge M.D.   On: 03/27/2018 15:42   US Ob Transvaginal  Result Date: 03/27/2018 CLINICAL DATA:  Right lower quadrant pain for the past 2 days. Estimated gestational age of [redacted] weeks, 0 days by LMP. EXAM: OBSTETRIC <14 WK [place] AND TRANSVAGINAL OB US DOPPLER ULTRASOUND OF OVARIES TECHNIQUE: Both transabdominal and transvaginal ultrasound examinations were performed for complete evaluation of the gestation as well as the maternal uterus, adnexal regions, and pelvic cul-de-sac. Transvaginal technique was performed to assess early pregnancy. Color  and duplex Doppler ultrasound was utilized to evaluate blood flow to the ovaries. COMPARISON:  CT abdomen pelvis dated October 23, 2016. FINDINGS: Intrauterine gestational sac: Single Yolk sac:  Visualized. Embryo:  Not Visualized. MSD: 9.9 mm   5 w   5  d Subchorionic hemorrhage:  None visualized. Maternal uterus/adnexae: Unremarkable.  Left corpus luteum. Pulsed Doppler evaluation of both ovaries demonstrates normal appearing low-resistance arterial and venous waveforms. IMPRESSION: 1. Early intrauterine gestational sac and yolk sac, but no fetal pole or cardiac activity yet visualized. Recommend follow-up quantitative B-HCG levels and follow-up US in 7-10 days to assess viability. This recommendation follows SRU consensus guidelines: Diagnostic Criteria for Nonviable Pregnancy Early in the First Trimester. Malva Limes Med 2013; 409:8119-14. 2. No acute abnormality. No sonographic evidence of ovarian torsion. Electronically Signed   By: Obie Dredge M.D.   On: 03/27/2018 15:42   US Pelvic Doppler (torsion R/o Or Mass Arterial Flow)  Result Date: 03/27/2018 CLINICAL DATA:  Right lower quadrant pain for the past 2 days. Estimated gestational age of [redacted] weeks, 0 days by LMP. EXAM: OBSTETRIC <14 WK [place] AND TRANSVAGINAL OB US DOPPLER ULTRASOUND OF OVARIES TECHNIQUE: Both transabdominal and transvaginal ultrasound examinations were performed for complete evaluation of the gestation as well as the maternal uterus, adnexal regions, and pelvic cul-de-sac. Transvaginal technique was performed to assess early pregnancy. Color and duplex Doppler ultrasound was utilized to evaluate blood flow to the ovaries. COMPARISON:  CT abdomen pelvis dated October 23, 2016. FINDINGS: Intrauterine gestational sac: Single Yolk sac:  Visualized. Embryo:  Not Visualized. MSD: 9.9 mm   5 w   5  d Subchorionic hemorrhage:  None visualized. Maternal uterus/adnexae: Unremarkable.  Left corpus luteum. Pulsed Doppler evaluation of both ovaries  demonstrates normal appearing low-resistance arterial and venous waveforms. IMPRESSION: 1. Early intrauterine gestational sac and yolk sac, but no fetal pole or cardiac activity yet visualized. Recommend follow-up quantitative B-HCG levels and follow-up US in 7-10 days to assess viability. This recommendation follows SRU consensus guidelines: Diagnostic Criteria for Nonviable Pregnancy Early in the First Trimester. Malva Limes Med 2013; 782:9562-13. 2. No acute abnormality. No sonographic evidence of ovarian torsion. Electronically Signed   By: Obie Dredge M.D.   On: 03/27/2018 15:42    ____________________________________________    PROCEDURES  Procedure(s) performed:    Procedures    Medications  potassium chloride SA (K-DUR,KLOR-CON) CR tablet 40 mEq (40 mEq Oral Given 03/27/18 1639)     ____________________________________________   INITIAL IMPRESSION / ASSESSMENT AND PLAN / ED COURSE  Pertinent labs & imaging results that were available during my care of the patient were reviewed by me and considered in my medical decision making (see  chart for details).  Review of the [place] CSRS was performed in accordance of the [facility] prior to dispensing any controlled drugs.   Patient's diagnosis is consistent with early intrauterine pregnancy at 5 weeks. Care was taken over from Dr. Mayford Knife.  Vital signs and exam are reassuring.  Ultrasound consistent with intrauterine pregnancy of 5 weeks.  No torsion.  No infection on urinalysis.  White blood cell count slightly elevated, consistent with pregnancy.  Potassium is slightly low and was supplemented in ED.  Patient will be discharged home with prescriptions for prenatal vitamins and potassium.  She will return to the emergency department in 48 hours for repeat beta-hCG. Patient is given ED precautions to return to the ED for any worsening or new symptoms.     ____________________________________________  FINAL CLINICAL IMPRESSION(S) / ED  DIAGNOSES  Final diagnoses:  Pelvic pain in female  Less than [redacted] weeks gestation of pregnancy  Hypokalemia      NEW MEDICATIONS STARTED DURING THIS VISIT:  ED Discharge Orders         Ordered    potassium chloride (K-DUR) 10 MEQ tablet  2 times daily     03/27/18 1624    Prenatal Vit-Fe Fumarate-FA (PRENATAL VITAMINS) 28-0.8 MG TABS  Daily     03/27/18 1624              This chart was dictated using voice recognition software/Dragon. Despite best efforts to proofread, errors can occur which can change the meaning. Any change was purely unintentional.    Enid Derry, PA-C 03/27/18 2329    Rockne Menghini, MD 03/27/18 [phone]

## 2018-04-09 ENCOUNTER — Ambulatory Visit (INDEPENDENT_AMBULATORY_CARE_PROVIDER_SITE_OTHER): Payer: Medicaid Other | Admitting: Obstetrics and Gynecology

## 2018-04-09 ENCOUNTER — Encounter: Payer: Self-pay | Admitting: Obstetrics and Gynecology

## 2018-04-09 VITALS — BP 139/82 | HR 80 | Ht 62.0 in | Wt 131.6 lb

## 2018-04-09 DIAGNOSIS — O3680X0 Pregnancy with inconclusive fetal viability, not applicable or unspecified: Secondary | ICD-10-CM | POA: Diagnosis not present

## 2018-04-09 DIAGNOSIS — N926 Irregular menstruation, unspecified: Secondary | ICD-10-CM

## 2018-04-09 DIAGNOSIS — Z8742 Personal history of other diseases of the female genital tract: Secondary | ICD-10-CM

## 2018-04-09 DIAGNOSIS — Z3201 Encounter for pregnancy test, result positive: Secondary | ICD-10-CM | POA: Diagnosis not present

## 2018-04-09 DIAGNOSIS — E876 Hypokalemia: Secondary | ICD-10-CM | POA: Diagnosis not present

## 2018-04-09 LAB — POCT URINE PREGNANCY: Preg Test, Ur: POSITIVE — AB

## 2018-04-09 NOTE — Progress Notes (Signed)
PT is present today for confirmation of pregnancy. Pt LMP 02/20/18. UPT done today results were positive. Pt stated that she is doing well no complaints.

## 2018-04-09 NOTE — Patient Instructions (Signed)
First Trimester of Pregnancy The first trimester of pregnancy is from week 1 until the end of week 13 (months 1 through 3). During this time, your baby will begin to develop inside you. At 6-8 weeks, the eyes and face are formed, and the heartbeat can be seen on ultrasound. At the end of 12 weeks, all the baby's organs are formed. Prenatal care is all the medical care you receive before the birth of your baby. Make sure you get good prenatal care and follow all of your doctor's instructions. Follow these instructions at home: Medicines  Take over-the-counter and prescription medicines only as told by your doctor. Some medicines are safe and some medicines are not safe during pregnancy.  Take a prenatal vitamin that contains at least 600 micrograms (mcg) of folic acid.  If you have trouble pooping (constipation), take medicine that will make your stool soft (stool softener) if your doctor approves. Eating and drinking  Eat regular, healthy meals.  Your doctor will tell you the amount of weight gain that is right for you.  Avoid raw meat and uncooked cheese.  If you feel sick to your stomach (nauseous) or throw up (vomit): ? Eat 4 or 5 small meals a day instead of 3 large meals. ? Try eating a few soda crackers. ? Drink liquids between meals instead of during meals.  To prevent constipation: ? Eat foods that are high in fiber, like fresh fruits and vegetables, whole grains, and beans. ? Drink enough fluids to keep your pee (urine) clear or pale yellow. Activity  Exercise only as told by your doctor. Stop exercising if you have cramps or pain in your lower belly (abdomen) or low back.  Do not exercise if it is too hot, too humid, or if you are in a place of great height (high altitude).  Try to avoid standing for long periods of time. Move your legs often if you must stand in one place for a long time.  Avoid heavy lifting.  Wear low-heeled shoes. Sit and stand up straight.  You  can have sex unless your doctor tells you not to. Relieving pain and discomfort  Wear a good support bra if your breasts are sore.  Take warm water baths (sitz baths) to soothe pain or discomfort caused by hemorrhoids. Use hemorrhoid cream if your doctor says it is okay.  Rest with your legs raised if you have leg cramps or low back pain.  If you have puffy, bulging veins (varicose veins) in your legs: ? Wear support hose or compression stockings as told by your doctor. ? Raise (elevate) your feet for 15 minutes, 3-4 times a day. ? Limit salt in your food. Prenatal care  Schedule your prenatal visits by the twelfth week of pregnancy.  Write down your questions. Take them to your prenatal visits.  Keep all your prenatal visits as told by your doctor. This is important. Safety  Wear your seat belt at all times when driving.  Make a list of emergency phone numbers. The list should include numbers for family, friends, the hospital, and police and fire departments. General instructions  Ask your doctor for a referral to a local prenatal class. Begin classes no later than at the start of month 6 of your pregnancy.  Ask for help if you need counseling or if you need help with nutrition. Your doctor can give you advice or tell you where to go for help.  Do not use hot tubs, steam rooms, or   saunas.  Do not douche or use tampons or scented sanitary pads.  Do not cross your legs for long periods of time.  Avoid all herbs and alcohol. Avoid drugs that are not approved by your doctor.  Do not use any tobacco products, including cigarettes, chewing tobacco, and electronic cigarettes. If you need help quitting, ask your doctor. You may get counseling or other support to help you quit.  Avoid cat litter boxes and soil used by cats. These carry germs that can cause birth defects in the baby and can cause a loss of your baby (miscarriage) or stillbirth.  Visit your dentist. At home, brush  your teeth with a soft toothbrush. Be gentle when you floss. Contact a doctor if:  You are dizzy.  You have mild cramps or pressure in your lower belly.  You have a nagging pain in your belly area.  You continue to feel sick to your stomach, you throw up, or you have watery poop (diarrhea).  You have a bad smelling fluid coming from your vagina.  You have pain when you pee (urinate).  You have increased puffiness (swelling) in your face, hands, legs, or ankles. Get help right away if:  You have a fever.  You are leaking fluid from your vagina.  You have spotting or bleeding from your vagina.  You have very bad belly cramping or pain.  You gain or lose weight rapidly.  You throw up blood. It may look like coffee grounds.  You are around people who have German measles, fifth disease, or chickenpox.  You have a very bad headache.  You have shortness of breath.  You have any kind of trauma, such as from a fall or a car accident. Summary  The first trimester of pregnancy is from week 1 until the end of week 13 (months 1 through 3).  To take care of yourself and your unborn baby, you will need to eat healthy meals, take medicines only if your doctor tells you to do so, and do activities that are safe for you and your baby.  Keep all follow-up visits as told by your doctor. This is important as your doctor will have to ensure that your baby is healthy and growing well. This information is not intended to replace advice given to you by your health care provider. Make sure you discuss any questions you have with your health care provider. Document Released: 01/08/2008 Document Revised: 07/30/2016 Document Reviewed: 07/30/2016 Elsevier Interactive Patient Education  2017 [employer]  

## 2018-04-09 NOTE — Progress Notes (Signed)
GYNECOLOGY CLINIC PROGRESS NOTE Subjective:    Marcia Hodges is a 22 y.o. G66P1001 female who presents for evaluation of amenorrhea. She believes she could be pregnant. Pregnancy is desired. Sexual Activity: single partner, contraception: none. Current symptoms also include: breast tenderness, fatigue, nausea and positive home pregnancy test. Last period was normal. Patient's last menstrual period was 02/20/2018.  She does note a history of irregular periods.   Of note, patient was seen in the ER almost 2 weeks ago for complaints of abdominal pain in early pregnancy (mostly right-sided).  Notes she was diagnosed with low potassium. Was given treatment with PO potassium tablets.  Also reports that her ultrasound showed an intrauterine gestational sac but no fetus, however was only ~ [redacted] weeks pregnant at that time and was told it may be too early to see a fetus.   The following portions of the patient's history were reviewed and updated as appropriate: allergies, current medications, past family history, past medical history, past social history, past surgical history and problem list.  Review of Systems Pertinent items noted in HPI and remainder of comprehensive ROS otherwise negative.     Objective:    BP 139/82   Pulse 80   Ht _0  (1.575 m)   Wt 131 lb 9.6 oz (59.7 kg)   LMP 02/20/2018   BMI 24.07 kg/m  General: alert, no distress and no acute distress    Lab Review Urine HCG: positive     Admission on 03/27/2018, Discharged on 03/27/2018  Component Date Value Ref Range Status  . hCG, Beta Chain, Quant, S 03/27/2018 16,999* <5 mIU/mL Final   Comment:          GEST. AGE      CONC.  (mIU/mL)   <=1 WEEK        5 - 50     2 WEEKS       50 - 500     3 WEEKS       100 - 10,000     4 WEEKS     1,000 - 30,000     5 WEEKS     3,500 - 115,000   6-8 WEEKS     12,000 - 270,000    12 WEEKS     15,000 - 220,000        FEMALE AND NON-PREGNANT FEMALE:     LESS THAN 5  mIU/mL Performed at [facility], [address] [place], [place] [address]   . Color, Urine 03/27/2018 STRAW* YELLOW Final  . APPearance 03/27/2018 CLEAR* CLEAR Final  . Specific Gravity, Urine 03/27/2018 1.008  1.005 - 1.030 Final  . pH 03/27/2018 7.0  5.0 - 8.0 Final  . Glucose, UA 03/27/2018 NEGATIVE  NEGATIVE mg/dL Final  . Hgb urine dipstick 03/27/2018 NEGATIVE  NEGATIVE Final  . Bilirubin Urine 03/27/2018 NEGATIVE  NEGATIVE Final  . Ketones, ur 03/27/2018 NEGATIVE  NEGATIVE mg/dL Final  . Protein, ur 03/27/2018 NEGATIVE  NEGATIVE mg/dL Final  . Nitrite 03/27/2018 NEGATIVE  NEGATIVE Final  . Leukocytes, UA 03/27/2018 NEGATIVE  NEGATIVE Final  . RBC / HPF 03/27/2018 0-5  0 - 5 RBC/hpf Final  . WBC, UA 03/27/2018 0-5  0 - 5 WBC/hpf Final  . Bacteria, UA 03/27/2018 NONE SEEN  NONE SEEN Final  . Squamous Epithelial / LPF 03/27/2018 0-5  0 - 5 Final   Performed at [facility], [address] [place], [place] [address]  . WBC 03/27/2018 11.2* 3.6 -  11.0 K/uL Final  . RBC 03/27/2018 4.43  3.80 - 5.20 MIL/uL Final  . Hemoglobin 03/27/2018 12.7  12.0 - 16.0 g/dL Final  . HCT 03/27/2018 37.2  35.0 - 47.0 % Final  . MCV 03/27/2018 83.9  80.0 - 100.0 fL Final  . MCH 03/27/2018 28.7  26.0 - 34.0 pg Final  . MCHC 03/27/2018 34.3  32.0 - 36.0 g/dL Final  . RDW 03/27/2018 13.9  11.5 - 14.5 % Final  . Platelets 03/27/2018 314  150 - 440 K/uL Final   Performed at [facility], [address] [place], [place] [address]  . Sodium 03/27/2018 137  135 - 145 mmol/L Final  . Potassium 03/27/2018 3.2* 3.5 - 5.1 mmol/L Final  . Chloride 03/27/2018 103  98 - 111 mmol/L Final  . CO2 03/27/2018 27  22 - 32 mmol/L Final  . Glucose, Bld 03/27/2018 108* 70 - 99 mg/dL Final  . BUN 03/27/2018 8  6 - 20 mg/dL Final  . Creatinine, Ser 03/27/2018 0.60  0.44 - 1.00 mg/dL Final  . Calcium 03/27/2018 9.4  8.9 - 10.3 mg/dL Final  . Total Protein 03/27/2018 8.1  6.5 - 8.1 g/dL  Final  . Albumin 03/27/2018 4.1  3.5 - 5.0 g/dL Final  . AST 03/27/2018 19  15 - 41 U/L Final  . ALT 03/27/2018 21  0 - 44 U/L Final  . Alkaline Phosphatase 03/27/2018 66  38 - 126 U/L Final  . Total Bilirubin 03/27/2018 0.4  0.3 - 1.2 mg/dL Final  . GFR calc non Af Amer 03/27/2018 >60  >60 mL/min Final  . GFR calc Af Amer 03/27/2018 >60  >60 mL/min Final   Comment: (NOTE) The eGFR has been calculated using the CKD EPI equation. This calculation has not been validated in all clinical situations. eGFR's persistently <60 mL/min signify possible Chronic Kidney Disease.   Georgiann Hahn gap 03/27/2018 7  5 - 15 Final   Performed at [facility], [address] [place],  [address]  . Preg Test, Ur 03/27/2018 POSITIVE* NEGATIVE Final   Comment:        THE SENSITIVITY OF THIS METHODOLOGY IS >24 mIU/mL      US OB Transvaginal CLINICAL DATA:  Right lower quadrant pain for the past 2 days. Estimated gestational age of [redacted] weeks, 0 days by LMP.  EXAM: OBSTETRIC <14 WK [place] AND TRANSVAGINAL OB  US DOPPLER ULTRASOUND OF OVARIES  TECHNIQUE: Both transabdominal and transvaginal ultrasound examinations were performed for complete evaluation of the gestation as well as the maternal uterus, adnexal regions, and pelvic cul-de-sac. Transvaginal technique was performed to assess early pregnancy.  Color and duplex Doppler ultrasound was utilized to evaluate blood flow to the ovaries.  COMPARISON:  CT abdomen pelvis dated October 23, 2016.  FINDINGS: Intrauterine gestational sac: Single  Yolk sac:  Visualized.  Embryo:  Not Visualized.  MSD: 9.9 mm   5 w   5  d  Subchorionic hemorrhage:  None visualized.  Maternal uterus/adnexae: Unremarkable.  Left corpus luteum.  Pulsed Doppler evaluation of both ovaries demonstrates normal appearing low-resistance arterial and venous waveforms.  IMPRESSION: 1. Early intrauterine gestational sac and yolk sac, but no fetal pole or cardiac  activity yet visualized. Recommend follow-up quantitative B-HCG levels and follow-up US in 7-10 days to assess viability. This recommendation follows SRU consensus guidelines: Diagnostic Criteria for Nonviable Pregnancy Early in the First Trimester. [place] Med 2013; 841:6606-30. 2. No acute abnormality. No sonographic evidence  of ovarian torsion.  Electronically Signed   By: Titus Dubin M.D.   On: 03/27/2018 15:42 US OB Comp Less 14 Wks CLINICAL DATA:  Right lower quadrant pain for the past 2 days. Estimated gestational age of [redacted] weeks, 0 days by LMP.  EXAM: OBSTETRIC <14 WK [place] AND TRANSVAGINAL OB  US DOPPLER ULTRASOUND OF OVARIES  TECHNIQUE: Both transabdominal and transvaginal ultrasound examinations were performed for complete evaluation of the gestation as well as the maternal uterus, adnexal regions, and pelvic cul-de-sac. Transvaginal technique was performed to assess early pregnancy.  Color and duplex Doppler ultrasound was utilized to evaluate blood flow to the ovaries.  COMPARISON:  CT abdomen pelvis dated October 23, 2016.  FINDINGS: Intrauterine gestational sac: Single  Yolk sac:  Visualized.  Embryo:  Not Visualized.  MSD: 9.9 mm   5 w   5  d  Subchorionic hemorrhage:  None visualized.  Maternal uterus/adnexae: Unremarkable.  Left corpus luteum.  Pulsed Doppler evaluation of both ovaries demonstrates normal appearing low-resistance arterial and venous waveforms.  IMPRESSION: 1. Early intrauterine gestational sac and yolk sac, but no fetal pole or cardiac activity yet visualized. Recommend follow-up quantitative B-HCG levels and follow-up US in 7-10 days to assess viability. This recommendation follows SRU consensus guidelines: Diagnostic Criteria for Nonviable Pregnancy Early in the First Trimester. [employer] Med 2013; 161:0960-45. 2. No acute abnormality. No sonographic evidence of ovarian torsion.  Electronically Signed   By: Titus Dubin  M.D.   On: 03/27/2018 15:42 US PELVIC DOPPLER (TORSION R/O OR MASS ARTERIAL FLOW) CLINICAL DATA:  Right lower quadrant pain for the past 2 days. Estimated gestational age of [redacted] weeks, 0 days by LMP.  EXAM: OBSTETRIC <14 WK [place] AND TRANSVAGINAL OB  US DOPPLER ULTRASOUND OF OVARIES  TECHNIQUE: Both transabdominal and transvaginal ultrasound examinations were performed for complete evaluation of the gestation as well as the maternal uterus, adnexal regions, and pelvic cul-de-sac. Transvaginal technique was performed to assess early pregnancy.  Color and duplex Doppler ultrasound was utilized to evaluate blood flow to the ovaries.  COMPARISON:  CT abdomen pelvis dated October 23, 2016.  FINDINGS: Intrauterine gestational sac: Single  Yolk sac:  Visualized.  Embryo:  Not Visualized.  MSD: 9.9 mm   5 w   5  d  Subchorionic hemorrhage:  None visualized.  Maternal uterus/adnexae: Unremarkable.  Left corpus luteum.  Pulsed Doppler evaluation of both ovaries demonstrates normal appearing low-resistance arterial and venous waveforms.  IMPRESSION: 1. Early intrauterine gestational sac and yolk sac, but no fetal pole or cardiac activity yet visualized. Recommend follow-up quantitative B-HCG levels and follow-up US in 7-10 days to assess viability. This recommendation follows SRU consensus guidelines: Diagnostic Criteria for Nonviable Pregnancy Early in the First Trimester. [employer] Med 2013; 409:8119-14. 2. No acute abnormality. No sonographic evidence of ovarian torsion.  Electronically Signed   By: Titus Dubin M.D.   On: 03/27/2018 15:42   Assessment:           Plan:   - Pregnancy Test: Positive: EDC: 11/26/2017. Briefly discussed pre-natal care options. Pregnancy, Childbirth and the Newborn book given. Encouraged well-balanced diet, plenty of rest when needed, pre-natal vitamins daily and walking for exercise. Discussed self-help for nausea, avoiding OTC medications  until consulting provider or pharmacist, other than Tylenol as needed, minimal caffeine (1-2 cups daily) and avoiding alcohol. She will schedule her initial OB visit in the next month and an ultrasound and NOB intake in 2-3 weeks. Feel  free to call with any questions. - Pregnancy with uncertain fetal viability, single or unspecified fetus - Plan: HCG, Tumor Marker, US OB Comp Less 14 Wks - Hypokalemia - Plan: Basic metabolic panel.  Has completed oral repletion therapy.  - History of irregular menstrual bleeding - Plan:  ultrasound and HCG for dating/viability   Rubie Maid, MD [facility]

## 2018-04-10 LAB — BASIC METABOLIC PANEL
BUN / CREAT RATIO: 15 (ref 9–23)
BUN: 8 mg/dL (ref 6–20)
CHLORIDE: 100 mmol/L (ref 96–106)
CO2: 22 mmol/L (ref 20–29)
Calcium: 9.6 mg/dL (ref 8.7–10.2)
Creatinine, Ser: 0.54 mg/dL — ABNORMAL LOW (ref 0.57–1.00)
GFR calc Af Amer: 154 mL/min/{1.73_m2} (ref >59)
GFR calc non Af Amer: 133 mL/min/{1.73_m2} (ref >59)
GLUCOSE: 71 mg/dL (ref 65–99)
POTASSIUM: 3.5 mmol/L (ref 3.5–5.2)
SODIUM: 137 mmol/L (ref 134–144)

## 2018-04-10 LAB — BETA HCG QUANT (REF LAB): HCG QUANT: 93352 m[IU]/mL

## 2018-04-23 ENCOUNTER — Ambulatory Visit (INDEPENDENT_AMBULATORY_CARE_PROVIDER_SITE_OTHER): Payer: Medicaid Other | Admitting: Obstetrics and Gynecology

## 2018-04-23 ENCOUNTER — Encounter: Payer: Self-pay | Admitting: Obstetrics and Gynecology

## 2018-04-23 ENCOUNTER — Other Ambulatory Visit (INDEPENDENT_AMBULATORY_CARE_PROVIDER_SITE_OTHER): Payer: Medicaid Other

## 2018-04-23 VITALS — BP 108/72 | HR 89 | Wt 130.6 lb

## 2018-04-23 DIAGNOSIS — N926 Irregular menstruation, unspecified: Secondary | ICD-10-CM

## 2018-04-23 DIAGNOSIS — Z3A09 9 weeks gestation of pregnancy: Secondary | ICD-10-CM | POA: Diagnosis not present

## 2018-04-23 DIAGNOSIS — Z3481 Encounter for supervision of other normal pregnancy, first trimester: Secondary | ICD-10-CM

## 2018-04-23 DIAGNOSIS — O3680X0 Pregnancy with inconclusive fetal viability, not applicable or unspecified: Secondary | ICD-10-CM

## 2018-04-23 LAB — OB RESULTS CONSOLE VARICELLA ZOSTER ANTIBODY, IGG: Varicella: IMMUNE

## 2018-04-23 NOTE — Progress Notes (Signed)
[facility] for NOB nurse interview visit. Pregnancy confirmation done _9/5/19 [facility]_____.  G-2 .  P-1    . Pregnancy education material explained and given. _0__ cats in the home. NOB labs ordered.  HIV labs and Drug screen were explained optional and she did not decline. Drug screen ordered. PNV encouraged. Genetic screening options discussed. Genetic testing: Ordered.  Pt may discuss with provider. Pt. To follow up with provider in _2_ weeks for NOB physical.  All questions answered.Dating and viability U/S done.

## 2018-04-24 LAB — RUBELLA SCREEN: RUBELLA: 4.54 {index} (ref >0.99)

## 2018-04-24 LAB — MICROSCOPIC EXAMINATION: Casts: NONE SEEN /lpf

## 2018-04-24 LAB — CBC WITH DIFFERENTIAL
BASOS: 0 %
Basophils Absolute: 0 10*3/uL (ref 0.0–0.2)
EOS (ABSOLUTE): 0.1 10*3/uL (ref 0.0–0.4)
Eos: 1 %
Hematocrit: 37.8 % (ref 34.0–46.6)
Hemoglobin: 12.7 g/dL (ref 11.1–15.9)
Immature Grans (Abs): 0 10*3/uL (ref 0.0–0.1)
Immature Granulocytes: 0 %
LYMPHS: 19 %
Lymphocytes Absolute: 2.2 10*3/uL (ref 0.7–3.1)
MCH: 28.4 pg (ref 26.6–33.0)
MCHC: 33.6 g/dL (ref 31.5–35.7)
MCV: 85 fL (ref 79–97)
Monocytes Absolute: 0.9 10*3/uL (ref 0.1–0.9)
Monocytes: 8 %
NEUTROS PCT: 72 %
Neutrophils Absolute: 8.4 10*3/uL — ABNORMAL HIGH (ref 1.4–7.0)
RBC: 4.47 x10E6/uL (ref 3.77–5.28)
RDW: 13.4 % (ref 12.3–15.4)
WBC: 11.6 10*3/uL — ABNORMAL HIGH (ref 3.4–10.8)

## 2018-04-24 LAB — URINALYSIS, ROUTINE W REFLEX MICROSCOPIC
BILIRUBIN UA: NEGATIVE
GLUCOSE, UA: NEGATIVE
Ketones, UA: NEGATIVE
NITRITE UA: NEGATIVE
PH UA: 6.5 (ref 5.0–7.5)
PROTEIN UA: NEGATIVE
RBC UA: NEGATIVE
Specific Gravity, UA: 1.014 (ref 1.005–1.030)
UUROB: 1 mg/dL (ref 0.2–1.0)

## 2018-04-24 LAB — RPR: RPR Ser Ql: NONREACTIVE

## 2018-04-24 LAB — ANTIBODY SCREEN: Antibody Screen: NEGATIVE

## 2018-04-24 LAB — HEPATITIS B SURFACE ANTIGEN: HEP B S AG: NEGATIVE

## 2018-04-24 LAB — VARICELLA ZOSTER ANTIBODY, IGG: VARICELLA: 523 {index} (ref >165)

## 2018-04-24 LAB — ABO AND RH: Rh Factor: POSITIVE

## 2018-04-24 LAB — HIV ANTIBODY (ROUTINE TESTING W REFLEX): HIV Screen 4th Generation wRfx: NONREACTIVE

## 2018-04-24 MED ORDER — DICLEGIS 10-10 MG PO TBEC: 2.0000 | DELAYED_RELEASE_TABLET | Freq: Every day | ORAL | 5 refills | Status: DC

## 2018-04-25 LAB — MONITOR DRUG PROFILE 14(MW)
Amphetamine Scrn, Ur: NEGATIVE ng/mL
BARBITURATE SCREEN URINE: NEGATIVE ng/mL
BENZODIAZEPINE SCREEN, URINE: NEGATIVE ng/mL
BUPRENORPHINE, URINE: NEGATIVE ng/mL
CANNABINOIDS UR QL SCN: NEGATIVE ng/mL
COCAINE(METAB.)SCREEN, URINE: NEGATIVE ng/mL
CREATININE(CRT), U: 79.1 mg/dL (ref 20.0–300.0)
Fentanyl, Urine: NEGATIVE pg/mL
MEPERIDINE SCREEN, URINE: NEGATIVE ng/mL
METHADONE SCREEN, URINE: NEGATIVE ng/mL
OPIATE SCREEN URINE: NEGATIVE ng/mL
OXYCODONE+OXYMORPHONE UR QL SCN: NEGATIVE ng/mL
PHENCYCLIDINE QUANTITATIVE URINE: NEGATIVE ng/mL
PROPOXYPHENE SCREEN URINE: NEGATIVE ng/mL
Ph of Urine: 6.8 (ref 4.5–8.9)
SPECIFIC GRAVITY: 1.014
TRAMADOL SCREEN, URINE: NEGATIVE ng/mL

## 2018-04-25 LAB — GC/CHLAMYDIA PROBE AMP
CHLAMYDIA, DNA PROBE: NEGATIVE
Neisseria gonorrhoeae by PCR: NEGATIVE

## 2018-04-25 LAB — NICOTINE SCREEN, URINE: Cotinine Ql Scrn, Ur: NEGATIVE ng/mL

## 2018-04-25 LAB — URINE CULTURE

## 2018-04-25 NOTE — Progress Notes (Signed)
I have reviewed the record and concur with patient management and plan.  Yoshua Geisinger, MD [facility]     

## 2018-05-11 ENCOUNTER — Ambulatory Visit (INDEPENDENT_AMBULATORY_CARE_PROVIDER_SITE_OTHER): Payer: Medicaid Other | Admitting: Obstetrics and Gynecology

## 2018-05-11 ENCOUNTER — Encounter: Payer: Self-pay | Admitting: Obstetrics and Gynecology

## 2018-05-11 VITALS — BP 99/67 | HR 76 | Ht 62.0 in | Wt 130.2 lb

## 2018-05-11 DIAGNOSIS — Z3A12 12 weeks gestation of pregnancy: Secondary | ICD-10-CM

## 2018-05-11 DIAGNOSIS — O26891 Other specified pregnancy related conditions, first trimester: Secondary | ICD-10-CM | POA: Diagnosis not present

## 2018-05-11 DIAGNOSIS — R110 Nausea: Secondary | ICD-10-CM

## 2018-05-11 DIAGNOSIS — Z8669 Personal history of other diseases of the nervous system and sense organs: Secondary | ICD-10-CM

## 2018-05-11 DIAGNOSIS — Z3481 Encounter for supervision of other normal pregnancy, first trimester: Secondary | ICD-10-CM

## 2018-05-11 DIAGNOSIS — R102 Pelvic and perineal pain: Secondary | ICD-10-CM | POA: Diagnosis not present

## 2018-05-11 DIAGNOSIS — O26899 Other specified pregnancy related conditions, unspecified trimester: Secondary | ICD-10-CM

## 2018-05-11 LAB — POCT URINALYSIS DIPSTICK OB
BILIRUBIN UA: NEGATIVE
Blood, UA: NEGATIVE
GLUCOSE, UA: NEGATIVE
Ketones, UA: NEGATIVE
LEUKOCYTES UA: NEGATIVE
NITRITE UA: NEGATIVE
PH UA: 7 (ref 5.0–8.0)
POC,PROTEIN,UA: NEGATIVE
Spec Grav, UA: <=1.005 — AB (ref 1.010–1.025)
UROBILINOGEN UA: 0.2 U/dL

## 2018-05-11 MED ORDER — DICLEGIS 10-10 MG PO TBEC: 2.0000 | DELAYED_RELEASE_TABLET | Freq: Every day | ORAL | 2 refills | Status: DC

## 2018-05-11 NOTE — Patient Instructions (Signed)

## 2018-05-11 NOTE — Progress Notes (Signed)
OBSTETRIC INITIAL PRENATAL VISIT  Subjective:    [facility] is being seen today for her first obstetrical visit.  This is a planned pregnancy. She is a G2P1001 female at [redacted]w[redacted]d gestation, Estimated Date of Delivery: 11/22/18 with Patient's last menstrual period was 02/20/2018 exact date, consistent with 9 week sono. Her obstetrical history is significant for migraines without aura. Relationship with FOB: spouse, living together. Patient does intend to breast feed. Pregnancy history fully reviewed.    OB History  Gravida Para Term Preterm AB Living  2 1 1  0 0 1  SAB TAB Ectopic Multiple Live Births  0 0 0 0 0    # Outcome Date GA Lbr Len/2nd Weight Sex Delivery Anes PTL Lv  2 Current           1 Term 04/25/16 [redacted]w[redacted]d  7 lb 1.2 oz (3.21 kg) M Vag-Spont EPI       Name: ALELI, NAVEDO     Apgar1: 7  Apgar5: 9    Gynecologic History:  Last pap smear was 04/2016.  Results were normal.  Denies h/o abnormal pap smears in the past.  Denies history of STIs.  Contraception: None   Past Medical History:  Diagnosis Date  . 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 Headache    migraine- w/o aura     Family History  Problem Relation Age of Onset  . Hypertension Father   . Diabetes Neg Hx   . Cancer Neg Hx   . Hearing loss Neg Hx      Past Surgical History:  Procedure Laterality Date  . wisdom teeth pulled       Social History   Socioeconomic History  . Marital status: Single    Spouse name: Not on file  . Number of children: Not on file  . Years of education: Not on file  . Highest education level: Not on file  Occupational History  . Not on file  Social Needs  . Financial resource strain: Not on file  . Food insecurity:    Worry: Not on file    Inability: Not on file  . Transportation needs:    Medical: Not on file    Non-medical: Not on file  Tobacco Use  . Smoking status: Never Smoker  .  Smokeless tobacco: Never Used  Substance and Sexual Activity  . Alcohol use: No    Alcohol/week: 0.0 standard drinks  . Drug use: No  . Sexual activity: Yes    Partners: Male    Birth control/protection: None  Lifestyle  . Physical activity:    Days per week: Not on file    Minutes per session: Not on file  . Stress: Not on file  Relationships  . Social connections:    Talks on phone: Not on file    Gets together: Not on file    Attends religious service: Not on file    Active member of club or organization: Not on file    Attends meetings of clubs or organizations: Not on file    Relationship status: Not on file  . Intimate partner violence:    Fear of current or ex partner: Not on file    Emotionally abused: Not on file    Physically abused: Not on file    Forced sexual activity: Not on file  Other Topics Concern  . Not on file  Social History Narrative  .  Not on file     Current Outpatient Medications on File Prior to Visit  Medication Sig Dispense Refill  . DICLEGIS 10-10 MG TBEC Take 2 tablets by mouth at bedtime. If symptoms persist, add one tablet in the morning and one in the afternoon 100 tablet 5  . Prenatal Vit-Fe Fumarate-FA (PRENATAL MULTIVITAMIN) TABS tablet Take 1 tablet by mouth daily at 12 noon.     No current facility-administered medications on file prior to visit.      No Known Allergies    Review of Systems General:Not Present- Fever, Weight Loss and Weight Gain. Skin:Not Present- Rash. HEENT:Not Present- Blurred Vision, Headache and Bleeding Gums. Respiratory:Not Present- Difficulty Breathing. Breast:Not Present- Breast Mass. Cardiovascular:Not Present- Chest Pain, Elevated Blood Pressure, Fainting / Blacking Out and Shortness of Breath. Gastrointestinal:Not Present- Abdominal Pain, Constipation, and Vomiting. Present - Nausea Female Genitourinary:Not Present- Frequency, Painful Urination, Pelvic Pain, Vaginal Bleeding, Vaginal  Discharge, Contractions, regular, Fetal Movements Decreased, Urinary Complaints and Vaginal Fluid. Present- pelvic cramping. Musculoskeletal:Not Present- Back Pain and Leg Cramps. Neurological:Not Present- Dizziness. Psychiatric:Not Present- Depression.     Objective:   Blood pressure 99/67, pulse 76, weight 130 lb 3.2 oz (59.1 kg), last menstrual period 02/20/2018.  Body mass index is 23.81 kg/m.  General Appearance:    Alert, cooperative, no distress, appears stated age  Head:    Normocephalic, without obvious abnormality, atraumatic  Eyes:    PERRL, conjunctiva/corneas clear, EOM's intact, both eyes  Ears:    Normal external ear canals, both ears  Nose:   Nares normal, septum midline, mucosa normal, no drainage or sinus tenderness  Throat:   Lips, mucosa, and tongue normal; teeth and gums normal  Neck:   Supple, symmetrical, trachea midline, no adenopathy; thyroid: no enlargement/tenderness/nodules; no carotid bruit or JVD  Back:     Symmetric, no curvature, ROM normal, no CVA tenderness  Lungs:     Clear to auscultation bilaterally, respirations unlabored  Chest Wall:    No tenderness or deformity   Heart:    Regular rate and rhythm, S1 and S2 normal, no murmur, rub or gallop  Breast Exam:    No tenderness, masses, or nipple abnormality  Abdomen:     Soft, non-tender, bowel sounds active all four quadrants, no masses, no organomegaly.  FH 12.  FHT 157  bpm.  Genitalia:    Pelvic:external genitalia normal, vagina without lesions, discharge, or tenderness, rectovaginal septum  normal. Cervix normal in appearance, no cervical motion tenderness, no adnexal masses or tenderness.  Pregnancy positive findings: uterine enlargement: 12 wk size, nontender.   Rectal:    Normal external sphincter.  No hemorrhoids appreciated. Internal exam not done.   Extremities:   Extremities normal, atraumatic, no cyanosis or edema  Pulses:   2+ and symmetric all extremities  Skin:   Skin color, texture,  turgor normal, no rashes or lesions  Lymph nodes:   Cervical, supraclavicular, and axillary nodes normal  Neurologic:   CNII-XII intact, normal strength, sensation and reflexes throughout     Assessment:   Pregnancy at 12 and 1/7 weeks  History of Migraines  Nausea Pelvic cramping  Plan:    Initial labs reviewed.  Prenatal vitamins encouraged. Problem list reviewed and updated. Nausea - has Diclegis prescription but has not yet picked it up from the pharmacy. Desires to change pharmacy to [facility] closer to this office. Will resubmit.  History of migraines, none recent. Will monitor during pregnancy Pelvic cramping - given reassurance, not currently bleeding.  FHT appreciated today. Can use Tylenol, warm compresses prn.  New OB counseling:  The patient has been given an overview regarding routine prenatal care.  Recommendations regarding diet, weight gain, and exercise in pregnancy were given. Prenatal testing, optional genetic testing, and ultrasound use in pregnancy were reviewed. Panorama testing done: results reviewed. Panorama with normal female fetus. Will need AFP next visit for spina bifida screening.  Benefits of Breast Feeding were discussed. The patient is encouraged to consider nursing her baby post partum. Follow up in 4 weeks.  50% of 30 min visit spent on counseling and coordination of care.    The patient has Medicaid.  [facility] Medicaid Risk Screening Form completed today   Hildred Laser, MD [facility]

## 2018-05-11 NOTE — Progress Notes (Signed)
Pt is present today for her NOB-PE pt stated that she get some light cramping but nothing major.

## 2018-05-28 ENCOUNTER — Other Ambulatory Visit (INDEPENDENT_AMBULATORY_CARE_PROVIDER_SITE_OTHER): Payer: Medicaid Other

## 2018-05-28 ENCOUNTER — Other Ambulatory Visit: Payer: Self-pay | Admitting: Obstetrics and Gynecology

## 2018-05-28 ENCOUNTER — Ambulatory Visit: Payer: Medicaid Other | Admitting: Obstetrics and Gynecology

## 2018-05-28 ENCOUNTER — Encounter: Payer: Self-pay | Admitting: Obstetrics and Gynecology

## 2018-05-28 VITALS — BP 115/76 | HR 87

## 2018-05-28 DIAGNOSIS — O4692 Antepartum hemorrhage, unspecified, second trimester: Secondary | ICD-10-CM

## 2018-05-28 DIAGNOSIS — O26852 Spotting complicating pregnancy, second trimester: Secondary | ICD-10-CM | POA: Diagnosis not present

## 2018-05-28 DIAGNOSIS — O26859 Spotting complicating pregnancy, unspecified trimester: Secondary | ICD-10-CM

## 2018-05-28 DIAGNOSIS — Z3A14 14 weeks gestation of pregnancy: Secondary | ICD-10-CM

## 2018-05-28 LAB — POCT URINALYSIS DIPSTICK OB
BILIRUBIN UA: NEGATIVE
GLUCOSE, UA: NEGATIVE
Ketones, UA: NEGATIVE
Leukocytes, UA: NEGATIVE
Nitrite, UA: NEGATIVE
POC,PROTEIN,UA: NEGATIVE
RBC UA: NEGATIVE
Spec Grav, UA: 1.015 (ref 1.010–1.025)
Urobilinogen, UA: 0.2 E.U./dL
pH, UA: 6.5 (ref 5.0–8.0)

## 2018-05-28 NOTE — Progress Notes (Signed)
Patient states that she began having spotting on Tuesday and has continued until today.  She reports occasional pelvic "twinges"-not sure if the contractions.  Patient has not had intercourse in 2 weeks. Sent for ultrasound for fetal viability and investigation of possible subchorionic hemorrhage.

## 2018-05-28 NOTE — Addendum Note (Signed)
Addended by: Haskel Khan on: 05/28/2018 09:24 AM   Modules accepted: Orders

## 2018-05-28 NOTE — Progress Notes (Signed)
Pt presents today with vaginal spotting since Tuesday. Pt states she has cramping when the potting occurs and she is also experiencing lightheadedness.

## 2018-06-09 ENCOUNTER — Encounter: Payer: Self-pay | Admitting: Obstetrics and Gynecology

## 2018-06-09 ENCOUNTER — Ambulatory Visit (INDEPENDENT_AMBULATORY_CARE_PROVIDER_SITE_OTHER): Payer: Medicaid Other | Admitting: Obstetrics and Gynecology

## 2018-06-09 VITALS — BP 143/78 | HR 90 | Wt 133.0 lb

## 2018-06-09 DIAGNOSIS — Z3481 Encounter for supervision of other normal pregnancy, first trimester: Secondary | ICD-10-CM

## 2018-06-09 DIAGNOSIS — O4692 Antepartum hemorrhage, unspecified, second trimester: Secondary | ICD-10-CM

## 2018-06-09 LAB — POCT URINALYSIS DIPSTICK OB
BILIRUBIN UA: NEGATIVE
Blood, UA: NEGATIVE
Glucose, UA: NEGATIVE
KETONES UA: NEGATIVE
Leukocytes, UA: NEGATIVE
NITRITE UA: NEGATIVE
PROTEIN: NEGATIVE
Spec Grav, UA: 1.01 (ref 1.010–1.025)
Urobilinogen, UA: 0.2 E.U./dL
pH, UA: 6.5 (ref 5.0–8.0)

## 2018-06-09 NOTE — Progress Notes (Signed)
Pt here today for ROB. Pt still having slight bleeding, but otherwise no concerns.

## 2018-06-09 NOTE — Progress Notes (Signed)
ROB: Has occasional spotting after intercourse-bright red.  No heavy bleeding.  Previous ultrasound revealed no evidence of SCH. desires AFP today.  FAS scheduled next visit.

## 2018-06-11 LAB — AFP TETRA
DIA Mom Value: 0.84
DIA Value (EIA): 153.54 pg/mL
DSR (By Age)    1 IN: 1063
DSR (Second Trimester) 1 IN: 10000
Gestational Age: 16 WEEKS
MATERNAL AGE AT EDD: 24.2 a
MSAFP MOM: 1.22
MSAFP: 43.9 ng/mL
MSHCG MOM: 0.81
MSHCG: 37685 m[IU]/mL
OSB RISK: 6110
Test Results:: NEGATIVE
UE3 MOM: 1.53
WEIGHT: 133 [lb_av]
uE3 Value: 1.41 ng/mL

## 2018-07-07 ENCOUNTER — Ambulatory Visit (INDEPENDENT_AMBULATORY_CARE_PROVIDER_SITE_OTHER): Payer: Medicaid Other

## 2018-07-07 ENCOUNTER — Ambulatory Visit (INDEPENDENT_AMBULATORY_CARE_PROVIDER_SITE_OTHER): Payer: Medicaid Other | Admitting: Obstetrics and Gynecology

## 2018-07-07 ENCOUNTER — Other Ambulatory Visit: Payer: Self-pay | Admitting: Obstetrics and Gynecology

## 2018-07-07 VITALS — BP 110/68 | HR 91 | Wt 137.3 lb

## 2018-07-07 DIAGNOSIS — Z23 Encounter for immunization: Secondary | ICD-10-CM | POA: Diagnosis not present

## 2018-07-07 DIAGNOSIS — Z3492 Encounter for supervision of normal pregnancy, unspecified, second trimester: Secondary | ICD-10-CM

## 2018-07-07 DIAGNOSIS — Z3482 Encounter for supervision of other normal pregnancy, second trimester: Secondary | ICD-10-CM

## 2018-07-07 DIAGNOSIS — Z363 Encounter for antenatal screening for malformations: Secondary | ICD-10-CM | POA: Diagnosis not present

## 2018-07-07 LAB — POCT URINALYSIS DIPSTICK OB
Bilirubin, UA: NEGATIVE
GLUCOSE, UA: NEGATIVE
Ketones, UA: NEGATIVE
Leukocytes, UA: NEGATIVE
NITRITE UA: NEGATIVE
POC,PROTEIN,UA: NEGATIVE
RBC UA: NEGATIVE
Spec Grav, UA: 1.01 (ref 1.010–1.025)
Urobilinogen, UA: 0.2 E.U./dL
pH, UA: 6 (ref 5.0–8.0)

## 2018-07-07 NOTE — Progress Notes (Signed)
ROB: Doing well, no complaints. S/p normal anatomy scan.  For flu vaccine today.  RTC in 4 weeks.  

## 2018-07-07 NOTE — Progress Notes (Signed)
ROB-Pt stated that she is doing well no concerns. Pt received her flu vaccine today.

## 2018-08-05 NOTE — L&D Delivery Note (Signed)
Delivery Summary for [facility]  Labor Events:   Preterm labor:   Rupture date: 11/26/2018  Rupture time: 9:14 PM  Rupture type: Artificial Intact Bulging bag of water  Fluid Color:   Induction:   Augmentation:   Complications:   Cervical ripening:          Delivery:   Episiotomy:   Lacerations:   Repair suture:   Repair # of packets:   Blood loss (ml): 200   Information for the patient's newborn:  Arshi, Chuc [[id number]]    Delivery 11/26/2018 10:01 PM by  Vaginal, Spontaneous Sex:  female Gestational Age: [redacted]w[redacted]d Delivery Clinician:   Living?:         APGARS  One minute Five minutes Ten minutes  Skin color:        Heart rate:        Grimace:        Muscle tone:        Breathing:        Totals:         Presentation/position:      Resuscitation:   Cord information:    Disposition of cord blood:     Blood gases sent?  Complications:   Placenta: Delivered:       appearance Newborn Measurements: Weight:    Height:    Head circumference:    Chest circumference:    Other providers:    Additional  information: Forceps:   Vacuum:   Breech:   Observed anomalies       Delivery Note At 10:01 PM a viable and healthy female was delivered via Vaginal, Spontaneous (Presentation: Vertex; ROA position).  APGAR: 9, 9; weight 7 lb 5.8 oz (3340 g).   Placenta status: spontaneously removed, intact.  Cord:3-vessel with the following complications: nuchal cord x 1, reducible after delivery of fetal head.  Cord pH: not obtained. Delayed cord clamping observed.  Vagina and cervix examined due to persistent slow ooze after delivery with firm uterine fundus, no lacerations noted. Resolved after pressure applied to the cervix with ring forceps.    Anesthesia: Epidural  Episiotomy: None Lacerations: None Suture Repair: None Est. Blood Loss (mL): 200  Mom to postpartum.  Baby to Couplet care / Skin to Skin.  Hildred Laser 11/26/18, 10:29 PM

## 2018-08-06 ENCOUNTER — Encounter: Payer: Self-pay | Admitting: Obstetrics and Gynecology

## 2018-08-06 ENCOUNTER — Ambulatory Visit (INDEPENDENT_AMBULATORY_CARE_PROVIDER_SITE_OTHER): Payer: Medicaid Other | Admitting: Obstetrics and Gynecology

## 2018-08-06 VITALS — BP 116/74 | HR 108 | Wt 144.0 lb

## 2018-08-06 DIAGNOSIS — Z3482 Encounter for supervision of other normal pregnancy, second trimester: Secondary | ICD-10-CM

## 2018-08-06 LAB — POCT URINALYSIS DIPSTICK OB
BILIRUBIN UA: NEGATIVE
Blood, UA: NEGATIVE
Glucose, UA: NEGATIVE
KETONES UA: NEGATIVE
Leukocytes, UA: NEGATIVE
Nitrite, UA: NEGATIVE
SPEC GRAV UA: 1.01 (ref 1.010–1.025)
Urobilinogen, UA: 0.2 E.U./dL
pH, UA: 6.5 (ref 5.0–8.0)

## 2018-08-06 NOTE — Progress Notes (Signed)
Patient comes in today for OB visit. Patient has no problems or concerns today.

## 2018-08-06 NOTE — Progress Notes (Signed)
ROB: No complaints.  Daily fetal movement.  1 hour GCT next visit.

## 2018-08-14 ENCOUNTER — Telehealth: Payer: Self-pay

## 2018-08-14 ENCOUNTER — Telehealth: Payer: Self-pay | Admitting: Obstetrics and Gynecology

## 2018-08-14 MED ORDER — OSELTAMIVIR PHOSPHATE 75 MG PO CAPS: 75.0000 mg | ORAL_CAPSULE | Freq: Every day | ORAL | 0 refills | Status: AC

## 2018-08-14 NOTE — Telephone Encounter (Signed)
Tamiflu 75mg  #5 1 tab daily X 0 refills sent to pharmacy [facility] [address] per pt. Pt notified via [employer].

## 2018-08-14 NOTE — Telephone Encounter (Signed)
The patient called and stated that she woke up feeling sick and weak. The patient also stated that she is having chills, feeling hot and a host of other symptoms. Please advise.

## 2018-09-02 ENCOUNTER — Other Ambulatory Visit: Payer: Self-pay | Admitting: Surgical

## 2018-09-02 DIAGNOSIS — Z3482 Encounter for supervision of other normal pregnancy, second trimester: Secondary | ICD-10-CM

## 2018-09-03 ENCOUNTER — Other Ambulatory Visit: Payer: Medicaid Other

## 2018-09-03 ENCOUNTER — Encounter: Payer: Self-pay | Admitting: Obstetrics and Gynecology

## 2018-09-03 ENCOUNTER — Ambulatory Visit (INDEPENDENT_AMBULATORY_CARE_PROVIDER_SITE_OTHER): Payer: Medicaid Other | Admitting: Obstetrics and Gynecology

## 2018-09-03 VITALS — BP 119/70 | HR 96 | Ht 62.0 in | Wt 143.6 lb

## 2018-09-03 DIAGNOSIS — Z3483 Encounter for supervision of other normal pregnancy, third trimester: Secondary | ICD-10-CM

## 2018-09-03 DIAGNOSIS — Z23 Encounter for immunization: Secondary | ICD-10-CM

## 2018-09-03 DIAGNOSIS — Z3482 Encounter for supervision of other normal pregnancy, second trimester: Secondary | ICD-10-CM

## 2018-09-03 LAB — POCT URINALYSIS DIPSTICK OB
Bilirubin, UA: NEGATIVE
Blood, UA: NEGATIVE
Glucose, UA: NEGATIVE
KETONES UA: NEGATIVE
Nitrite, UA: NEGATIVE
POC,PROTEIN,UA: NEGATIVE
Spec Grav, UA: 1.01 (ref 1.010–1.025)
Urobilinogen, UA: 0.2 E.U./dL
pH, UA: 7 (ref 5.0–8.0)

## 2018-09-03 MED ORDER — TETANUS-DIPHTH-ACELL PERTUSSIS 5-2.5-18.5 LF-MCG/0.5 IM SUSP: 0.5000 mL | Freq: Once | INTRAMUSCULAR | Status: DC

## 2018-09-03 NOTE — Addendum Note (Signed)
Addended by: Dorian Pod on: 09/03/2018 09:26 AM   Modules accepted: Orders

## 2018-09-03 NOTE — Progress Notes (Signed)
ROB: Small amount of swelling in the evenings-gone by morning.  No swelling at this time.  1 hour GCT today.

## 2018-09-03 NOTE — Progress Notes (Signed)
Patient comes in today for ROB visit. She is having edema in hands, ankles, and feet. Patient signed blood transfusion form today. She will have Tdap today.

## 2018-09-04 LAB — CBC
HEMATOCRIT: 28.8 % — AB (ref 34.0–46.6)
HEMOGLOBIN: 9.4 g/dL — AB (ref 11.1–15.9)
MCH: 25.8 pg — ABNORMAL LOW (ref 26.6–33.0)
MCHC: 32.6 g/dL (ref 31.5–35.7)
MCV: 79 fL (ref 79–97)
Platelets: 305 10*3/uL (ref 150–450)
RBC: 3.65 x10E6/uL — ABNORMAL LOW (ref 3.77–5.28)
RDW: 12.7 % (ref 11.7–15.4)
WBC: 9.4 10*3/uL (ref 3.4–10.8)

## 2018-09-04 LAB — RPR: RPR: NONREACTIVE

## 2018-09-04 LAB — GLUCOSE, 1 HOUR GESTATIONAL: Gestational Diabetes Screen: 138 mg/dL (ref 65–139)

## 2018-09-09 ENCOUNTER — Other Ambulatory Visit: Payer: Self-pay

## 2018-09-09 ENCOUNTER — Observation Stay
Admission: EM | Admit: 2018-09-09 | Discharge: 2018-09-09 | Disposition: A | Discharge status: Home / Self Care | Payer: Medicaid Other | Attending: Obstetrics and Gynecology | Admitting: Obstetrics and Gynecology

## 2018-09-09 DIAGNOSIS — O26853 Spotting complicating pregnancy, third trimester: Secondary | ICD-10-CM | POA: Diagnosis not present

## 2018-09-09 DIAGNOSIS — O26893 Other specified pregnancy related conditions, third trimester: Secondary | ICD-10-CM | POA: Insufficient documentation

## 2018-09-09 DIAGNOSIS — M545 Low back pain: Secondary | ICD-10-CM

## 2018-09-09 DIAGNOSIS — Z3A29 29 weeks gestation of pregnancy: Secondary | ICD-10-CM | POA: Diagnosis not present

## 2018-09-09 DIAGNOSIS — O4693 Antepartum hemorrhage, unspecified, third trimester: Principal | ICD-10-CM | POA: Insufficient documentation

## 2018-09-09 DIAGNOSIS — R109 Unspecified abdominal pain: Secondary | ICD-10-CM | POA: Diagnosis not present

## 2018-09-09 DIAGNOSIS — O26899 Other specified pregnancy related conditions, unspecified trimester: Secondary | ICD-10-CM

## 2018-09-09 LAB — URINALYSIS, ROUTINE W REFLEX MICROSCOPIC
BILIRUBIN URINE: NEGATIVE
Glucose, UA: NEGATIVE mg/dL
Hgb urine dipstick: NEGATIVE
Ketones, ur: NEGATIVE mg/dL
Leukocytes, UA: NEGATIVE
NITRITE: NEGATIVE
Protein, ur: NEGATIVE mg/dL
Specific Gravity, Urine: 1.004 — ABNORMAL LOW (ref 1.005–1.030)
pH: 7 (ref 5.0–8.0)

## 2018-09-09 MED ORDER — ACETAMINOPHEN 500 MG PO TABS
1000.0000 mg | ORAL_TABLET | Freq: Four times a day (QID) | ORAL | Status: DC | PRN
  Administered 2018-09-09: 1000 mg via ORAL
  Filled 2018-09-09: qty 2

## 2018-09-09 NOTE — Discharge Instructions (Signed)
Drink plenty of water and get plenty of rest. Call your provider for any other concerns. Keep your next scheduled appointment.

## 2018-09-09 NOTE — Progress Notes (Signed)
Pt d/c home, verbal and written instructions given to pt, pt verbalized understanding. Will notify OBGYN or return to L&D if she has further issues.

## 2018-09-09 NOTE — OB Triage Note (Signed)
Pt G2P1 [redacted]W[redacted]d presents to L&D c/o sharp abdominal pain that started 09/08/2018 @0600 . Pt stated she had some bright red bleeding when she wiped this morning. Denies Leaking of fluid, reports decreased fetal movement. External monitors applied, FHT 145, Pt VS WNL. Dr. Valentino Saxon notified of pt

## 2018-09-09 NOTE — Final Progress Note (Addendum)
L&D OB Triage Note  [facility] Marcia Hodges is a 23 y.o. G2P1001 female at [redacted]w[redacted]d, EDD Estimated Date of Delivery: 11/22/18 who presented to triage for complaints of low back pain and cramping, as well as noting blood when wiping after urinating.  She was evaluated by the nurses with no significant findings of preterm labor or contractions. She was noted to have some uterine irritability. Vital signs stable. An NST was performed and has been reviewed by MD. She was treated with prn  Tylenol.   NST INTERPRETATION: Indications: rule out uterine contractions  Mode: External Baseline Rate (A): 155 bpm Variability: Moderate Accelerations: 15 x 15 Decelerations: None     Contraction Frequency (min): none  Impression: reactive    Labs:  Results for orders placed or performed during the hospital encounter of 09/09/18  Urinalysis, Routine w reflex microscopic  Result Value Ref Range   Color, Urine STRAW (A) YELLOW   APPearance CLEAR (A) CLEAR   Specific Gravity, Urine 1.004 (L) 1.005 - 1.030   pH 7.0 5.0 - 8.0   Glucose, UA NEGATIVE NEGATIVE mg/dL   Hgb urine dipstick NEGATIVE NEGATIVE   Bilirubin Urine NEGATIVE NEGATIVE   Ketones, ur NEGATIVE NEGATIVE mg/dL   Protein, ur NEGATIVE NEGATIVE mg/dL   Nitrite NEGATIVE NEGATIVE   Leukocytes, UA NEGATIVE NEGATIVE    Plan: NST performed was reviewed and was found to be reactive. She was discharged home with bleeding/preterm labor precautions.  Continue routine prenatal care. Follow up with OB/GYN as previously scheduled.     Hildred Laser, MD [facility]

## 2018-09-10 ENCOUNTER — Encounter: Payer: Self-pay | Admitting: Obstetrics and Gynecology

## 2018-09-10 ENCOUNTER — Telehealth: Payer: Self-pay

## 2018-09-10 ENCOUNTER — Ambulatory Visit (INDEPENDENT_AMBULATORY_CARE_PROVIDER_SITE_OTHER): Payer: Medicaid Other | Admitting: Obstetrics and Gynecology

## 2018-09-10 VITALS — BP 118/81 | HR 106 | Wt 146.0 lb

## 2018-09-10 DIAGNOSIS — N898 Other specified noninflammatory disorders of vagina: Secondary | ICD-10-CM

## 2018-09-10 DIAGNOSIS — O26893 Other specified pregnancy related conditions, third trimester: Secondary | ICD-10-CM

## 2018-09-10 DIAGNOSIS — Z3483 Encounter for supervision of other normal pregnancy, third trimester: Secondary | ICD-10-CM

## 2018-09-10 DIAGNOSIS — O4703 False labor before 37 completed weeks of gestation, third trimester: Secondary | ICD-10-CM

## 2018-09-10 LAB — POCT URINALYSIS DIPSTICK OB
Bilirubin, UA: NEGATIVE
Blood, UA: NEGATIVE
Glucose, UA: NEGATIVE
Ketones, UA: NEGATIVE
Leukocytes, UA: NEGATIVE
Nitrite, UA: NEGATIVE
POC,PROTEIN,UA: NEGATIVE
Urobilinogen, UA: 0.2 E.U./dL
pH, UA: 6.5 (ref 5.0–8.0)

## 2018-09-10 MED ORDER — NIFEDIPINE 10 MG PO CAPS: 10.0000 mg | ORAL_CAPSULE | Freq: Three times a day (TID) | ORAL | 0 refills | Status: DC

## 2018-09-10 NOTE — Telephone Encounter (Signed)
Patient called the office to report she went to the ED last night with c/o contractions. ED advised patient she be seen in the office today. Patient denies spotting, loss of fluid. States she is staying hydrated.I requested patient- while waiting for my return call- to start timing contractions/ intensity of contractions. After speaking with Dr Valentino Saxon she requested I add pt to her schedule at 11 AM. A call was placed to patient- she reports the contractions are 10 minutes apart and are 5-6 on the pain scale. Pt was advised to come to office at 11AM to see Dr. Valentino Saxon. Pt understood and agreed with plan.

## 2018-09-10 NOTE — Progress Notes (Signed)
Pt stated that she is having contractions every 10 minutes with tightens in the abd and back area yesterday and all morning.

## 2018-09-11 NOTE — Progress Notes (Signed)
Problem OB: Patient complains of contraction like pain, has been ongoing for several days. Also noting mucous discharge. Was seen in triage yesterday and was ruled out for preterm labor. She still notes contractions q 9-10 mins. She notes she has been hydrating, taking Tylenol with minimal relief. Pain ranges from 4-7/10. Discussed use of Procardia for continued contractions. To use for a total of 2 weeks then can reassess. Exam notes cervix closed. Wet prep done, was negative. RTC in 2 weeks. PTL precautions given.

## 2018-09-29 ENCOUNTER — Ambulatory Visit (INDEPENDENT_AMBULATORY_CARE_PROVIDER_SITE_OTHER): Payer: Medicaid Other | Admitting: Obstetrics and Gynecology

## 2018-09-29 ENCOUNTER — Encounter: Payer: Self-pay | Admitting: Obstetrics and Gynecology

## 2018-09-29 VITALS — BP 108/70 | HR 102 | Wt 149.1 lb

## 2018-09-29 DIAGNOSIS — Z3483 Encounter for supervision of other normal pregnancy, third trimester: Secondary | ICD-10-CM

## 2018-09-29 LAB — POCT URINALYSIS DIPSTICK OB
Bilirubin, UA: NEGATIVE
Blood, UA: NEGATIVE
Glucose, UA: NEGATIVE
Ketones, UA: NEGATIVE
LEUKOCYTES UA: NEGATIVE
NITRITE UA: NEGATIVE
PROTEIN: NEGATIVE
Spec Grav, UA: 1.01 (ref 1.010–1.025)
Urobilinogen, UA: 1 E.U./dL
pH, UA: 7 (ref 5.0–8.0)

## 2018-09-29 NOTE — Progress Notes (Signed)
ROB: Doing well, no complaints. Denies any further contractions. Discussed pain management in labor, considering epidural. Desires circumcision for female infant.  RTC in 2 weeks.

## 2018-09-29 NOTE — Progress Notes (Signed)
ROB-pt present today for prenatal care. Pt stated that she is doing well. No problems.

## 2018-10-05 ENCOUNTER — Emergency Department: Payer: Medicaid Other

## 2018-10-05 ENCOUNTER — Emergency Department
Admission: EM | Admit: 2018-10-05 | Discharge: 2018-10-05 | Disposition: A | Discharge status: Home / Self Care | Payer: Medicaid Other | Attending: Emergency Medicine | Admitting: Emergency Medicine

## 2018-10-05 ENCOUNTER — Encounter: Payer: Self-pay | Admitting: Emergency Medicine

## 2018-10-05 ENCOUNTER — Telehealth: Payer: Self-pay | Admitting: Surgical

## 2018-10-05 DIAGNOSIS — R079 Chest pain, unspecified: Secondary | ICD-10-CM | POA: Diagnosis present

## 2018-10-05 DIAGNOSIS — O9989 Other specified diseases and conditions complicating pregnancy, childbirth and the puerperium: Secondary | ICD-10-CM | POA: Insufficient documentation

## 2018-10-05 DIAGNOSIS — Z3A34 34 weeks gestation of pregnancy: Secondary | ICD-10-CM | POA: Diagnosis not present

## 2018-10-05 DIAGNOSIS — E876 Hypokalemia: Secondary | ICD-10-CM

## 2018-10-05 DIAGNOSIS — R0602 Shortness of breath: Secondary | ICD-10-CM

## 2018-10-05 DIAGNOSIS — Z79899 Other long term (current) drug therapy: Secondary | ICD-10-CM | POA: Diagnosis not present

## 2018-10-05 DIAGNOSIS — O99283 Endocrine, nutritional and metabolic diseases complicating pregnancy, third trimester: Secondary | ICD-10-CM | POA: Insufficient documentation

## 2018-10-05 LAB — BASIC METABOLIC PANEL
Anion gap: 9 (ref 5–15)
CO2: 23 mmol/L (ref 22–32)
Calcium: 8.7 mg/dL — ABNORMAL LOW (ref 8.9–10.3)
Chloride: 103 mmol/L (ref 98–111)
Creatinine, Ser: <0.3 mg/dL — ABNORMAL LOW (ref 0.44–1.00)
Glucose, Bld: 102 mg/dL — ABNORMAL HIGH (ref 70–99)
POTASSIUM: 2.9 mmol/L — AB (ref 3.5–5.1)
Sodium: 135 mmol/L (ref 135–145)

## 2018-10-05 LAB — CBC
HCT: 30 % — ABNORMAL LOW (ref 36.0–46.0)
HEMOGLOBIN: 9.2 g/dL — AB (ref 12.0–15.0)
MCH: 23.1 pg — ABNORMAL LOW (ref 26.0–34.0)
MCHC: 30.7 g/dL (ref 30.0–36.0)
MCV: 75.4 fL — ABNORMAL LOW (ref 80.0–100.0)
Platelets: 284 10*3/uL (ref 150–400)
RBC: 3.98 MIL/uL (ref 3.87–5.11)
RDW: 14.6 % (ref 11.5–15.5)
WBC: 10.4 10*3/uL (ref 4.0–10.5)
nRBC: 0.2 % (ref 0.0–0.2)

## 2018-10-05 LAB — FIBRIN DERIVATIVES D-DIMER (ARMC ONLY): Fibrin derivatives D-dimer (ARMC): 1256.19 ng/mL (FEU) — ABNORMAL HIGH (ref 0.00–499.00)

## 2018-10-05 LAB — TROPONIN I
Troponin I: <0.03 ng/mL (ref <0.03)
Troponin I: <0.03 ng/mL (ref <0.03)

## 2018-10-05 LAB — BRAIN NATRIURETIC PEPTIDE: B Natriuretic Peptide: 59 pg/mL (ref 0.0–100.0)

## 2018-10-05 MED ORDER — POTASSIUM CHLORIDE CRYS ER 20 MEQ PO TBCR
40.0000 meq | EXTENDED_RELEASE_TABLET | Freq: Once | ORAL | Status: AC
  Administered 2018-10-05: 40 meq via ORAL
  Filled 2018-10-05: qty 2

## 2018-10-05 MED ORDER — IOHEXOL 350 MG/ML SOLN
75.0000 mL | Freq: Once | INTRAVENOUS | Status: AC | PRN
  Administered 2018-10-05: 75 mL via INTRAVENOUS

## 2018-10-05 NOTE — ED Triage Notes (Signed)
Patient states that at approx. 8am while she was at work, sitting, she began to feel a heaviness in her chest.  Patient reports feeling very hot and diaphoretic and light headed.  Patient states, "I almost passed out."  Patient states when she stood up she immediately felt nauseous and then vomited.  Patient states now, she has a headache and feels weak and nauseous.  Patient is pale.  Patient is [redacted] weeks pregnant.  Denies any pregnancy complications.

## 2018-10-05 NOTE — Telephone Encounter (Signed)
Ok. Thank you.

## 2018-10-05 NOTE — Telephone Encounter (Signed)
Patient called in this morning due to having shortness of breath and feeling like a weight on her chest. She currently has a headache and has been vomiting. All of this started this morning at 7:30AM. Patient states that she has not felt the baby move since this started at 7:30 this morning. I explained we did not have MD in the office so she would need to go to the hospital to be seen.

## 2018-10-05 NOTE — ED Notes (Addendum)
First Nurse Note: Patient states "I felt SHOB and like there was a weight on my chest" this AM.  Now complaining of weakness, HA, and nausea.  [redacted] weeks pregnant.  Patient alert and oriented, speaking in full sentences.

## 2018-10-05 NOTE — ED Provider Notes (Signed)
[facility] Emergency Department Provider Note  ____________________________________________  Time seen: Approximately 12:56 PM  I have reviewed the triage vital signs and the nursing notes.   HISTORY  Chief Complaint Chest Pain   HPI Marcia Hodges is a 23 y.o. female G2P1 at [redacted] weeks GA who presents for evaluation of CP and SOB. Patient reports that she was at work when she developed sudden onset of pressure in the center of her chest associated with shortness of breath.  She then became dizzy, clammy and hot.  She vomited one time.  The pain initially was severe but is now mild.  The pain is not sharp and not pleuritic.  No personal family history blood clots, recent travel immobilization, leg pain or swelling, hemoptysis.  No complications with the pregnancy.  No vaginal bleeding or fluid loss.  Patient reports that she has been having intermittent episodes of chest pain and shortness of breath since [redacted] weeks gestational age however those are usually mild and she had never mentioned that to her OB.  She denies any family history of heart disease.  No cough or congestion, no URI symptoms, no contractions.  No history of smoking or asthma.  Past Medical History:  Diagnosis Date  . 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 Headache    migraine- w/o aura    Patient Active Problem List   Diagnosis Date Noted  . Abdominal pain in pregnancy, third trimester 09/09/2018  . Migraine without aura and without status migrainosus, not intractable 10/17/2015    Past Surgical History:  Procedure Laterality Date  . wisdom teeth pulled      Prior to Admission medications   Medication Sig Start Date End Date Taking? Authorizing Provider  DICLEGIS 10-10 MG TBEC Take 2 tablets by mouth at bedtime. If symptoms persist, add one tablet in the morning and one in the afternoon 05/11/18   Hildred Laser, MD    Prenatal Vit-Fe Fumarate-FA (PRENATAL MULTIVITAMIN) TABS tablet Take 1 tablet by mouth daily at 12 noon.    [provider]    Allergies Patient has no known allergies.  Family History  Problem Relation Age of Onset  . Hypertension Father   . Diabetes Neg Hx   . Cancer Neg Hx   . Hearing loss Neg Hx     Social History Social History   Tobacco Use  . Smoking status: Never Smoker  . Smokeless tobacco: Never Used  Substance Use Topics  . Alcohol use: No    Alcohol/week: 0.0 standard drinks  . Drug use: No    Review of Systems  Constitutional: Negative for fever. + dizziness Eyes: Negative for visual changes. ENT: Negative for sore throat. Neck: No neck pain  Cardiovascular: + chest pain. Respiratory: +shortness of breath. Gastrointestinal: Negative for abdominal pain,or diarrhea. + vomiting Genitourinary: Negative for dysuria. Musculoskeletal: Negative for back pain. Skin: Negative for rash. Neurological: Negative for headaches, weakness or numbness. Psych: No SI or HI  ____________________________________________   PHYSICAL EXAM:  VITAL SIGNS: ED Triage Vitals  Enc Vitals Group     BP 10/05/18 1023 105/61     Pulse Rate 10/05/18 1023 98     Resp 10/05/18 1023 16     Temp 10/05/18 1023 98 F (36.7 C)     Temp Source 10/05/18 1023 Oral     SpO2 10/05/18 1023 100 %     Weight 10/05/18 1024 146  lb (66.2 kg)     Height 10/05/18 1024 5\' 2"  (1.575 m)     Head Circumference --      Peak Flow --      Pain Score 10/05/18 1023 6     Pain Loc --      Pain Edu? --      Excl. in GC? --     Constitutional: Alert and oriented. Well appearing and in no apparent distress. HEENT:      Head: Normocephalic and atraumatic.         Eyes: Conjunctivae are normal. Sclera is non-icteric.       Mouth/Throat: Mucous membranes are moist.       Neck: Supple with no signs of meningismus. Cardiovascular: Regular rate and rhythm. No murmurs, gallops, or rubs. 2+  symmetrical distal pulses are present in all extremities. No JVD. Respiratory: Normal respiratory effort. Lungs are clear to auscultation bilaterally. No wheezes, crackles, or rhonchi.  Gastrointestinal: Gravid, non tender, and non distended with positive bowel sounds. No rebound or guarding. Musculoskeletal: Nontender with normal range of motion in all extremities. No edema, cyanosis, or erythema of extremities. Neurologic: Normal speech and language. Face is symmetric. Moving all extremities. No gross focal neurologic deficits are appreciated. Skin: Skin is warm, dry and intact. No rash noted. Psychiatric: Mood and affect are normal. Speech and behavior are normal.  ____________________________________________   LABS (all labs ordered are listed, but only abnormal results are displayed)  Labs Reviewed  BASIC METABOLIC PANEL - Abnormal; Notable for the following components:      Result Value   Potassium 2.9 (*)    Glucose, Bld 102 (*)    BUN <5 (*)    Creatinine, Ser <0.30 (*)    Calcium 8.7 (*)    All other components within normal limits  CBC - Abnormal; Notable for the following components:   Hemoglobin 9.2 (*)    HCT 30.0 (*)    MCV 75.4 (*)    MCH 23.1 (*)    All other components within normal limits  FIBRIN DERIVATIVES D-DIMER ([facility] ONLY) - Abnormal; Notable for the following components:   Fibrin derivatives D-dimer ([facility]) 1,256.19 (*)    All other components within normal limits  TROPONIN I  TROPONIN I  BRAIN NATRIURETIC PEPTIDE   ____________________________________________  EKG  ED ECG REPORT I, Nita Sickle, the attending physician, personally viewed and interpreted this ECG.  Normal sinus rhythm, rate of 92, normal intervals, normal axis, no ST elevations or depressions.  Normal EKG. ____________________________________________  RADIOLOGY  I have personally reviewed the images performed during this visit and I agree with the Radiologist's  read.   Interpretation by Radiologist:  Dg Chest 2 View  Result Date: 10/05/2018 CLINICAL DATA:  Chest pressure.  Shortness of breath. EXAM: CHEST - 2 VIEW COMPARISON:  None. FINDINGS: No pneumothorax. The cardiomediastinal silhouette is normal. No pulmonary nodules or masses. Haziness over the left lower chest is thought to be due to overlying soft tissues. No nodules or masses. No focal infiltrates. No other acute abnormalities. IMPRESSION: No active cardiopulmonary disease. Electronically Signed   By: Gerome Sam III M.D   On: 10/05/2018 12:09   Ct Angio Chest Pe W And/or Wo Contrast  Result Date: 10/05/2018 CLINICAL DATA:  Chest pressure and shortness of breath this morning. Weakness. Headache and nausea. Thirty-four weeks pregnant. EXAM: CT ANGIOGRAPHY CHEST WITH CONTRAST TECHNIQUE: Multidetector CT imaging of the chest was performed using the standard protocol during bolus administration  of intravenous contrast. Multiplanar CT image reconstructions and MIPs were obtained to evaluate the vascular anatomy. CONTRAST:  75mL OMNIPAQUE IOHEXOL 350 MG/ML SOLN COMPARISON:  Chest x-ray 10/05/2018 and CT 02/12/2016 FINDINGS: Cardiovascular: Heart is normal size. Thoracic aorta is normal in caliber. Pulmonary arterial system is normal without evidence of emboli. Remaining vascular structures are unremarkable. Mediastinum/Nodes: No evidence of mediastinal or hilar adenopathy. Remaining mediastinal structures are normal. Lungs/Pleura: Lungs are adequately inflated without focal airspace consolidation or effusion. Airways are normal. Upper Abdomen: No acute findings. Musculoskeletal: Normal. Review of the MIP images confirms the above findings. IMPRESSION: No acute cardiopulmonary disease and no evidence of pulmonary embolism. Electronically Signed   By: Elberta Fortis M.D.   On: 10/05/2018 15:42   US Venous Img Lower Bilateral  Result Date: 10/05/2018 CLINICAL DATA:  Shortness of breath and chest pain, [redacted]  weeks pregnant EXAM: BILATERAL LOWER EXTREMITY VENOUS DOPPLER ULTRASOUND TECHNIQUE: Gray-scale sonography with graded compression, as well as color Doppler and duplex ultrasound were performed to evaluate the lower extremity deep venous systems from the level of the common femoral vein and including the common femoral, femoral, profunda femoral, popliteal and calf veins including the posterior tibial, peroneal and gastrocnemius veins when visible. The superficial great saphenous vein was also interrogated. Spectral Doppler was utilized to evaluate flow at rest and with distal augmentation maneuvers in the common femoral, femoral and popliteal veins. COMPARISON:  None. FINDINGS: RIGHT LOWER EXTREMITY Common Femoral Vein: No evidence of thrombus. Normal compressibility, respiratory phasicity and response to augmentation. Saphenofemoral Junction: No evidence of thrombus. Normal compressibility and flow on color Doppler imaging. Profunda Femoral Vein: No evidence of thrombus. Normal compressibility and flow on color Doppler imaging. Femoral Vein: No evidence of thrombus. Normal compressibility, respiratory phasicity and response to augmentation. Popliteal Vein: No evidence of thrombus. Normal compressibility, respiratory phasicity and response to augmentation. Calf Veins: No evidence of thrombus. Normal compressibility and flow on color Doppler imaging. Superficial Great Saphenous Vein: No evidence of thrombus. Normal compressibility. Venous Reflux:  None. Other Findings:  None. LEFT LOWER EXTREMITY Common Femoral Vein: No evidence of thrombus. Normal compressibility, respiratory phasicity and response to augmentation. Saphenofemoral Junction: No evidence of thrombus. Normal compressibility and flow on color Doppler imaging. Profunda Femoral Vein: No evidence of thrombus. Normal compressibility and flow on color Doppler imaging. Femoral Vein: No evidence of thrombus. Normal compressibility, respiratory phasicity and  response to augmentation. Popliteal Vein: No evidence of thrombus. Normal compressibility, respiratory phasicity and response to augmentation. Calf Veins: No evidence of thrombus. Normal compressibility and flow on color Doppler imaging. Superficial Great Saphenous Vein: No evidence of thrombus. Normal compressibility. Venous Reflux:  None. Other Findings:  None. IMPRESSION: No evidence of deep venous thrombosis. Electronically Signed   By: Alcide Clever M.D.   On: 10/05/2018 14:41     ____________________________________________   PROCEDURES  Procedure(s) performed: None Procedures Critical Care performed:  None ____________________________________________   INITIAL IMPRESSION / ASSESSMENT AND PLAN / ED COURSE   23 y.o. female G2P1 at [redacted] weeks GA who presents for evaluation of CP and SOB. Patient reports that she was at work when she developed sudden onset of severe pressure in the center of her chest associated with shortness of breath.  Patient is well-appearing with normal work of breathing, normal sats, normal vital signs, lungs are clear to auscultation with no crackles or wheezing, good air movement.  Chest x-ray with no evidence of pulmonary edema, pneumothorax or pneumonia.  EKG with no evidence of  ischemia. Will get d-dimer and doppler of b/l LE to eval for possible PE. Labs showing stable anemia. Troponin x 1 negative. No fever or cough or flu-like sx.    _________________________ 3:34 PM on 10/05/2018 -----------------------------------------  Doppler negative. D-dimer elevated. CTA pending. Troponin x 2 negative. POCUS showing good fetal movement with FHR 140.   _________________________ 3:54 PM on 10/05/2018 -----------------------------------------  CTA negative for PE.  Patient remains with normal work of breathing, no hypoxia.  At this time will discharge home with follow-up with primary care doctor and OB/GYN.  Discussed standard return precautions.   As part of my  medical decision making, I reviewed the following data within the electronic MEDICAL RECORD NUMBER Nursing notes reviewed and incorporated, Labs reviewed , EKG interpreted , Old EKG reviewed, Old chart reviewed, Radiograph reviewed , Notes from prior ED visits and [place] Controlled Substance Database    Pertinent labs & imaging results that were available during my care of the patient were reviewed by me and considered in my medical decision making (see chart for details).    ____________________________________________   FINAL CLINICAL IMPRESSION(S) / ED DIAGNOSES  Final diagnoses:  Chest pain, unspecified type  Shortness of breath  Hypokalemia      NEW MEDICATIONS STARTED DURING THIS VISIT:  ED Discharge Orders    None       Note:  This document was prepared using Dragon voice recognition software and may include unintentional dictation errors.    Don Perking, [place], MD 10/05/18 [phone]

## 2018-10-05 NOTE — ED Notes (Signed)
Sent rainbow to lab. 

## 2018-10-13 ENCOUNTER — Encounter: Payer: Self-pay | Admitting: Obstetrics and Gynecology

## 2018-10-13 ENCOUNTER — Encounter: Payer: Self-pay | Admitting: Surgical

## 2018-10-13 ENCOUNTER — Ambulatory Visit (INDEPENDENT_AMBULATORY_CARE_PROVIDER_SITE_OTHER): Payer: Medicaid Other | Admitting: Obstetrics and Gynecology

## 2018-10-13 VITALS — BP 110/68 | HR 112 | Ht 62.0 in | Wt 150.8 lb

## 2018-10-13 DIAGNOSIS — Z3483 Encounter for supervision of other normal pregnancy, third trimester: Secondary | ICD-10-CM

## 2018-10-13 LAB — POCT URINALYSIS DIPSTICK OB
Bilirubin, UA: NEGATIVE
Blood, UA: NEGATIVE
Glucose, UA: NEGATIVE
Ketones, UA: NEGATIVE
Leukocytes, UA: NEGATIVE
NITRITE UA: NEGATIVE
PROTEIN: NEGATIVE
Spec Grav, UA: 1.01 (ref 1.010–1.025)
Urobilinogen, UA: 0.2 E.U./dL
pH, UA: 6.5 (ref 5.0–8.0)

## 2018-10-13 NOTE — Progress Notes (Signed)
ROB: Patient seen in the emergency department for chest pain and shortness of breath.  She states that she gets lightheaded several times a day.  Sometimes has to pull over while driving.  ED work-up showed elevated d-dimer low potassium and hemoglobin.  Otherwise no PE and normal work-up.  Patient states that her work does not want her to return at this time.  Urgent cardiac referral for possible hypotension and evaluation of recurrent chest issues with tachycardia.

## 2018-10-14 NOTE — Progress Notes (Signed)
Cardiology Office Note   Date:  10/15/2018   ID:  [facility] [place], [place] 1995/09/01, MRN [id number]  PCP:  Fayrene Helper, NP  Cardiologist:  Lorine Bears, MD  Chief Complaint  Patient presents with  . other    Ref by Dr. Valentino Saxon for shortness of breath, chest pain from a recent visit at [facility] and is [redacted] weeks pregnant. Meds reviewed by the pt. verbally. Pt. still c/o chest pain, shortness of breath, chest pain and rapid heart beats with feeling like could black out and is dizzy at times.       History of Present Illness: Marcia Hodges is a 23 y.o. female who is referred by Dr. Hildred Laser MD for shortness of breath and chest pain. She has a known history of migraine w/o aura and w/o status migrainosus. She is currently pregnant in her third trimester at 34 weeks. She presented to the ED on 10/05/2018 for chest pain and shortness of breath. Her doppler was negative, but d-dimer was elevated.  CTA showed no evidence of pulmonary embolism or any other abnormalities.  Her labs are unremarkable including troponin and BNP.  She does have anemia with hemoglobin in the 9 range and she also was found to be hypokalemic with a potassium of 2.9.    The patient is doing well today. This is her second pregnancy, with no complications in the first one. She feels intermittent palpitations and chest pain almost everyday, with some episodes being worse than others. The two worst episodes were when she almost passed out and included shortness of breath. The day she went to the ED, she did pass out. Her iron and potassium were low when she presented to the ED.   She does not have family history of cardiac issues. Is nor aware of any thyroid issues. Never smoker and does not drink alcohol. Currently does not drink any caffeinated beverages. She sometimes gets leg swelling. She reports almost daily palpitations associated with dizziness and chest discomfort.  Past Medical History:  Diagnosis Date  .  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 Allergic rhinitis   . Anxiety and depression   . Headache    migraine- w/o aura  . Hematuria     Past Surgical History:  Procedure Laterality Date  . wisdom teeth pulled       Current Outpatient Medications  Medication Sig Dispense Refill  . Prenatal Vit-Fe Fumarate-FA (PRENATAL MULTIVITAMIN) TABS tablet Take 1 tablet by mouth daily at 12 noon.     No current facility-administered medications for this visit.     Allergies:   Patient has no known allergies.    Social History:  The patient  reports that she has never smoked. She has never used smokeless tobacco. She reports that she does not drink alcohol or use drugs.   Family History:  The patient's family history includes Hypertension in her father.    ROS:  Please see the history of present illness.   Otherwise, review of systems are positive for none.   All other systems are reviewed and negative.    PHYSICAL EXAM: VS:  BP 110/60 (BP Location: Right Arm, Patient Position: Sitting, Cuff Size: Normal)   Pulse (!) 105   Ht 5\' 2"  (1.575 m)   Wt 150 lb (68 kg)   LMP 02/20/2018 (Exact Date)   BMI 27.44 kg/m  , BMI Body mass index is 27.44  kg/m. GEN: Well nourished, well developed, in no acute distress  HEENT: normal  Neck: no JVD, carotid bruits, or masses Cardiac: RRR, rubs, or gallops,no edema, 2/6 systolic murmur appreciated in the pulmonary area Respiratory:  clear to auscultation bilaterally, normal work of breathing GI: soft, nontender, nondistended, + BS MS: no deformity or atrophy  Skin: warm and dry, no rash Neuro:  Strength and sensation are intact Psych: euthymic mood, full affect   EKG:  EKG is ordered today. The ekg ordered today demonstrates sinus bradycardia with possible left atrial enlargement.  No significant ST or T wave changes.   Recent Labs: 03/27/2018: ALT 21 10/05/2018: B Natriuretic  Peptide 59.0; BUN <5; Creatinine, Ser <0.30; Hemoglobin 9.2; Platelets 284; Potassium 2.9; Sodium 135    Lipid Panel No results found for: CHOL, TRIG, HDL, CHOLHDL, VLDL, LDLCALC, LDLDIRECT    Wt Readings from Last 3 Encounters:  10/15/18 150 lb (68 kg)  10/13/18 150 lb 12.8 oz (68.4 kg)  10/05/18 146 lb (66.2 kg)        ASSESSMENT AND PLAN:  1.  Chest pain and shortness of breath: Negative work-up for pulmonary embolism.  EKG with no ischemic changes.  Given age and lack of risk factors, doubt underlying ischemic heart disease I am going to obtain an echocardiogram to ensure no structural heart abnormalities.  2.  Palpitations and dizziness: She is not orthostatic today but there was borderline increase in her heart rate from 113-129.  I am going to check her thyroid function.  I requested a 2-week outpatient monitor.     Disposition:   FU with me PRN  I, Jesus Reyes am acting as a Neurosurgeon for Lorine Bears, M.D.  I have reviewed the above documentation for accuracy and completeness, and I agree with the above.   Signed, Lorine Bears, MD 10/15/18 [facility] [place], [place] [phone]

## 2018-10-15 ENCOUNTER — Encounter: Payer: Self-pay | Admitting: Cardiovascular Disease

## 2018-10-15 ENCOUNTER — Ambulatory Visit (INDEPENDENT_AMBULATORY_CARE_PROVIDER_SITE_OTHER): Payer: Medicaid Other | Admitting: Cardiovascular Disease

## 2018-10-15 ENCOUNTER — Other Ambulatory Visit: Payer: Self-pay

## 2018-10-15 ENCOUNTER — Ambulatory Visit (INDEPENDENT_AMBULATORY_CARE_PROVIDER_SITE_OTHER): Payer: Medicaid Other

## 2018-10-15 VITALS — BP 110/60 | HR 105 | Ht 62.0 in | Wt 150.0 lb

## 2018-10-15 DIAGNOSIS — R0602 Shortness of breath: Secondary | ICD-10-CM

## 2018-10-15 DIAGNOSIS — R000 Tachycardia, unspecified: Secondary | ICD-10-CM | POA: Diagnosis not present

## 2018-10-15 DIAGNOSIS — R002 Palpitations: Secondary | ICD-10-CM

## 2018-10-15 NOTE — Patient Instructions (Signed)
Medication Instructions:  No changes If you need a refill on your cardiac medications before your next appointment, please call your pharmacy.   Lab work: Your provider would like for you to have the following labs today: TSH  If you have labs (blood work) drawn today and your tests are completely normal, you will receive your results only by: Marland Kitchen MyChart Message (if you have MyChart) OR . A paper copy in the mail If you have any lab test that is abnormal or we need to change your treatment, we will call you to review the results.  Testing/Procedures: Your physician has requested that you have an echocardiogram. Echocardiography is a painless test that uses sound waves to create images of your heart. It provides your doctor with information about the size and shape of your heart and how well your heart's chambers and valves are working. You may receive an ultrasound enhancing agent through an IV if needed to better visualize your heart during the echo.This procedure takes approximately one hour. There are no restrictions for this procedure. This will take place at the [facility] clinic.   Your physician has recommended that you wear a 14 day Zio event monitor. Event monitors are medical devices that record the heart's electrical activity. Doctors most often [place] these monitors to diagnose arrhythmias. Arrhythmias are problems with the speed or rhythm of the heartbeat. The monitor is a small, portable device. You can wear one while you do your normal daily activities. This is usually used to diagnose what is causing palpitations/syncope (passing out).   Follow-Up: As needed with Dr. Kirke Corin

## 2018-10-16 LAB — TSH: TSH: 0.962 u[IU]/mL (ref 0.450–4.500)

## 2018-10-27 ENCOUNTER — Other Ambulatory Visit: Payer: Self-pay

## 2018-10-27 ENCOUNTER — Ambulatory Visit (INDEPENDENT_AMBULATORY_CARE_PROVIDER_SITE_OTHER): Payer: Medicaid Other | Admitting: Obstetrics and Gynecology

## 2018-10-27 ENCOUNTER — Encounter: Payer: Self-pay | Admitting: Obstetrics and Gynecology

## 2018-10-27 VITALS — BP 105/71 | HR 99 | Wt 151.6 lb

## 2018-10-27 DIAGNOSIS — O99891 Other specified diseases and conditions complicating pregnancy: Secondary | ICD-10-CM

## 2018-10-27 DIAGNOSIS — O9989 Other specified diseases and conditions complicating pregnancy, childbirth and the puerperium: Secondary | ICD-10-CM

## 2018-10-27 DIAGNOSIS — M549 Dorsalgia, unspecified: Secondary | ICD-10-CM

## 2018-10-27 DIAGNOSIS — Z3483 Encounter for supervision of other normal pregnancy, third trimester: Secondary | ICD-10-CM

## 2018-10-27 LAB — POCT URINALYSIS DIPSTICK OB
Bilirubin, UA: NEGATIVE
Blood, UA: NEGATIVE
Glucose, UA: NEGATIVE
Ketones, UA: NEGATIVE
Nitrite, UA: NEGATIVE
POC,PROTEIN,UA: NEGATIVE
Spec Grav, UA: <=1.005 — AB (ref 1.010–1.025)
Urobilinogen, UA: 0.2 E.U./dL
pH, UA: 7 (ref 5.0–8.0)

## 2018-10-27 NOTE — Patient Instructions (Addendum)
FREQUENTLY ASKED QUESTIONS FOR OBSTETRICS/PEDIATRICS    Q: Why are visitor restrictions different for maternity care areas?  [place] is restricting visitors for the duration of the patient's hospitalization. The birth of a child involves the mother, considered the patient, and a birthing partner. These are unprecedented times and we are making the exception to allow a birthing partner to be a part of the patient unit. No other guests will be allowed in our Women's & Children's Center at [facility] and at [facility].   Q: Are credentialed doulas allowed to support their existing patients?  We acknowledge the value these doula partnerships offer our care teams and many birthing families in our communities. Each laboring mother is allowed one birthing partner of the patient's choosing for her entire hospitalization.   Q: Are visitor restrictions different for hospitalized children?  Pediatric patients (infants and children under 35 years of age), such as those in the Children's Unit, Pediatric ICU and NICU, will be allowed two visitors (parents or legal guardians)   Q: Are pregnant women at an increased risk for COVID-19?  The [employer] of Obstetricians and Gynecologists (ACOG) is monitoring closely the coronavirus pandemic. With the limited information available, data does not indicate pregnant women are at an increased risk. However, pregnant women are known to be at greater risk for respiratory infections like flu. With that in mind, expectant mothers are considered an at-risk population for COVID-19, according to ACOG.   Q: Are newborns at an increased risk for COVID-19?  A limited sample of COVID-19 data with newborns indicates the virus is not transferred to the infant during pregnancy. However, postpartum separation is recommended by the Centers for Disease Control (CDC). As a result [place] recommends and strongly encourages  temporary separation of moms and babies who test positive for COVID-19 or are awaiting results to rule out COVID-19 based on CDC guidelines.   Q: If you have a suspected case of COVID-19, is the NICU couplet care room an option?  No. If either patient is considered at-risk for having COVID-19, the Women's & Children's Center at [facility] will not use the NICU couplet care rooms for that family.   Q:  is urging that elective procedures be postponed. What is considered elective for women's and children's service line?  NOT ELECTIVE: Obstetric procedures, even those with an element of choice on timing, are not considered elective. Circumcisions are considered elective procedures, however, these do not deplete blood products and other resources, which is the spirit in which the COVID-19 postponement of elective procedures was intended. Therefore, circumcisions will be allowed.   ELECTIVE: Postpartum tubal ligations are considered elective and should be postponed. Q&A for Obstetricians, Gynecologists and Pediatricians  Published October 23, 2018   [facility] supports as much as possible the medical care team working with the patient's individual needs to address timing during these unprecedented times. We seek the support of our medical care team in preserving needed resources throughout our crisis response to COVID-19.   Q: How does COVID-19 impact breastfeeding?  Breastmilk is safe for your baby - even if the mother has tested positive for COVID-19. If a COVID-19+ mother decides to breastfeed while inpatient and after discharge, we suggest proper protective equipment be worn and hand hygiene be performed before and after  feeding the infant. The new mother also has the option to pump her milk and have a healthy family member feed the baby to protect the baby from getting the virus.   Q: Should we urge patients to avoid baby showers and large gatherings?  Yes. As has been recommended  for all citizens in our communities, gatherings of 10 or more should be avoided - pregnant or not. Seek creative options for "hosting" baby showers through electronic means that honor the request for social distancing during this time of heightened awareness.   Q: Should patients miss their prenatal appointments?  No. Prenatal visits are NOT elective. While we want to limit contact and exposure, prenatal care is vital right now. Contact your physician's office if you have concerns about your visits. We are limiting outpatient office visits to the patient and one guest in order to reduce the potential for exposure.   Q: What if a pregnant woman feels sick? Should she miss her prenatal visit then?  A pregnant woman experiencing coronavirus-like symptoms (i.e., cough, fever, difficulty breathing, shortness of breath, gastrointestinal issues) should contact her pregnancy care provider by phone. Her medical professional can best determine whether she should use a video visit or possibly go to a collection site to be tested for COVID-19. Contacting her primary care provider or her pregnancy care provider is her first step.   Q: What can I do about childbirth education? All the classes are cancelled.  The Women's & Children's Centers will offer online learning to support mothers on their journey. We currently offer Understanding Childbirth, Understanding Breastfeeding and Understanding Newborn Care as an online class. Please visit our website, [email or web address], to register for an online class.   Q: How can I keep from getting COVID-19? Q&A for Obstetricians, Gynecologists and Pediatricians  Published October 23, 2018   Together, we can reduce the risk of exposure to the virus and help you and your family remain healthy and safe. One of the best ways to protect yourself is to wash your hands frequently using soap and water. Also, you should avoid touching your eyes, nose and mouth with unwashed  hands, avoid physical contact with others and practice social distancing.   Q: How are employees being informed about what to do?  [place] leaders receive a daily COVID-19 update and share relevant information with their teams. This is a time when health care professionals are called on to lead within our community. We appreciate our staff's engagement with our COVID-19 updates and encourage them to share best practices on reducing the spread of the virus with our patients and community. We are prepared to provide the exceptional COVID-19 care and coordination our community needs, expects and deserves.   Q: Who's in charge of this issue at [facility]?  The leadership structure and process established to address COVID-19 includes Chief Physician Executive Birdie Sons, MD; Infection Prevention Medical Director Judyann Munson, MD; and Infection Prevention Interim Director Jason Fila, MSN, RN, CIC, CSPDT. A team of [place] experts reflecting a broad spectrum of our workforce is meeting daily to evaluate new information we receive about COVID-19 and to adapt policies and practices accordingly.                         Published October 23, 2018      Back Pain in Pregnancy Back pain during pregnancy is common. Back pain may be caused by several factors that are related to changes  during your pregnancy. Follow these instructions at home: Managing pain, stiffness, and swelling      If directed, for sudden (acute) back pain, put ice on the painful area. ? Put ice in a plastic bag. ? Place a towel between your skin and the bag. ? Leave the ice on for 20 minutes, 2-3 times per day.  If directed, apply heat to the affected area before you exercise. Use the heat source that your health care provider recommends, such as a moist heat pack or a heating pad. ? Place a towel between your skin and the heat source. ? Leave the heat on for 20-30 minutes. ? Remove the heat if your skin turns bright  red. This is especially important if you are unable to feel pain, heat, or cold. You may have a greater risk of getting burned.  If directed, massage the affected area. Activity  Exercise as told by your health care provider. Gentle exercise is the best way to prevent or manage back pain.  Listen to your body when lifting. If lifting hurts, ask for help or bend your knees. This uses your leg muscles instead of your back muscles.  Squat down when picking up something from the floor. Do not bend over.  Only use bed rest for short periods as told by your health care provider. Bed rest should only be used for the most severe episodes of back pain. Standing, sitting, and lying down  Do not stand in one place for long periods of time.  Use good posture when sitting. Make sure your head rests over your shoulders and is not hanging forward. Use a pillow on your lower back if necessary.  Try sleeping on your side, preferably the left side, with a pregnancy support pillow or 1-2 regular pillows between your legs. ? If you have back pain after a night's rest, your bed may be too soft. ? A firm mattress may provide more support for your back during pregnancy. General instructions  Do not wear high heels.  Eat a healthy diet. Try to gain weight within your health care provider's recommendations.  Use a maternity girdle, elastic sling, or back brace as told by your health care provider.  Take over-the-counter and prescription medicines only as told by your health care provider.  Work with a physical therapist or massage therapist to find ways to manage back pain. Acupuncture or massage therapy may be helpful.  Keep all follow-up visits as told by your health care provider. This is important. Contact a health care provider if:  Your back pain interferes with your daily activities.  You have increasing pain in other parts of your body. Get help right away if:  You develop numbness, tingling,  weakness, or problems with the use of your arms or legs.  You develop severe back pain that is not controlled with medicine.  You have a change in bowel or bladder control.  You develop shortness of breath, dizziness, or you faint.  You develop nausea, vomiting, or sweating.  You have back pain that is a rhythmic, cramping pain similar to labor pains. Labor pain is usually 1-2 minutes apart, lasts for about 1 minute, and involves a bearing down feeling or pressure in your pelvis.  You have back pain and your water breaks or you have vaginal bleeding.  You have back pain or numbness that travels down your leg.  Your back pain developed after you fell.  You develop pain on one side of your  back.  You see blood in your urine.  You develop skin blisters in the area of your back pain. Summary  Back pain may be caused by several factors that are related to changes during your pregnancy.  Follow instructions as told by your health care provider for managing pain, stiffness, and swelling.  Exercise as told by your health care provider. Gentle exercise is the best way to prevent or manage back pain.  Take over-the-counter and prescription medicines only as told by your health care provider.  Keep all follow-up visits as told by your health care provider. This is important. This information is not intended to replace advice given to you by your health care provider. Make sure you discuss any questions you have with your health care provider. Document Released: 10/30/2005 Document Revised: 01/07/2018 Document Reviewed: 01/07/2018 Elsevier Interactive Patient Education  2019 [employer].

## 2018-10-27 NOTE — Progress Notes (Signed)
ROB: Patient notes vaginal pressure and low back pain. Discussed measures of relief.  36 week labs done today. RTC in 2 weeks.

## 2018-10-27 NOTE — Progress Notes (Signed)
ROB-Pt is here today for routine prenatal care and 36 weeks labs. PT stated that she is having lower back pain since 10/22/18. Pt stated no contraction, vaginal bleeding and having fetal movement. Pt stated that she is doing well other than the lower back pain.

## 2018-10-29 LAB — STREP GP B NAA: Strep Gp B NAA: NEGATIVE

## 2018-10-29 LAB — OB RESULTS CONSOLE GC/CHLAMYDIA: Gonorrhea: NEGATIVE

## 2018-10-29 LAB — GC/CHLAMYDIA PROBE AMP
Chlamydia trachomatis, NAA: NEGATIVE
Neisseria gonorrhoeae by PCR: NEGATIVE

## 2018-11-04 ENCOUNTER — Telehealth: Payer: Self-pay | Admitting: *Deleted

## 2018-11-04 NOTE — Telephone Encounter (Signed)
Left a message for the patient to call back for results. Results have been released to MyChart.

## 2018-11-04 NOTE — Telephone Encounter (Signed)
-----   Message from Iran Ouch, MD sent at 11/04/2018  1:04 PM EDT ----- Inform patient that monitor was normal with no arrhythmia.  Only sinus tachycardia which is expected.

## 2018-11-10 ENCOUNTER — Telehealth: Payer: Self-pay

## 2018-11-10 NOTE — Telephone Encounter (Signed)
Pt and went over COVID19 prescreening before her visit. Pt stated that she was not having any symptoms. Pt is also aware of the no visitor policy. Pt stated that she understood.

## 2018-11-11 ENCOUNTER — Other Ambulatory Visit: Payer: Self-pay

## 2018-11-11 ENCOUNTER — Ambulatory Visit (INDEPENDENT_AMBULATORY_CARE_PROVIDER_SITE_OTHER): Payer: Medicaid Other | Admitting: Obstetrics and Gynecology

## 2018-11-11 ENCOUNTER — Encounter: Payer: Self-pay | Admitting: Obstetrics and Gynecology

## 2018-11-11 VITALS — BP 113/77 | HR 93 | Ht 62.0 in | Wt 155.5 lb

## 2018-11-11 DIAGNOSIS — Z3483 Encounter for supervision of other normal pregnancy, third trimester: Secondary | ICD-10-CM

## 2018-11-11 LAB — POCT URINALYSIS DIPSTICK OB
Bilirubin, UA: NEGATIVE
Blood, UA: NEGATIVE
Glucose, UA: NEGATIVE
Ketones, UA: NEGATIVE
Leukocytes, UA: NEGATIVE
Nitrite, UA: NEGATIVE
POC,PROTEIN,UA: NEGATIVE
Spec Grav, UA: 1.01 (ref 1.010–1.025)
Urobilinogen, UA: 0.2 E.U./dL
pH, UA: 6 (ref 5.0–8.0)

## 2018-11-11 NOTE — Progress Notes (Signed)
Patient comes in today for ROB visit. No concerns today. 

## 2018-11-11 NOTE — Patient Instructions (Signed)
Q: Why are visitor restrictions different for maternity care areas? [place] is restricting visitors for the duration of the patient's hospitalization. The birth of a child involves the mother, considered the patient, and a birthing partner. These are unprecedented times and we are making the exception to allow a birthing partner to be a part of the patient unit. No other guests will be allowed in our Women's & Children's Center at [facility] and at [facility].  Q: Are credentialed doulas allowed to support their existing patients? We acknowledge the value these doula partnerships offer our care teams and many birthing families in our communities. Each laboring mother is allowed one birthing partner of the patient's choosing for her entire hospitalization.  Q: Are visitor restrictions different for hospitalized children? Pediatric patients (infants and children under 17 years of age), such as those in the Children's Unit, Pediatric ICU and NICU, will be allowed two visitors (parents or legal guardians)  Q: Are pregnant women at an increased risk for COVID-19? The American College of Obstetricians and Gynecologists (ACOG) is monitoring closely the coronavirus pandemic. With the limited information available, data does not indicate pregnant women are at an increased risk. However, pregnant women are known to be at greater risk for respiratory infections like flu. With that in mind, expectant mothers are considered an at-risk population for COVID-19, according to ACOG.  Q: Are newborns at an increased risk for COVID-19? A limited sample of COVID-19 data with newborns indicates the virus is not transferred to the infant during pregnancy. However, postpartum separation is recommended by the Centers for Disease Control (CDC). As a result [place] recommends and strongly encourages temporary separation of moms and babies who test positive for COVID-19 or are awaiting results to rule out  COVID-19 based on CDC guidelines.  Q: If you have a suspected case of COVID-19, is the NICU couplet care room an option? No. If either patient is considered at-risk for having COVID-19, the Women's & Children's Center at [facility] will not use the NICU couplet care rooms for that family.  Q: [place] is urging that elective procedures be postponed. What is considered elective for women's and children's service line? NOT ELECTIVE: Obstetric procedures, even those with an element of choice on timing, are not considered elective. Circumcisions are considered elective procedures, however, these do not deplete blood products and other resources, which is the spirit in which the COVID-19 postponement of elective procedures was intended. Therefore, circumcisions will be allowed.  ELECTIVE: Postpartum tubal ligations are considered elective and should be postponed.  [place] supports as much as possible the medical care team working with the patient's individual needs to address timing during these unprecedented times. We seek the support of our medical care team in preserving needed resources throughout our crisis response to COVID-19.  Q: How does COVID-19 impact breastfeeding? Breastmilk is safe for your baby - even if the mother has tested positive for COVID-19. We suggest the mother pump her milk and have a healthy family member feed the baby to protect the baby from getting the virus.  If a COVID-19+ mother decides to breastfeed after discharge, we suggest proper protective equipment be worn and hand hygiene be performed before and after feeding the infant.  Q: Should we urge patients to avoid baby showers and large gatherings? Yes. As has been recommended for all citizens in our communities, gatherings of 10 or more should be avoided - pregnant or not. Seek creative options   for "hosting" baby showers through electronic means that honor the request for social distancing during this  time of heightened awareness.  Q: Should patients miss their prenatal appointments? No. Prenatal visits are NOT elective. While we want to limit contact and exposure, prenatal care is vital right now. Contact your physician's office if you have concerns about your visits. We are limiting outpatient office visits to the patient and one guest in order to reduce the potential for exposure.  Q: What if a pregnant woman feels sick? Should she miss her prenatal visit then? A pregnant woman experiencing coronavirus-like symptoms (i.e., cough, fever, difficulty breathing, shortness of breath, gastrointestinal issues) should contact her pregnancy care provider by phone. Her medical professional can best determine whether she should use a video visit or possibly go to a collection site to be tested for COVID-19. Contacting her primary care provider or her pregnancy care provider is her first step.  Q: What can I do about childbirth education? All the classes are cancelled. The Women's [facility] Children's Centers will offer online learning to support mothers on their journey.  We currently offer Understanding Childbirth, Understanding Breastfeeding and Understanding Newborn Care as an online class.  Please visit our website, [email or web address], to register for an online class.  Q: How can I keep from getting COVID-19? Together, we can reduce the risk of exposure to the virus and help you and your family remain healthy and safe. One of the best ways to protect yourself is to wash your hands frequently using soap and water. Also, you should avoid touching your eyes, nose and mouth with unwashed hands, avoid physical contact with others and practice social distancing.   

## 2018-11-11 NOTE — Progress Notes (Signed)
ROB: No complaints.  Signs and symptoms of labor discussed. 

## 2018-11-11 NOTE — Addendum Note (Signed)
Addended by: Dorian Pod on: 11/11/2018 03:24 PM   Modules accepted: Orders

## 2018-11-12 ENCOUNTER — Encounter: Payer: Self-pay | Admitting: *Deleted

## 2018-11-12 ENCOUNTER — Other Ambulatory Visit: Payer: Self-pay

## 2018-11-12 ENCOUNTER — Observation Stay
Admission: EM | Admit: 2018-11-12 | Discharge: 2018-11-12 | Disposition: A | Discharge status: Home / Self Care | Payer: Medicaid Other | Attending: Obstetrics and Gynecology | Admitting: Obstetrics and Gynecology

## 2018-11-12 DIAGNOSIS — O479 False labor, unspecified: Secondary | ICD-10-CM | POA: Diagnosis present

## 2018-11-12 DIAGNOSIS — O471 False labor at or after 37 completed weeks of gestation: Secondary | ICD-10-CM | POA: Diagnosis not present

## 2018-11-12 DIAGNOSIS — Z3A38 38 weeks gestation of pregnancy: Secondary | ICD-10-CM | POA: Insufficient documentation

## 2018-11-12 DIAGNOSIS — Z3490 Encounter for supervision of normal pregnancy, unspecified, unspecified trimester: Secondary | ICD-10-CM

## 2018-11-12 DIAGNOSIS — R109 Unspecified abdominal pain: Secondary | ICD-10-CM | POA: Diagnosis present

## 2018-11-12 DIAGNOSIS — O26893 Other specified pregnancy related conditions, third trimester: Principal | ICD-10-CM | POA: Insufficient documentation

## 2018-11-12 NOTE — OB Triage Note (Addendum)
Recvd pt from ED. Pt c/o contractions that started around 10am. States they were not bad but have progressively gotten worse. States they are about 3-4 min apart. Rates pain a 4 out of 10. No vaginal bleeding or LOF. Has not felt baby move a ton earlier today but is feeling him now. Pt had intercourse several hours ago.

## 2018-11-12 NOTE — Discharge Instructions (Signed)
Come back if:  Decreased fetal movement Heavy vaginal bleeding Temp over 100.4 Contractions every 3-5 min that last at least one hour and are stronger than the contractions you are having now Big gush of fluids  Get plenty of rest and stay well hydrated!  May take tylenol as needed Warm baths

## 2018-11-14 NOTE — Discharge Summary (Signed)
L&D OB Triage Note  [facility] Marcia Hodges is a 23 y.o. G2P1001 female at [redacted]w[redacted]d, EDD Estimated Date of Delivery: 11/22/18 who presented to triage for complaints of contractions occurring every 3-5 minutes.  Of note, patient states that she had intercourse several hours ago.  She was evaluated by the nurses with no significant findings for active labor. Vital signs stable. An NST was performed and has been reviewed by MD.     Physical Exam:  Dilation: 1 Effacement (%): 40 Cervical Position: Posterior Station: -3 Exam by:: MBS    NST INTERPRETATION: Indications: rule out uterine contractions  Mode: External Baseline Rate (A): 130 bpm Variability: Moderate Accelerations: 15 x 15 Decelerations: None     Contraction Frequency (min): 2-5  Impression: reactive   Plan: NST performed was reviewed and was found to be reactive. Minimal change noted on cervical exam after 2 hour assessessment. She was discharged home with bleeding/labor precautions.  Continue routine prenatal care. Follow up with OB/GYN as previously scheduled.     Hildred Laser, MD [facility]

## 2018-11-16 ENCOUNTER — Ambulatory Visit (INDEPENDENT_AMBULATORY_CARE_PROVIDER_SITE_OTHER): Payer: Medicaid Other

## 2018-11-16 ENCOUNTER — Other Ambulatory Visit: Payer: Self-pay

## 2018-11-16 DIAGNOSIS — R0602 Shortness of breath: Secondary | ICD-10-CM

## 2018-11-17 ENCOUNTER — Telehealth: Payer: Self-pay | Admitting: Surgical

## 2018-11-17 NOTE — Telephone Encounter (Signed)
Coronavirus (COVID-19) Are you at risk?  Are you at risk for the Coronavirus (COVID-19)?  To be considered HIGH RISK for Coronavirus (COVID-19), you have to meet the following criteria:  . Traveled to [place], [place], [place], [place] or [place]; or in the [place] to [place], [place], [place], or [place]; and have fever, cough, and shortness of breath within the last 2 weeks of travel OR . Been in close contact with a person diagnosed with COVID-19 within the last 2 weeks and have fever, cough, and shortness of breath . IF YOU DO NOT MEET THESE CRITERIA, YOU ARE CONSIDERED LOW RISK FOR COVID-19.  What to do if you are HIGH RISK for COVID-19?  . If you are having a medical emergency, call 911. . Seek medical care right away. Before you go to a doctor's office, urgent care or emergency department, call ahead and tell them about your recent travel, contact with someone diagnosed with COVID-19, and your symptoms. You should receive instructions from your physician's office regarding next steps of care.  . When you arrive at healthcare provider, tell the healthcare staff immediately you have returned from visiting [place], [place], [place], [place] or [place]; or traveled in the [place] to [place], [place], [place], or [place]; in the last two weeks or you have been in close contact with a person diagnosed with COVID-19 in the last 2 weeks.   . Tell the health care staff about your symptoms: fever, cough and shortness of breath. . After you have been seen by a medical provider, you will be either: o Tested for (COVID-19) and discharged home on quarantine except to seek medical care if symptoms worsen, and asked to  - Stay home and avoid contact with others until you get your results (4-5 days)  - Avoid travel on public transportation if possible (such as bus, train, or airplane) or o Sent to the Emergency Department by EMS for evaluation, COVID-19 testing, and possible  admission depending on your condition and test results.  What to do if you are LOW RISK for COVID-19?  Reduce your risk of any infection by using the same precautions used for avoiding the common cold or flu:  . Wash your hands often with soap and warm water for at least 20 seconds.  If soap and water are not readily available, use an alcohol-based hand sanitizer with at least 60% alcohol.  . If coughing or sneezing, cover your mouth and nose by coughing or sneezing into the elbow areas of your shirt or coat, into a tissue or into your sleeve (not your hands). . Avoid shaking hands with others and consider head nods or verbal greetings only. . Avoid touching your eyes, nose, or mouth with unwashed hands.  . Avoid close contact with people who are sick. . Avoid places or events with large numbers of people in one location, like concerts or sporting events. . Carefully consider travel plans you have or are making. . If you are planning any travel outside or inside the [place], visit the CDC's Travelers' Health webpage for the latest health notices. . If you have some symptoms but not all symptoms, continue to monitor at home and seek medical attention if your symptoms worsen. . If you are having a medical emergency, call 911.   ADDITIONAL HEALTHCARE OPTIONS FOR PATIENTS  [facility] / e-Visit: [email or web address]         [facility]: [phone]  [facility]: [phone]                   [facility]: [phone]    Patient has been screened with no symptoms. JW 

## 2018-11-18 ENCOUNTER — Ambulatory Visit (INDEPENDENT_AMBULATORY_CARE_PROVIDER_SITE_OTHER): Payer: Medicaid Other | Admitting: Obstetrics and Gynecology

## 2018-11-18 ENCOUNTER — Encounter: Payer: Self-pay | Admitting: Obstetrics and Gynecology

## 2018-11-18 ENCOUNTER — Other Ambulatory Visit: Payer: Self-pay

## 2018-11-18 VITALS — BP 122/81 | HR 106 | Wt 157.7 lb

## 2018-11-18 DIAGNOSIS — Z3483 Encounter for supervision of other normal pregnancy, third trimester: Secondary | ICD-10-CM

## 2018-11-18 DIAGNOSIS — O480 Post-term pregnancy: Secondary | ICD-10-CM

## 2018-11-18 DIAGNOSIS — R2242 Localized swelling, mass and lump, left lower limb: Secondary | ICD-10-CM

## 2018-11-18 LAB — POCT URINALYSIS DIPSTICK OB
Bilirubin, UA: NEGATIVE
Blood, UA: NEGATIVE
Glucose, UA: NEGATIVE
Ketones, UA: NEGATIVE
Leukocytes, UA: NEGATIVE
Nitrite, UA: NEGATIVE
POC,PROTEIN,UA: NEGATIVE
Spec Grav, UA: <=1.005 — AB (ref 1.010–1.025)
Urobilinogen, UA: 0.2 E.U./dL
pH, UA: 7 (ref 5.0–8.0)

## 2018-11-18 NOTE — Progress Notes (Signed)
ROB-Pt present today for prenatal care. Pt stated that she was doing well. Pt stated that she was having fetal movement regularly, no pressure and pain, and having contractions but they are not regular and vaginal bleeding.

## 2018-11-18 NOTE — Addendum Note (Signed)
Addended by: Fabian November on: 11/18/2018 10:04 AM   Modules accepted: Orders

## 2018-11-18 NOTE — Progress Notes (Signed)
ROB: Patient notes left foot swelling and tenderness.  Discussed use of Ace bandage or compression stocking. Given labor precautions. RTC in 1 week, for BPP and growth scan for post-dates pregnancy. If no delivery by next Friday will schedule induction.

## 2018-11-19 ENCOUNTER — Telehealth: Payer: Self-pay | Admitting: *Deleted

## 2018-11-19 NOTE — Telephone Encounter (Signed)
-----   Message from [place] Ouch, MD sent at 11/19/2018  1:25 PM EDT ----- Inform patient that echo was fine.

## 2018-11-19 NOTE — Telephone Encounter (Signed)
Left a message for the patient to call back.  

## 2018-11-20 NOTE — Telephone Encounter (Signed)
Patient made aware of results via MyChart on 11/20/2018

## 2018-11-23 ENCOUNTER — Telehealth: Payer: Self-pay

## 2018-11-23 NOTE — Telephone Encounter (Signed)
Coronavirus (COVID-19) Are you at risk?  Are you at risk for the Coronavirus (COVID-19)?  To be considered HIGH RISK for Coronavirus (COVID-19), you have to meet the following criteria:  . Traveled to [place], [place], [place], [place] or [place]; or in the [place] to [place], [place], [place], or [place]; and have fever, cough, and shortness of breath within the last 2 weeks of travel OR . Been in close contact with a person diagnosed with COVID-19 within the last 2 weeks and have fever, cough, and shortness of breath . IF YOU DO NOT MEET THESE CRITERIA, YOU ARE CONSIDERED LOW RISK FOR COVID-19.  What to do if you are HIGH RISK for COVID-19?  . If you are having a medical emergency, call 911. . Seek medical care right away. Before you go to a doctor's office, urgent care or emergency department, call ahead and tell them about your recent travel, contact with someone diagnosed with COVID-19, and your symptoms. You should receive instructions from your physician's office regarding next steps of care.  . When you arrive at healthcare provider, tell the healthcare staff immediately you have returned from visiting [place], [place], [place], [place] or [place]; or traveled in the [place] to [place], [place], [place], or [place]; in the last two weeks or you have been in close contact with a person diagnosed with COVID-19 in the last 2 weeks.   . Tell the health care staff about your symptoms: fever, cough and shortness of breath. . After you have been seen by a medical provider, you will be either: o Tested for (COVID-19) and discharged home on quarantine except to seek medical care if symptoms worsen, and asked to  - Stay home and avoid contact with others until you get your results (4-5 days)  - Avoid travel on public transportation if possible (such as bus, train, or airplane) or o Sent to the Emergency Department by EMS for evaluation, COVID-19 testing, and possible  admission depending on your condition and test results.  What to do if you are LOW RISK for COVID-19?  Reduce your risk of any infection by using the same precautions used for avoiding the common cold or flu:  . Wash your hands often with soap and warm water for at least 20 seconds.  If soap and water are not readily available, use an alcohol-based hand sanitizer with at least 60% alcohol.  . If coughing or sneezing, cover your mouth and nose by coughing or sneezing into the elbow areas of your shirt or coat, into a tissue or into your sleeve (not your hands). . Avoid shaking hands with others and consider head nods or verbal greetings only. . Avoid touching your eyes, nose, or mouth with unwashed hands.  . Avoid close contact with people who are sick. . Avoid places or events with large numbers of people in one location, like concerts or sporting events. . Carefully consider travel plans you have or are making. . If you are planning any travel outside or inside the [place], visit the CDC's Travelers' Health webpage for the latest health notices. . If you have some symptoms but not all symptoms, continue to monitor at home and seek medical attention if your symptoms worsen. . If you are having a medical emergency, call 911.   ADDITIONAL HEALTHCARE OPTIONS FOR PATIENTS  [facility] / e-Visit: [email or web address]         [facility]: [phone]  [facility]: [phone]                   [facility]: [phone]   Prescreened- pos for cough only. Pt thinks its allergies. cm

## 2018-11-24 ENCOUNTER — Ambulatory Visit (INDEPENDENT_AMBULATORY_CARE_PROVIDER_SITE_OTHER): Payer: Medicaid Other

## 2018-11-24 ENCOUNTER — Other Ambulatory Visit: Payer: Self-pay

## 2018-11-24 DIAGNOSIS — O480 Post-term pregnancy: Secondary | ICD-10-CM | POA: Diagnosis not present

## 2018-11-24 DIAGNOSIS — Z3A40 40 weeks gestation of pregnancy: Secondary | ICD-10-CM

## 2018-11-25 ENCOUNTER — Encounter: Payer: Self-pay | Admitting: Obstetrics and Gynecology

## 2018-11-25 ENCOUNTER — Ambulatory Visit (INDEPENDENT_AMBULATORY_CARE_PROVIDER_SITE_OTHER): Payer: Medicaid Other | Admitting: Obstetrics and Gynecology

## 2018-11-25 ENCOUNTER — Other Ambulatory Visit: Payer: Self-pay

## 2018-11-25 VITALS — BP 115/74 | HR 93 | Ht 62.0 in | Wt 155.8 lb

## 2018-11-25 DIAGNOSIS — O480 Post-term pregnancy: Secondary | ICD-10-CM

## 2018-11-25 DIAGNOSIS — Z3483 Encounter for supervision of other normal pregnancy, third trimester: Secondary | ICD-10-CM

## 2018-11-25 LAB — POCT URINALYSIS DIPSTICK OB
Bilirubin, UA: NEGATIVE
Blood, UA: NEGATIVE
Glucose, UA: NEGATIVE
Ketones, UA: NEGATIVE
Nitrite, UA: NEGATIVE
Spec Grav, UA: 1.01 (ref 1.010–1.025)
Urobilinogen, UA: 0.2 E.U./dL
pH, UA: 7 (ref 5.0–8.0)

## 2018-11-25 NOTE — Progress Notes (Signed)
ROB: Patient without complaints.  Signs and symptoms of labor discussed.  Induction Friday discussed in detail.  All questions answered.

## 2018-11-25 NOTE — Progress Notes (Signed)
Patient comes in for ROB visit. No concerns.

## 2018-11-26 ENCOUNTER — Inpatient Hospital Stay: Payer: Medicaid Other | Admitting: Anesthesiology

## 2018-11-26 ENCOUNTER — Other Ambulatory Visit: Payer: Self-pay

## 2018-11-26 ENCOUNTER — Inpatient Hospital Stay
Admission: EM | Admit: 2018-11-26 | Discharge: 2018-11-28 | DRG: 807 | Disposition: A | Discharge status: Home / Self Care | Payer: Medicaid Other | Attending: Obstetrics and Gynecology | Admitting: Obstetrics and Gynecology

## 2018-11-26 DIAGNOSIS — D649 Anemia, unspecified: Secondary | ICD-10-CM | POA: Diagnosis present

## 2018-11-26 DIAGNOSIS — O480 Post-term pregnancy: Secondary | ICD-10-CM

## 2018-11-26 DIAGNOSIS — T454X5A Adverse effect of iron and its compounds, initial encounter: Secondary | ICD-10-CM | POA: Diagnosis present

## 2018-11-26 DIAGNOSIS — O9A23 Injury, poisoning and certain other consequences of external causes complicating the puerperium: Secondary | ICD-10-CM | POA: Diagnosis present

## 2018-11-26 DIAGNOSIS — Z3A40 40 weeks gestation of pregnancy: Secondary | ICD-10-CM

## 2018-11-26 DIAGNOSIS — O9902 Anemia complicating childbirth: Secondary | ICD-10-CM | POA: Diagnosis present

## 2018-11-26 DIAGNOSIS — O26893 Other specified pregnancy related conditions, third trimester: Secondary | ICD-10-CM | POA: Diagnosis present

## 2018-11-26 LAB — TYPE AND SCREEN
ABO/RH(D): A POS
Antibody Screen: NEGATIVE

## 2018-11-26 LAB — CBC
HCT: 30.2 % — ABNORMAL LOW (ref 36.0–46.0)
Hemoglobin: 8.9 g/dL — ABNORMAL LOW (ref 12.0–15.0)
MCH: 19.6 pg — ABNORMAL LOW (ref 26.0–34.0)
MCHC: 29.5 g/dL — ABNORMAL LOW (ref 30.0–36.0)
MCV: 66.4 fL — ABNORMAL LOW (ref 80.0–100.0)
Platelets: 341 10*3/uL (ref 150–400)
RBC: 4.55 MIL/uL (ref 3.87–5.11)
RDW: 18.1 % — ABNORMAL HIGH (ref 11.5–15.5)
WBC: 13.8 10*3/uL — ABNORMAL HIGH (ref 4.0–10.5)
nRBC: 0.1 % (ref 0.0–0.2)

## 2018-11-26 LAB — RAPID HIV SCREEN (HIV 1/2 AB+AG)
HIV 1/2 Antibodies: NONREACTIVE
HIV-1 P24 Antigen - HIV24: NONREACTIVE

## 2018-11-26 LAB — CHLAMYDIA/NGC RT PCR (ARMC ONLY)
Chlamydia Tr: NOT DETECTED
N gonorrhoeae: NOT DETECTED

## 2018-11-26 MED ORDER — LIDOCAINE HCL (PF) 1 % IJ SOLN
INTRAMUSCULAR | Status: DC | PRN
  Administered 2018-11-26: 2 mL

## 2018-11-26 MED ORDER — LIDOCAINE HCL (PF) 1 % IJ SOLN: 30.0000 mL | INTRAMUSCULAR | Status: DC | PRN

## 2018-11-26 MED ORDER — FENTANYL 2.5 MCG/ML W/ROPIVACAINE 0.15% IN NS 100 ML EPIDURAL (ARMC)
EPIDURAL | Status: AC
  Filled 2018-11-26: qty 100

## 2018-11-26 MED ORDER — OXYTOCIN BOLUS FROM INFUSION
500.0000 mL | Freq: Once | INTRAVENOUS | Status: AC
  Administered 2018-11-26: 500 mL via INTRAVENOUS

## 2018-11-26 MED ORDER — SOD CITRATE-CITRIC ACID 500-334 MG/5ML PO SOLN: 30.0000 mL | ORAL | Status: DC | PRN

## 2018-11-26 MED ORDER — OXYCODONE-ACETAMINOPHEN 5-325 MG PO TABS: 2.0000 | ORAL_TABLET | ORAL | Status: DC | PRN

## 2018-11-26 MED ORDER — MISOPROSTOL 200 MCG PO TABS
ORAL_TABLET | ORAL | Status: AC
  Filled 2018-11-26: qty 4

## 2018-11-26 MED ORDER — BUTORPHANOL TARTRATE 1 MG/ML IJ SOLN
1.0000 mg | INTRAMUSCULAR | Status: DC | PRN
  Administered 2018-11-26: 20:00:00 1 mg via INTRAVENOUS
  Filled 2018-11-26: qty 1

## 2018-11-26 MED ORDER — LACTATED RINGERS IV SOLN: 500.0000 mL | INTRAVENOUS | Status: DC | PRN

## 2018-11-26 MED ORDER — LACTATED RINGERS IV SOLN: 500.0000 mL | Freq: Once | INTRAVENOUS | Status: DC

## 2018-11-26 MED ORDER — SODIUM CHLORIDE 0.9 % IV SOLN
INTRAVENOUS | Status: DC | PRN
  Administered 2018-11-26 (×3): 5 mL via EPIDURAL

## 2018-11-26 MED ORDER — ONDANSETRON HCL 4 MG/2ML IJ SOLN: 4.0000 mg | Freq: Four times a day (QID) | INTRAMUSCULAR | Status: DC | PRN

## 2018-11-26 MED ORDER — OXYCODONE-ACETAMINOPHEN 5-325 MG PO TABS: 1.0000 | ORAL_TABLET | ORAL | Status: DC | PRN

## 2018-11-26 MED ORDER — FENTANYL 2.5 MCG/ML W/ROPIVACAINE 0.15% IN NS 100 ML EPIDURAL (ARMC)
12.0000 mL/h | EPIDURAL | Status: DC
  Administered 2018-11-26: 21:00:00 12 mL/h via EPIDURAL

## 2018-11-26 MED ORDER — PHENYLEPHRINE 40 MCG/ML (10ML) SYRINGE FOR IV PUSH (FOR BLOOD PRESSURE SUPPORT)
80.0000 ug | PREFILLED_SYRINGE | INTRAVENOUS | Status: DC | PRN
  Filled 2018-11-26: qty 10

## 2018-11-26 MED ORDER — DIPHENHYDRAMINE HCL 50 MG/ML IJ SOLN: 12.5000 mg | INTRAMUSCULAR | Status: DC | PRN

## 2018-11-26 MED ORDER — LIDOCAINE HCL (PF) 1 % IJ SOLN
INTRAMUSCULAR | Status: AC
  Filled 2018-11-26: qty 30

## 2018-11-26 MED ORDER — EPHEDRINE 5 MG/ML INJ
10.0000 mg | INTRAVENOUS | Status: DC | PRN
  Filled 2018-11-26: qty 2

## 2018-11-26 MED ORDER — ACETAMINOPHEN 325 MG PO TABS: 650.0000 mg | ORAL_TABLET | ORAL | Status: DC | PRN

## 2018-11-26 MED ORDER — LACTATED RINGERS IV SOLN
INTRAVENOUS | Status: DC
  Administered 2018-11-26: 19:00:00 via INTRAVENOUS

## 2018-11-26 MED ORDER — OXYTOCIN 10 UNIT/ML IJ SOLN
INTRAMUSCULAR | Status: AC
  Filled 2018-11-26: qty 2

## 2018-11-26 MED ORDER — OXYTOCIN 40 UNITS IN NORMAL SALINE INFUSION - SIMPLE MED
2.5000 [IU]/h | INTRAVENOUS | Status: DC
  Filled 2018-11-26 (×2): qty 1000

## 2018-11-26 MED ORDER — AMMONIA AROMATIC IN INHA
RESPIRATORY_TRACT | Status: AC
  Filled 2018-11-26: qty 10

## 2018-11-26 NOTE — OB Triage Note (Signed)
Pt is a 23yo G2P1 at [redacted]w[redacted]d that presents to the ED for ctx. Pt states the ctx started around 0900 today and are rated 6/10 scale. Pt is scheduled for IOL at 0500. Pt denies VB, LOF and states positive FM. Initial  fht 135 with monitors applied and assessing. Pt denies any complications with this pregnancy. Will continue to monitor.

## 2018-11-26 NOTE — H&P (Addendum)
Obstetric History and Physical  [facility] Mcfail is a 23 y.o. G2P1001 with IUP at [redacted]w[redacted]d presenting for complaints of contractions since 0900 . Patient states she has been having regular ever 3-4 minute contractions, none vaginal bleeding, intact membranes, with active fetal movement.    Prenatal Course Source of Care: [facility] with onset of care at 9 weeks Pregnancy complications or risks: Patient Active Problem List   Diagnosis Date Noted  . Labor and delivery indication for care or intervention 11/26/2018  . Pregnancy 11/12/2018  . Uterine contractions during pregnancy 11/12/2018  . Abdominal pain in pregnancy, third trimester 09/09/2018  . Indication for care or intervention related to labor and delivery 04/25/2016  . Migraine without aura and without status migrainosus, not intractable 10/17/2015   She plans to breastfeed She desires oral contraceptives (estrogen/progesterone) for postpartum contraception.   Prenatal labs and studies: ABO, Rh: A/Positive/-- (09/19 1521) Antibody: Negative (09/19 1521) Rubella: 4.54 (09/19 1521) RPR: Non Reactive (01/30 0953)  HBsAg: Negative (09/19 1521)  HIV: Non Reactive (09/19 1521)  YOV:ZCHYIFOY (03/24 1626) 1 hr Glucola  normal Genetic screening normal Anatomy US normal   Past Medical History:  Diagnosis Date  . 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 Allergic rhinitis   . Anxiety and depression   . Headache    migraine- w/o aura  . Hematuria     Past Surgical History:  Procedure Laterality Date  . NO PAST SURGERIES    . wisdom teeth pulled      OB History  Gravida Para Term Preterm AB Living  2 1 1  0 0 1  SAB TAB Ectopic Multiple Live Births  0 0 0 0      # Outcome Date GA Lbr Len/2nd Weight Sex Delivery Anes PTL Lv  2 Current           1 Term 04/25/16 [redacted]w[redacted]d  3210 g M Vag-Spont EPI      Social History   Socioeconomic History  .  Marital status: Single    Spouse name: Joey  . Number of children: Not on file  . Years of education: Not on file  . Highest education level: Not on file  Occupational History  . Not on file  Social Needs  . Financial resource strain: Not hard at all  . Food insecurity:    Worry: Never true    Inability: Never true  . Transportation needs:    Medical: No    Non-medical: No  Tobacco Use  . Smoking status: Never Smoker  . Smokeless tobacco: Never Used  Substance and Sexual Activity  . Alcohol use: No    Alcohol/week: 0.0 standard drinks  . Drug use: No  . Sexual activity: Yes    Partners: Male    Birth control/protection: Implant    Comment: nexplanon  Lifestyle  . Physical activity:    Days per week: 3 days    Minutes per session: 30 min  . Stress: Not at all  Relationships  . Social connections:    Talks on phone: Patient refused    Gets together: Patient refused    Attends religious service: Patient refused    Active member of club or organization: Patient refused    Attends meetings of clubs or organizations: Patient refused    Relationship status: Patient refused  Other Topics Concern  . Not on file  Social History Narrative  . Not on file  Family History  Problem Relation Age of Onset  . Hypertension Father   . Diabetes Neg Hx   . Cancer Neg Hx   . Hearing loss Neg Hx     Medications Prior to Admission  Medication Sig Dispense Refill Last Dose  . Prenatal Vit-Fe Fumarate-FA (PRENATAL MULTIVITAMIN) TABS tablet Take 1 tablet by mouth daily at 12 noon.   11/25/2018 at Unknown time    No Known Allergies  Review of Systems: Negative except for what is mentioned in HPI.  Physical Exam: BP 114/70 (BP Location: Right Arm)   Pulse 72   Temp 98.1 F (36.7 C) (Oral)   Resp 15   Ht 5\' 2"  (1.575 m)   Wt 70.3 kg   LMP 02/20/2018 (Exact Date)   SpO2 99%   BMI 28.35 kg/m  CONSTITUTIONAL: Well-developed, well-nourished female in no acute distress.  HENT:   Normocephalic, atraumatic, External right and left ear normal. Oropharynx is clear and moist EYES: Conjunctivae and EOM are normal. Pupils are equal, round, and reactive to light. No scleral icterus.  NECK: Normal range of motion, supple, no masses SKIN: Skin is warm and dry. No rash noted. Not diaphoretic. No erythema. No pallor. NEUROLOGIC: Alert and oriented to person, place, and time. Normal reflexes, muscle tone coordination. No cranial nerve deficit noted. PSYCHIATRIC: Normal mood and affect. Normal behavior. Normal judgment and thought content. CARDIOVASCULAR: Normal heart rate noted, regular rhythm RESPIRATORY: Effort and breath sounds normal, no problems with respiration noted ABDOMEN: Soft, nontender, nondistended, gravid. MUSCULOSKELETAL: Normal range of motion. No edema and no tenderness. 2+ distal pulses.  Cervical Exam: Dilatation 6 cm   Effacement 90%   Station -2  (changed from 3.5/80/-2 in triage) Presentation: cephalic FHT:  Baseline rate 140 bpm   Variability moderate  Accelerations present   Decelerations none Contractions: Every 1-4 mins   Pertinent Labs/Studies:   Results for orders placed or performed during the hospital encounter of 11/26/18 (from the past 24 hour(s))  CBC     Status: Abnormal   Collection Time: 11/26/18  7:24 PM  Result Value Ref Range   WBC 13.8 (H) 4.0 - 10.5 K/uL   RBC 4.55 3.87 - 5.11 MIL/uL   Hemoglobin 8.9 (L) 12.0 - 15.0 g/dL   HCT 40.930.2 (L) 81.136.0 - 91.446.0 %   MCV 66.4 (L) 80.0 - 100.0 fL   MCH 19.6 (L) 26.0 - 34.0 pg   MCHC 29.5 (L) 30.0 - 36.0 g/dL   RDW 78.218.1 (H) 95.611.5 - 21.315.5 %   Platelets 341 150 - 400 K/uL   nRBC 0.1 0.0 - 0.2 %    Assessment : [facility] Lajean SilviusBateman is a 23 y.o. G2P1001 at 7031w4d being admitted for labor. Moderate anemia of pregnancy present.   Plan: Labor: Expectant management. Augmentation as ordered as per protocol. Has recently received an epidural.  FWB: Reassuring fetal heart tracing.  GBS negative. Delivery  plan: Hopeful for vaginal delivery. To plan for iron infusion postpartum unless transfusion warranted by excessive blood loss.     Hildred Laserherry, Floree Zuniga, MD [facility]

## 2018-11-26 NOTE — Progress Notes (Signed)
Intrapartum Progress Note  S: Patient notes that she thinks she may have ruptured her membranes.   O: Blood pressure 120/68, pulse 84, temperature 98.1 F (36.7 C), temperature source Oral, resp. rate 15, height 5\' 2"  (1.575 m), weight 70.3 kg, last menstrual period 02/20/2018, SpO2 100 %. Gen App: NAD, comfortable Abdomen: soft, gravid FHT: baseline 140 bpm.  Accels present.  Decels absent. moderate in degree variability.   Tocometer: contractions q 1-3 minutes Cervix: 8/90/+1/BBOW with bloody show Extremities: Nontender, no edema.  Pitocin: None  Labs:  Remaining admission labs pending.   Assessment:  1: SIUP at [redacted]w[redacted]d 2. Active labor  Plan:  1. AROM'd with moderate amount of clear fluid.  2. Anticipate vaginal delivery soon.    Hildred Laser, MD 11/26/2018 9:18 PM

## 2018-11-26 NOTE — Anesthesia Preprocedure Evaluation (Signed)
Anesthesia Evaluation  Patient identified by MRN, date of birth, ID band Patient awake    Reviewed: Allergy & Precautions, H&P , NPO status , Patient's Chart, lab work & pertinent test results  Airway Mallampati: II  TM Distance: >3 FB     Dental  (+) Teeth Intact   Pulmonary neg pulmonary ROS,           Cardiovascular Exercise Tolerance: Good (-) hypertensionnegative cardio ROS       Neuro/Psych  Headaches, PSYCHIATRIC DISORDERS Depression    GI/Hepatic negative GI ROS, Neg liver ROS,   Endo/Other  negative endocrine ROS  Renal/GU negative Renal ROS  negative genitourinary   Musculoskeletal   Abdominal   Peds  Hematology negative hematology ROS (+)   Anesthesia Other Findings Past Medical History: No date: Abnormal breast exam     Comment:  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 No date: Allergic rhinitis No date: Anxiety and depression No date: Headache     Comment:  migraine- w/o aura No date: Hematuria  Past Surgical History: No date: NO PAST SURGERIES No date: wisdom teeth pulled  BMI    Body Mass Index:  28.35 kg/m      Reproductive/Obstetrics (+) Pregnancy                             Anesthesia Physical Anesthesia Plan  ASA: II  Anesthesia Plan: Epidural   Post-op Pain Management:    Induction:   PONV Risk Score and Plan:   Airway Management Planned:   Additional Equipment:   Intra-op Plan:   Post-operative Plan:   Informed Consent: I have reviewed the patients History and Physical, chart, labs and discussed the procedure including the risks, benefits and alternatives for the proposed anesthesia with the patient or authorized representative who has indicated his/her understanding and acceptance.       Plan Discussed with: Anesthesiologist  Anesthesia Plan Comments:         Anesthesia Quick  Evaluation

## 2018-11-26 NOTE — Anesthesia Procedure Notes (Addendum)
Epidural Patient location during procedure: OB Start time: 11/26/2018 8:16 PM End time: 11/26/2018 8:30 PM  Staffing Anesthesiologist: Jovita Gamma, MD Performed: anesthesiologist   Preanesthetic Checklist Completed: patient identified, site marked, surgical consent, pre-op evaluation, timeout performed, IV checked, risks and benefits discussed and monitors and equipment checked  Epidural Patient position: sitting Prep: ChloraPrep Patient monitoring: heart rate, continuous pulse ox and blood pressure Approach: midline Location: L4-L5 Injection technique: LOR saline  Needle:  Needle type: Tuohy  Needle gauge: 18 G Needle length: 9 cm and 9 Needle insertion depth: 6 cm Catheter type: closed end flexible Catheter size: 20 Guage Catheter at skin depth: 10 cm Test dose: negative and Other  Assessment Events: blood not aspirated, injection not painful, no injection resistance, negative IV test and no paresthesia  Additional Notes   Patient tolerated the insertion well without complications.Reason for block:procedure for pain

## 2018-11-27 LAB — CBC
HCT: 27.9 % — ABNORMAL LOW (ref 36.0–46.0)
Hemoglobin: 8 g/dL — ABNORMAL LOW (ref 12.0–15.0)
MCH: 19.4 pg — ABNORMAL LOW (ref 26.0–34.0)
MCHC: 28.7 g/dL — ABNORMAL LOW (ref 30.0–36.0)
MCV: 67.6 fL — ABNORMAL LOW (ref 80.0–100.0)
Platelets: 269 10*3/uL (ref 150–400)
RBC: 4.13 MIL/uL (ref 3.87–5.11)
RDW: 18 % — ABNORMAL HIGH (ref 11.5–15.5)
WBC: 17.1 10*3/uL — ABNORMAL HIGH (ref 4.0–10.5)
nRBC: 0 % (ref 0.0–0.2)

## 2018-11-27 MED ORDER — BENZOCAINE-MENTHOL 20-0.5 % EX AERO: 1.0000 "application " | INHALATION_SPRAY | CUTANEOUS | Status: DC | PRN

## 2018-11-27 MED ORDER — WITCH HAZEL-GLYCERIN EX PADS: 1.0000 "application " | MEDICATED_PAD | CUTANEOUS | Status: DC | PRN

## 2018-11-27 MED ORDER — SODIUM CHLORIDE 0.9 % IV SOLN
510.0000 mg | Freq: Once | INTRAVENOUS | Status: AC
  Administered 2018-11-27: 510 mg via INTRAVENOUS
  Filled 2018-11-27: qty 17

## 2018-11-27 MED ORDER — SIMETHICONE 80 MG PO CHEW: 80.0000 mg | CHEWABLE_TABLET | ORAL | Status: DC | PRN

## 2018-11-27 MED ORDER — TETANUS-DIPHTH-ACELL PERTUSSIS 5-2.5-18.5 LF-MCG/0.5 IM SUSP: 0.5000 mL | Freq: Once | INTRAMUSCULAR | Status: DC

## 2018-11-27 MED ORDER — SENNOSIDES-DOCUSATE SODIUM 8.6-50 MG PO TABS
2.0000 | ORAL_TABLET | ORAL | Status: DC
  Administered 2018-11-27 – 2018-11-28 (×2): 2 via ORAL
  Filled 2018-11-27 (×2): qty 2

## 2018-11-27 MED ORDER — ONDANSETRON HCL 4 MG/2ML IJ SOLN: 4.0000 mg | INTRAMUSCULAR | Status: DC | PRN

## 2018-11-27 MED ORDER — DIBUCAINE (PERIANAL) 1 % EX OINT: 1.0000 "application " | TOPICAL_OINTMENT | CUTANEOUS | Status: DC | PRN

## 2018-11-27 MED ORDER — COCONUT OIL OIL: 1.0000 "application " | TOPICAL_OIL | Status: DC | PRN

## 2018-11-27 MED ORDER — DIPHENHYDRAMINE HCL 25 MG PO CAPS
25.0000 mg | ORAL_CAPSULE | Freq: Four times a day (QID) | ORAL | Status: DC | PRN
  Administered 2018-11-27: 25 mg via ORAL
  Filled 2018-11-27: qty 1

## 2018-11-27 MED ORDER — ONDANSETRON HCL 4 MG PO TABS: 4.0000 mg | ORAL_TABLET | ORAL | Status: DC | PRN

## 2018-11-27 MED ORDER — ACETAMINOPHEN 325 MG PO TABS: 650.0000 mg | ORAL_TABLET | ORAL | Status: DC | PRN

## 2018-11-27 MED ORDER — ZOLPIDEM TARTRATE 5 MG PO TABS: 5.0000 mg | ORAL_TABLET | Freq: Every evening | ORAL | Status: DC | PRN

## 2018-11-27 MED ORDER — PRENATAL MULTIVITAMIN CH
1.0000 | ORAL_TABLET | Freq: Every day | ORAL | Status: DC
  Administered 2018-11-27: 12:00:00 1 via ORAL
  Filled 2018-11-27: qty 1

## 2018-11-27 MED ORDER — IBUPROFEN 800 MG PO TABS
800.0000 mg | ORAL_TABLET | Freq: Four times a day (QID) | ORAL | Status: DC
  Administered 2018-11-27 – 2018-11-28 (×6): 800 mg via ORAL
  Filled 2018-11-27 (×6): qty 1

## 2018-11-27 NOTE — Progress Notes (Addendum)
Post Partum Day # 1, s/p SVD  Subjective: no complaints, up ad lib, voiding and tolerating PO.  Patient had an allergic reaction overnight to IV iron infusion after 2 minutes into the infusion. Infusion was discontinued and Benadryl was given. Patient has had no further problems since then.   Objective: Temp:  [98.1 F (36.7 C)-99.1 F (37.3 C)] 98.5 F (36.9 C) (04/24 0801) Pulse Rate:  [72-101] 85 (04/24 0801) Resp:  [15-20] 20 (04/24 0801) BP: (108-134)/(65-88) 114/72 (04/24 0801) SpO2:  [98 %-100 %] 98 % (04/24 0801) Weight:  [70.3 kg] 70.3 kg (04/23 1724)  Physical Exam:  General: alert and no distress  Lungs: clear to auscultation bilaterally Breasts: normal appearance, no masses or tenderness Heart: regular rate and rhythm, S1, S2 normal, no murmur, click, rub or gallop Abdomen: soft, non-tender; bowel sounds normal; no masses,  no organomegaly Pelvis: Lochia: appropriate, Uterine Fundus: firm Extremities: DVT Evaluation: No evidence of DVT seen on physical exam. Negative Homan's sign. No cords or calf tenderness. No significant calf/ankle edema.   Recent Labs    11/26/18 1924 11/27/18 0553  HGB 8.9* 8.0*  HCT 30.2* 27.9*    Assessment/Plan: Doing well postpartum Breastfeeding, Lactation consult as needed Circumcision prior to discharge Contraception: Nexplanon Anemia of pregnancy, will treat with PO supplementation postpartum due to reaction to IV iron.  Plan for discharge tomorrow.    LOS: 1 day   Hildred Laser, MD [facility] 11/27/2018 9:47 AM

## 2018-11-27 NOTE — Anesthesia Postprocedure Evaluation (Signed)
Anesthesia Post Note  Patient: [facility]  Procedure(s) Performed: AN AD HOC LABOR EPIDURAL  Patient location during evaluation: Mother Baby Anesthesia Type: Epidural Level of consciousness: awake and alert Pain management: pain level controlled Vital Signs Assessment: post-procedure vital signs reviewed and stable Respiratory status: spontaneous breathing, nonlabored ventilation and respiratory function stable Cardiovascular status: stable Postop Assessment: no headache, no backache and epidural receding Anesthetic complications: no     Last Vitals:  Vitals:   11/27/18 0232 11/27/18 0801  BP: 134/88 114/72  Pulse: 95 85  Resp: 18 20  Temp: 37.3 C 36.9 C  SpO2: 100% 98%    Last Pain:  Vitals:   11/27/18 0801  TempSrc: Oral  PainSc:                  Rica Mast

## 2018-11-27 NOTE — Progress Notes (Signed)
Pt had immediate allergic reaction to IV iron infusion. ~5 mins after starting and a couple minutes after iron hit the vein pt skin became very red and pt began coughing and stated "I don't feel right. Something isn't right." also stated her lips felt "tingly" infusion immediately stopped and pt disconnected from IV tubing, SL'd, excess med pulled from SL then flushed, Benadryl given po, MD notified. No new orders received at this time. After a few minutes of infusion being stopped, pt's color returned to normal, coughing and tingling improved. VS stable during event. Pt never had trouble breathing at any point. RN remained with pt until pt condition returned to baseline. Will continue to assess.

## 2018-11-28 LAB — RPR: RPR Ser Ql: NONREACTIVE

## 2018-11-28 NOTE — Progress Notes (Signed)
Discharge instructions given. Patient verbalizes understanding of teaching. Patient discharged home via wheelchair at 1230. 

## 2018-11-28 NOTE — Discharge Summary (Signed)
                              Discharge Summary  Date of Admission: 11/26/2018  Date of Discharge: 11/28/2018  Admitting Diagnosis: Onset of Labor at [redacted]w[redacted]d  Mode of Delivery: normal spontaneous vaginal delivery                 Discharge Diagnosis: Anemia   Intrapartum Procedures: Atificial rupture of membranes and epidural   Post partum procedures:   Complications: none                      Discharge Day SOAP Note:  Progress Note - Vaginal Delivery  [facility] Marcia Hodges is a 23 y.o. G2P2002 now PP day 2 s/p Vaginal, Spontaneous . Delivery was uncomplicated  Subjective  The patient has the following complaints: has no unusual complaints  Pain is controlled with current medications.   Patient is urinating without difficulty.  She is ambulating well.  She is completely asymptomatic despite her low hemoglobin.  Objective  Vital signs: BP 114/81 (BP Location: Right Arm)   Pulse 70   Temp 98.4 F (36.9 C) (Oral)   Resp 18   Ht 5\' 2"  (1.575 m)   Wt 70.3 kg   LMP 02/20/2018 (Exact Date)   SpO2 100%   Breastfeeding Unknown   BMI 28.35 kg/m   Physical Exam: Gen: NAD Fundus Fundal Tone: Firm  Lochia Amount: Small  Perineum Appearance: Intact     Data Review Labs: CBC Latest Ref Rng & Units 11/27/2018 11/26/2018 10/05/2018  WBC 4.0 - 10.5 K/uL 17.1(H) 13.8(H) 10.4  Hemoglobin 12.0 - 15.0 g/dL 8.0(L) 8.9(L) 9.2(L)  Hematocrit 36.0 - 46.0 % 27.9(L) 30.2(L) 30.0(L)  Platelets 150 - 400 K/uL 269 341 284   A POS  Assessment/Plan  Active Problems:   Indication for care or intervention related to labor and delivery   Labor and delivery indication for care or intervention    Plan for discharge today.   Discharge Instructions: Per After Visit Summary. Activity: Advance as tolerated. Pelvic rest for 6 weeks.  Also refer to After Visit Summary Diet: Regular Medications: Allergies as of 11/28/2018      Reactions   Feraheme [ferumoxytol] Rash      Medication List    STOP taking these medications   prenatal multivitamin Tabs tablet      Outpatient follow up:  Follow-up Information    Hildred Laser, MD Follow up in 6 week(s).   Specialties:  Obstetrics and Gynecology, Radiology Contact information: [address] [place] [address] [phone]          Postpartum contraception: Will discuss at first office visit post-partum  Discharged Condition: good  Discharged to: home  Newborn Data: Disposition:home with mother  Apgars: APGAR (1 MIN): 9   APGAR (5 MINS): 9   APGAR (10 MINS):    Baby Feeding: Breast  Patient advised to take over-the-counter iron twice a day until her 6-week follow-up.    Elonda Husky, M.D. 11/28/2018 10:08 AM

## 2018-12-01 ENCOUNTER — Telehealth: Payer: Self-pay | Admitting: Obstetrics and Gynecology

## 2018-12-01 NOTE — Telephone Encounter (Signed)
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 Please advise.

## 2018-12-01 NOTE — Telephone Encounter (Signed)
Patient is feeling like she is worthless and will burst out crying for no reason. I have scheduled her for a visit with Dr. Valentino Saxon.

## 2018-12-02 ENCOUNTER — Encounter: Payer: Self-pay | Admitting: Obstetrics and Gynecology

## 2018-12-02 ENCOUNTER — Other Ambulatory Visit: Payer: Self-pay

## 2018-12-02 ENCOUNTER — Ambulatory Visit (INDEPENDENT_AMBULATORY_CARE_PROVIDER_SITE_OTHER): Payer: Medicaid Other | Admitting: Obstetrics and Gynecology

## 2018-12-02 VITALS — BP 161/84 | HR 55 | Ht 62.0 in | Wt 140.8 lb

## 2018-12-02 DIAGNOSIS — F53 Postpartum depression: Secondary | ICD-10-CM | POA: Diagnosis not present

## 2018-12-02 DIAGNOSIS — O99345 Other mental disorders complicating the puerperium: Secondary | ICD-10-CM | POA: Diagnosis not present

## 2018-12-02 DIAGNOSIS — Z8659 Personal history of other mental and behavioral disorders: Secondary | ICD-10-CM | POA: Diagnosis not present

## 2018-12-02 DIAGNOSIS — O165 Unspecified maternal hypertension, complicating the puerperium: Secondary | ICD-10-CM

## 2018-12-02 MED ORDER — SERTRALINE HCL 50 MG PO TABS: 50.0000 mg | ORAL_TABLET | Freq: Every day | ORAL | 6 refills | Status: DC

## 2018-12-02 MED ORDER — LABETALOL HCL 200 MG PO TABS: 200.0000 mg | ORAL_TABLET | Freq: Two times a day (BID) | ORAL | 3 refills | Status: DC

## 2018-12-02 NOTE — Patient Instructions (Signed)
Postpartum Hypertension Postpartum hypertension is high blood pressure that remains higher than normal after childbirth. You may not realize that you have postpartum hypertension if your blood pressure is not being checked regularly. In most cases, postpartum hypertension will go away on its own, usually within a week of delivery. However, for some women, medical treatment is required to prevent serious complications, such as seizures or stroke. What are the causes? This condition may be caused by one or more of the following:  Hypertension that existed before pregnancy (chronic hypertension).  Hypertension that comes on as a result of pregnancy (gestational hypertension).  Hypertensive disorders during pregnancy (preeclampsia) or seizures in women who have high blood pressure during pregnancy (eclampsia).  A condition in which the liver, platelets, and red blood cells are damaged during pregnancy (HELLP syndrome).  A condition in which the thyroid produces too much hormones (hyperthyroidism).  Other rare problems of the nerves (neurological disorders) or blood disorders. In some cases, the cause may not be known. What increases the risk? The following factors may make you more likely to develop this condition:  Chronic hypertension. In some cases, this may not have been diagnosed before pregnancy.  Obesity.  Type 2 diabetes.  Kidney disease.  History of preeclampsia or eclampsia.  Other medical conditions that change the level of hormones in the body (hormonal imbalance). What are the signs or symptoms? As with all types of hypertension, postpartum hypertension may not have any symptoms. Depending on how high your blood pressure is, you may experience:  Headaches. These may be mild, moderate, or severe. They may also be steady, constant, or sudden in onset (thunderclap headache).  Changes in your ability to see (visual changes).  Dizziness.  Shortness of breath.  Swelling  of your hands, feet, lower legs, or face. In some cases, you may have swelling in more than one of these locations.  Heart palpitations or a racing heartbeat.  Difficulty breathing while lying down.  Decrease in the amount of urine that you pass. Other rare signs and symptoms may include:  Sweating more than usual. This lasts longer than a few days after delivery.  Chest pain.  Sudden dizziness when you get up from sitting or lying down.  Seizures.  Nausea or vomiting.  Abdominal pain. How is this diagnosed? This condition may be diagnosed based on the results of a physical exam, blood pressure measurements, and blood and urine tests. You may also have other tests, such as a CT scan or an MRI, to check for other problems of postpartum hypertension. How is this treated? If blood pressure is high enough to require treatment, your options may include:  Medicines to reduce blood pressure (antihypertensives). Tell your health care provider if you are breastfeeding or if you plan to breastfeed. There are many antihypertensive medicines that are safe to take while breastfeeding.  Stopping medicines that may be causing hypertension.  Treating medical conditions that are causing hypertension.  Treating the complications of hypertension, such as seizures, stroke, or kidney problems. Your health care provider will also continue to monitor your blood pressure closely until it is within a safe range for you. Follow these instructions at home:  Take over-the-counter and prescription medicines only as told by your health care provider.  Return to your normal activities as told by your health care provider. Ask your health care provider what activities are safe for you.  Do not use any products that contain nicotine or tobacco, such as cigarettes and e-cigarettes. If   you need help quitting, ask your health care provider.  Keep all follow-up visits as told by your health care provider. This  is important. Contact a health care provider if:  Your symptoms get worse.  You have new symptoms, such as: ? A headache that does not get better. ? Dizziness. ? Visual changes. Get help right away if:  You suddenly develop swelling in your hands, ankles, or face.  You have sudden, rapid weight gain.  You develop difficulty breathing, chest pain, racing heartbeat, or heart palpitations.  You develop severe pain in your abdomen.  You have any symptoms of a stroke. "BE FAST" is an easy way to remember the main warning signs of a stroke: ? B - Balance. Signs are dizziness, sudden trouble walking, or loss of balance. ? E - Eyes. Signs are trouble seeing or a sudden change in vision. ? F - Face. Signs are sudden weakness or numbness of the face, or the face or eyelid drooping on one side. ? A - Arms. Signs are weakness or numbness in an arm. This happens suddenly and usually on one side of the body. ? S - Speech. Signs are sudden trouble speaking, slurred speech, or trouble understanding what people say. ? T - Time. Time to call emergency services. Write down what time symptoms started.  You have other signs of a stroke, such as: ? A sudden, severe headache with no known cause. ? Nausea or vomiting. ? Seizure. These symptoms may represent a serious problem that is an emergency. Do not wait to see if the symptoms will go away. Get medical help right away. Call your local emergency services (911 in the [place] Do not drive yourself to the hospital. Summary  Postpartum hypertension is high blood pressure that remains higher than normal after childbirth.  In most cases, postpartum hypertension will go away on its own, usually within a week of delivery.  For some women, medical treatment is required to prevent serious complications, such as seizures or stroke. This information is not intended to replace advice given to you by your health care provider. Make sure you discuss any questions  you have with your health care provider. Document Released: 03/25/2014 Document Revised: 05/12/2017 Document Reviewed: 05/12/2017 Elsevier Interactive Patient Education  2019 Elsevier Inc.    Sertraline tablets What is this medicine? SERTRALINE (SER tra leen) is used to treat depression. It may also be used to treat obsessive compulsive disorder, panic disorder, post-trauma stress, premenstrual dysphoric disorder (PMDD) or social anxiety. This medicine may be used for other purposes; ask your health care provider or pharmacist if you have questions. COMMON BRAND NAME(S): Zoloft What should I tell my health care provider before I take this medicine? They need to know if you have any of these conditions: -bleeding disorders -bipolar disorder or a family history of bipolar disorder -glaucoma -heart disease -high blood pressure -history of irregular heartbeat -history of low levels of calcium, magnesium, or potassium in the blood -if you often drink alcohol -liver disease -receiving electroconvulsive therapy -seizures -suicidal thoughts, plans, or attempt; a previous suicide attempt by you or a family member -take medicines that treat or prevent blood clots -thyroid disease -an unusual or allergic reaction to sertraline, other medicines, foods, dyes, or preservatives -pregnant or trying to get pregnant -breast-feeding How should I use this medicine? Take this medicine by mouth with a glass of water. Follow the directions on the prescription label. You can take it with or without food. Take your  medicine at regular intervals. Do not take your medicine more often than directed. Do not stop taking this medicine suddenly except upon the advice of your doctor. Stopping this medicine too quickly may cause serious side effects or your condition may worsen. A special MedGuide will be given to you by the pharmacist with each prescription and refill. Be sure to read this information carefully each  time. Talk to your pediatrician regarding the use of this medicine in children. While this drug may be prescribed for children as young as 7 years for selected conditions, precautions do apply. Overdosage: If you think you have taken too much of this medicine contact a poison control center or emergency room at once. NOTE: This medicine is only for you. Do not share this medicine with others. What if I miss a dose? If you miss a dose, take it as soon as you can. If it is almost time for your next dose, take only that dose. Do not take double or extra doses. What may interact with this medicine? Do not take this medicine with any of the following medications: -cisapride -dofetilide -dronedarone -linezolid -MAOIs like Carbex, Eldepryl, Marplan, Nardil, and Parnate -methylene blue (injected into a vein) -pimozide -thioridazine This medicine may also interact with the following medications: -alcohol -amphetamines -aspirin and aspirin-like medicines -certain medicines for depression, anxiety, or psychotic disturbances -certain medicines for fungal infections like ketoconazole, fluconazole, posaconazole, and itraconazole -certain medicines for irregular heart beat like flecainide, quinidine, propafenone -certain medicines for migraine headaches like almotriptan, eletriptan, frovatriptan, naratriptan, rizatriptan, sumatriptan, zolmitriptan -certain medicines for sleep -certain medicines for seizures like carbamazepine, valproic acid, phenytoin -certain medicines that treat or prevent blood clots like warfarin, enoxaparin, dalteparin -cimetidine -digoxin -diuretics -fentanyl -isoniazid -lithium -NSAIDs, medicines for pain and inflammation, like ibuprofen or naproxen -other medicines that prolong the QT interval (cause an abnormal heart rhythm) -rasagiline -safinamide -supplements like St. John's wort, kava kava, valerian -tolbutamide -tramadol -tryptophan This list may not describe  all possible interactions. Give your health care provider a list of all the medicines, herbs, non-prescription drugs, or dietary supplements you use. Also tell them if you smoke, drink alcohol, or use illegal drugs. Some items may interact with your medicine. What should I watch for while using this medicine? Tell your doctor if your symptoms do not get better or if they get worse. Visit your doctor or health care professional for regular checks on your progress. Because it may take several weeks to see the full effects of this medicine, it is important to continue your treatment as prescribed by your doctor. Patients and their families should watch out for new or worsening thoughts of suicide or depression. Also watch out for sudden changes in feelings such as feeling anxious, agitated, panicky, irritable, hostile, aggressive, impulsive, severely restless, overly excited and hyperactive, or not being able to sleep. If this happens, especially at the beginning of treatment or after a change in dose, call your health care professional. Bonita Quin may get drowsy or dizzy. Do not drive, use machinery, or do anything that needs mental alertness until you know how this medicine affects you. Do not stand or sit up quickly, especially if you are an older patient. This reduces the risk of dizzy or fainting spells. Alcohol may interfere with the effect of this medicine. Avoid alcoholic drinks. Your mouth may get dry. Chewing sugarless gum or sucking hard candy, and drinking plenty of water may help. Contact your doctor if the problem does not go away or  is severe. What side effects may I notice from receiving this medicine? Side effects that you should report to your doctor or health care professional as soon as possible: -allergic reactions like skin rash, itching or hives, swelling of the face, lips, or tongue -anxious -black, tarry stools -changes in vision -confusion -elevated mood, decreased need for sleep, racing  thoughts, impulsive behavior -eye pain -fast, irregular heartbeat -feeling faint or lightheaded, falls -feeling agitated, angry, or irritable -hallucination, loss of contact with reality -loss of balance or coordination -loss of memory -painful or prolonged erections -restlessness, pacing, inability to keep still -seizures -stiff muscles -suicidal thoughts or other mood changes -trouble sleeping -unusual bleeding or bruising -unusually weak or tired -vomiting Side effects that usually do not require medical attention (report to your doctor or health care professional if they continue or are bothersome): -change in appetite or weight -change in sex drive or performance -diarrhea -increased sweating -indigestion, nausea -tremors This list may not describe all possible side effects. Call your doctor for medical advice about side effects. You may report side effects to FDA at 1-800-FDA-1088. Where should I keep my medicine? Keep out of the reach of children. Store at room temperature between 15 and 30 degrees C (59 and 86 degrees F). Throw away any unused medicine after the expiration date. NOTE: This sheet is a summary. It may not cover all possible information. If you have questions about this medicine, talk to your doctor, pharmacist, or health care provider.  2019 Elsevier/Gold Standard (2016-07-26 14:17:49)    Perinatal Depression When a woman feels excessive sadness, anger, or anxiety during pregnancy or during the first 12 months after she gives birth, she has a condition called perinatal depression. Depression can interfere with work, school, relationships, and other everyday activities. If it is not managed properly, it can also cause problems in the mother and her baby. Sometimes, perinatal depression is left untreated because symptoms are thought to be normal mood swings during and right after pregnancy. If you have symptoms of depression, it is important to talk with your  health care provider. What are the causes? The exact cause of this condition is not known. Hormonal changes during and after pregnancy may play a role in causing perinatal depression. What increases the risk? You are more likely to develop this condition if:  You have a personal or family history of depression, anxiety, or mood disorders.  You experience a stressful life event during pregnancy, such as the death of a loved one.  You have a lot of regular life stress.  You do not have support from family members or loved ones, or you are in an abusive relationship. What are the signs or symptoms? Symptoms of this condition include:  Feeling sad or hopeless.  Feelings of guilt.  Feeling irritable or overwhelmed.  Changes in your appetite.  Lack of energy or motivation.  Sleep problems.  Difficulty concentrating or completing tasks.  Loss of interest in hobbies or relationships.  Headaches or stomach problems that do not go away. How is this diagnosed? This condition is diagnosed based on a physical exam and mental evaluation. In some cases, your health care provider may use a depression screening tool. These tools include a list of questions that can help a health care provider diagnose depression. Your health care provider may refer you to a mental health expert who specializes in depression. How is this treated? This condition may be treated with:  Medicines. Your health care provider will only  give you medicines that have been proven safe for pregnancy and breastfeeding.  Talk therapy with a mental health professional to help change your patterns of thinking (cognitive behavioral therapy).  Support groups.  Brain stimulation or light therapies.  Stress reduction therapies, such as mindfulness. Follow these instructions at home: Lifestyle  Do not use any products that contain nicotine or tobacco, such as cigarettes and e-cigarettes. If you need help quitting, ask  your health care provider.  Do not use alcohol when you are pregnant. After your baby is born, limit alcohol intake to no more than 1 drink a day. One drink equals 12 oz of beer, 5 oz of wine, or 1 oz of hard liquor.  Consider joining a support group for new mothers. Ask your health care provider for recommendations.  Take good care of yourself. Make sure you: ? Get plenty of sleep. If you are having trouble sleeping, talk with your health care provider. ? Eat a healthy diet. This includes plenty of fruits and vegetables, whole grains, and lean proteins. ? Exercise regularly, as told by your health care provider. Ask your health care provider what exercises are safe for you. General instructions  Take over-the-counter and prescription medicines only as told by your health care provider.  Talk with your partner or family members about your feelings during pregnancy. Share any concerns or anxieties that you may have.  Ask for help with tasks or chores when you need it. Ask friends and family members to provide meals, watch your children, or help with cleaning.  Keep all follow-up visits as told by your health care provider. This is important. Contact a health care provider if:  You (or people close to you) notice that you have any symptoms of depression.  You have depression and your symptoms get worse.  You experience side effects from medicines, such as nausea or sleep problems. Get help right away if:  You feel like hurting yourself, your baby, or someone else. If you ever feel like you may hurt yourself or others, or have thoughts about taking your own life, get help right away. You can go to your nearest emergency department or call:  Your local emergency services (911 in the [place]  A suicide crisis helpline, such as the National Suicide Prevention Lifeline at 904-556-76961-(671)476-9329. This is open 24 hours a day. Summary  Perinatal depression is when a woman feels excessive sadness,  anger, or anxiety during pregnancy or during the first 12 months after she gives birth.  If perinatal depression is not treated, it can lead to health problems for the mother and her baby.  This condition is treated with medicines, talk therapy, stress reduction therapies, or a combination of two or more treatments.  Talk with your partner or family members about your feelings. Do not be afraid to ask for help. This information is not intended to replace advice given to you by your health care provider. Make sure you discuss any questions you have with your health care provider. Document Released: 09/18/2016 Document Revised: 09/18/2016 Document Reviewed: 09/18/2016 Elsevier Interactive Patient Education  [employer]2019 [employer] Inc.

## 2018-12-02 NOTE — Progress Notes (Signed)
    GYNECOLOGY PROGRESS NOTE  Subjective:    Patient ID: Marcia Hodges, female    DOB: 1996-01-28, 23 y.o.   MRN: [id number]  HPI  Patient is a 23 y.o. G18P2002 female who presents 1 week postpartum for complaints of symptoms of postpartum depression.  Patient notes that approximately 2 days after being discharged from the hospital, she began to feel very tearful and emotional.  She noted that she has overwhelming feelings of helplessness when her baby cries frequently and guilt over having another child as she feels that her toddler will resent her for not giving him enough love and affection.  She notes that there are some days where she just does not desire to get out of bed to take care of herself or her children.  She does have a history of depression in the past, also with postpartum depression after the birth of her first child.  Was on Zoloft prior to this recent pregnancy but discontinued after discovery of the pregnancy. EPDS score today is 18.   The following portions of the patient's history were reviewed and updated as appropriate: allergies, current medications, past family history, past medical history, past social history, past surgical history and problem list.  Review of Systems Pertinent items noted in HPI and remainder of comprehensive ROS otherwise negative.   Objective:   Blood pressure (!) 161/98, pulse (!) 58, height 5\' 2"  (1.575 m), weight 140 lb 12.8 oz (63.9 kg), not currently breastfeeding.  Repeat BP 161/84.  General appearance: alert and no acute physical distress.  Very tearful.  Neurologic: Grossly normal  Psychological: tearful, depressed mood but normal affect. Speech appropriate. Denies SI/HI.    Assessment:   Postpartum depression History of depression Postpartum hypertension  Plan:   1. Postpartum depression with a prior history of depression - Discussion had with patient today regarding symptoms.  Discussed postpartum blues vs postpartum  depression.  She is likely suffering from postpartum depression as she has experienced this in the past and has a prior history of depression outside of pregnancy.  Currently no SI/HI.  Patient notes that she has good support at home.  Advised on getting as much rest as possible and utilizing her support system as sleep deprivation can also intensify symptoms. Discussed options for management, including counseling, medications, or both.  Patient desires to resume her medication (Zoloft), declines therapy for now.  Will  Zoloft 50 mg /refer to counseling. To follow up in 4 weeks with postpartum visit.  2. Postpartum HTN - patient notes that her BPs prior to discharge were a little elevated.  Reviewed chart, no significantly elevated BPs noted, however today's BP and repeat after visit were still elevated, although patient was no longer emotional at time of second reading. No other symptoms of headache, blurred vision, RUQ pain, or swelling that would suggest pre-eclampsia. Recommend initiation of Labetalol for management of postpartum HTN (see orders).  Will recheck blood pressures again on Monday.     A total of 15 minutes were spent face-to-face with the patient during this encounter and over half of that time dealt with counseling and coordination of care.   Hildred Laser, MD [facility]

## 2018-12-02 NOTE — Progress Notes (Signed)
Pt is present today for postpartum depression. Pt stated that she was doing okay and just having a hard time and started crying. Pt's EPDS score is 18.

## 2018-12-04 ENCOUNTER — Telehealth: Payer: Self-pay

## 2018-12-04 NOTE — Telephone Encounter (Signed)
Coronavirus (COVID-19) Are you at risk?  Are you at risk for the Coronavirus (COVID-19)?  To be considered HIGH RISK for Coronavirus (COVID-19), you have to meet the following criteria:  . Traveled to [place], [place], [place], [place] or [place]; or in the [place] to [place], [place], [place], or [place]; and have fever, cough, and shortness of breath within the last 2 weeks of travel OR . Been in close contact with a person diagnosed with COVID-19 within the last 2 weeks and have fever, cough, and shortness of breath . IF YOU DO NOT MEET THESE CRITERIA, YOU ARE CONSIDERED LOW RISK FOR COVID-19.  What to do if you are HIGH RISK for COVID-19?  . If you are having a medical emergency, call 911. . Seek medical care right away. Before you go to a doctor's office, urgent care or emergency department, call ahead and tell them about your recent travel, contact with someone diagnosed with COVID-19, and your symptoms. You should receive instructions from your physician's office regarding next steps of care.  . When you arrive at healthcare provider, tell the healthcare staff immediately you have returned from visiting [place], [place], [place], [place] or [place]; or traveled in the [place] to [place], [place], [place], or [place]; in the last two weeks or you have been in close contact with a person diagnosed with COVID-19 in the last 2 weeks.   . Tell the health care staff about your symptoms: fever, cough and shortness of breath. . After you have been seen by a medical provider, you will be either: o Tested for (COVID-19) and discharged home on quarantine except to seek medical care if symptoms worsen, and asked to  - Stay home and avoid contact with others until you get your results (4-5 days)  - Avoid travel on public transportation if possible (such as bus, train, or airplane) or o Sent to the Emergency Department by EMS for evaluation, COVID-19 testing, and possible  admission depending on your condition and test results.  What to do if you are LOW RISK for COVID-19?  Reduce your risk of any infection by using the same precautions used for avoiding the common cold or flu:  . Wash your hands often with soap and warm water for at least 20 seconds.  If soap and water are not readily available, use an alcohol-based hand sanitizer with at least 60% alcohol.  . If coughing or sneezing, cover your mouth and nose by coughing or sneezing into the elbow areas of your shirt or coat, into a tissue or into your sleeve (not your hands). . Avoid shaking hands with others and consider head nods or verbal greetings only. . Avoid touching your eyes, nose, or mouth with unwashed hands.  . Avoid close contact with people who are sick. . Avoid places or events with large numbers of people in one location, like concerts or sporting events. . Carefully consider travel plans you have or are making. . If you are planning any travel outside or inside the [place], visit the CDC's Travelers' Health webpage for the latest health notices. . If you have some symptoms but not all symptoms, continue to monitor at home and seek medical attention if your symptoms worsen. . If you are having a medical emergency, call 911.   ADDITIONAL HEALTHCARE OPTIONS FOR PATIENTS  [place] [facility] / e-Visit: [email or web address]         [facility]: [phone]  [facility]: [phone]                   [facility]: [phone]   Prescreened. Neg .cm 

## 2018-12-07 ENCOUNTER — Ambulatory Visit (INDEPENDENT_AMBULATORY_CARE_PROVIDER_SITE_OTHER): Payer: Self-pay | Admitting: Obstetrics and Gynecology

## 2018-12-07 ENCOUNTER — Other Ambulatory Visit: Payer: Self-pay

## 2018-12-07 VITALS — BP 120/85 | HR 83 | Ht 62.0 in | Wt 134.1 lb

## 2018-12-07 DIAGNOSIS — O165 Unspecified maternal hypertension, complicating the puerperium: Secondary | ICD-10-CM

## 2018-12-07 DIAGNOSIS — Z0130 Encounter for examination of blood pressure without abnormal findings: Secondary | ICD-10-CM

## 2018-12-07 NOTE — Progress Notes (Signed)
Patient comes in today for a BP check. Patient's BP was taken in the left arm. BP was 120/85. Patient is tolerating medication.   Vitals:   12/07/18 1051  BP: 120/85  Pulse: 83

## 2018-12-30 ENCOUNTER — Encounter: Payer: Self-pay | Admitting: Obstetrics and Gynecology

## 2018-12-30 ENCOUNTER — Other Ambulatory Visit: Payer: Self-pay

## 2018-12-30 ENCOUNTER — Ambulatory Visit (INDEPENDENT_AMBULATORY_CARE_PROVIDER_SITE_OTHER): Payer: Medicaid Other | Admitting: Obstetrics and Gynecology

## 2018-12-30 DIAGNOSIS — O99345 Other mental disorders complicating the puerperium: Secondary | ICD-10-CM

## 2018-12-30 DIAGNOSIS — O165 Unspecified maternal hypertension, complicating the puerperium: Secondary | ICD-10-CM

## 2018-12-30 DIAGNOSIS — O9081 Anemia of the puerperium: Secondary | ICD-10-CM

## 2018-12-30 DIAGNOSIS — F53 Postpartum depression: Secondary | ICD-10-CM

## 2018-12-30 NOTE — Progress Notes (Signed)
PT is present today for her postpartum visit. Pt stated that she is not  breastfeeding and have had sexually intercourse recently, but used a condom. Pt stated that she would like to get the Nexplanon for birth control. EPDS= 1.  Pt stated that she is doing well no complaints.

## 2018-12-30 NOTE — Progress Notes (Signed)
   OBSTETRICS POSTPARTUM CLINIC PROGRESS NOTE  Subjective:     Marcia Hodges is a 23 y.o. [phone] female who presents for a postpartum visit. She is 6 weeks postpartum following a spontaneous vaginal delivery. I have fully reviewed the prenatal and intrapartum course. The delivery was at 40.4 gestational weeks.  Anesthesia: epidural. Postpartum course has been complicated by postpartum depression (started on Zoloft) and postpartum hypertension (started on Labetalol). Baby's course has been well. Baby is feeding by bottle - Marcia Hodges. Bleeding: patient has not resumed menses, with No LMP recorded.. Bowel function is normal. Bladder function is normal. Patient is sexually active (used condoms). Contraception method desired is none. Postpartum depression screening: negative (EPDS is 1, decreased from 18 at last visit).  The following portions of the patient's history were reviewed and updated as appropriate: allergies, current medications, past family history, past medical history, past social history, past surgical history and problem list.  Review of Systems Pertinent items noted in HPI and remainder of comprehensive ROS otherwise negative.   Objective:    BP 108/73   Pulse 75   Ht 5\' 2"  (1.575 m)   Wt 138 lb 4.8 oz (62.7 kg)   Breastfeeding No   BMI 25.30 kg/m   General:  alert and no distress   Breasts:  inspection negative, no nipple discharge or bleeding, no masses or nodularity palpable  Lungs: clear to auscultation bilaterally  Heart:  regular rate and rhythm, S1, S2 normal, no murmur, click, rub or gallop  Abdomen: soft, non-tender; bowel sounds normal; no masses,  no organomegaly.    Vulva:  normal  Vagina: normal vagina, no discharge, exudate, lesion, or erythema  Cervix:  no cervical motion tenderness and no lesions  Corpus: normal size, contour, position, consistency, mobility, non-tender  Adnexa:  normal adnexa and no mass, fullness, tenderness  Rectal Exam: Not  performed.         Labs:  Lab Results  Component Value Date   HGB 8.0 (L) 11/27/2018     Assessment:   Postpartum care following vaginal delivery Postpartum anemia Postpartum hypertension Postpartum depression  Plan:   1. Contraception: Nexplanon.  To return in 1 week for insertion. Advised on abstinence until that time.  2. Will check Hgb for h/o anemia.  3. Patient discontinued BP meds several days before today's visit. BPs normal. Does not need to resume.  4. Postpartum depression managed well on Zoloft. Advised to continue for approximately 4-6 months before attempting to wean.   Follow up in: 1 week or sooner as needed.    Hildred Laser, MD [facility]

## 2018-12-31 LAB — HEMOGLOBIN AND HEMATOCRIT, BLOOD
Hematocrit: 37.3 % (ref 34.0–46.6)
Hemoglobin: 10.9 g/dL — ABNORMAL LOW (ref 11.1–15.9)

## 2019-01-04 ENCOUNTER — Telehealth: Payer: Self-pay

## 2019-01-04 NOTE — Progress Notes (Signed)
Pt is present today for Nexplanon insertion. Pt's UPT- neg. Pt stated that she was doing well. Pt's is recently pregnant and has not started her regular cycles.

## 2019-01-04 NOTE — Patient Instructions (Signed)
Nexplanon Instructions After Insertion  Keep bandage clean and dry for 24 hours  May use ice/Tylenol/Ibuprofen for soreness or pain  If you develop fever, drainage or increased warmth from incision site-contact office immediately   

## 2019-01-04 NOTE — Telephone Encounter (Signed)
Pt prescreened no symptoms. Pt has face mask. Pt aware of no visitors policy and to do echeck in.   Coronavirus (COVID-19) Are you at risk?  Are you at risk for the Coronavirus (COVID-19)?  To be considered HIGH RISK for Coronavirus (COVID-19), you have to meet the following criteria:  . Traveled to [place], [place], [place], [place] or [place]; or in the [place] to [place], [place], [place], or [place]; and have fever, cough, and shortness of breath within the last 2 weeks of travel OR . Been in close contact with a person diagnosed with COVID-19 within the last 2 weeks and have fever, cough, and shortness of breath . IF YOU DO NOT MEET THESE CRITERIA, YOU ARE CONSIDERED LOW RISK FOR COVID-19.  What to do if you are HIGH RISK for COVID-19?  Marland Kitchen If you are having a medical emergency, call 911. . Seek medical care right away. Before you go to a doctor's office, urgent care or emergency department, call ahead and tell them about your recent travel, contact with someone diagnosed with COVID-19, and your symptoms. You should receive instructions from your physician's office regarding next steps of care.  . When you arrive at healthcare provider, tell the healthcare staff immediately you have returned from visiting [place], [place], [place], [place] or [place]; or traveled in the [place] to [place], [place], [place], or [place]; in the last two weeks or you have been in close contact with a person diagnosed with COVID-19 in the last 2 weeks.   . Tell the health care staff about your symptoms: fever, cough and shortness of breath. . After you have been seen by a medical provider, you will be either: o Tested for (COVID-19) and discharged home on quarantine except to seek medical care if symptoms worsen, and asked to  - Stay home and avoid contact with others until you get your results (4-5 days)  - Avoid travel on public transportation if possible (such as bus, train, or  airplane) or o Sent to the Emergency Department by EMS for evaluation, COVID-19 testing, and possible admission depending on your condition and test results.  What to do if you are LOW RISK for COVID-19?  Reduce your risk of any infection by using the same precautions used for avoiding the common cold or flu:  Marland Kitchen Wash your hands often with soap and warm water for at least 20 seconds.  If soap and water are not readily available, use an alcohol-based hand sanitizer with at least 60% alcohol.  . If coughing or sneezing, cover your mouth and nose by coughing or sneezing into the elbow areas of your shirt or coat, into a tissue or into your sleeve (not your hands). . Avoid shaking hands with others and consider head nods or verbal greetings only. . Avoid touching your eyes, nose, or mouth with unwashed hands.  . Avoid close contact with people who are sick. . Avoid places or events with large numbers of people in one location, like concerts or sporting events. . Carefully consider travel plans you have or are making. . If you are planning any travel outside or inside the [place], visit the CDC's Travelers' Health webpage for the latest health notices. . If you have some symptoms but not all symptoms, continue to monitor at home and seek medical attention if your symptoms worsen. . If you are having a medical emergency, call 911.   ADDITIONAL HEALTHCARE OPTIONS FOR PATIENTS  [facility] Tele[facility] / e-Visit: [email or web address]         [facility]: [facility]: [phone]                   [facility]: [phone]

## 2019-01-05 ENCOUNTER — Other Ambulatory Visit: Payer: Self-pay

## 2019-01-05 ENCOUNTER — Ambulatory Visit (INDEPENDENT_AMBULATORY_CARE_PROVIDER_SITE_OTHER): Payer: Medicaid Other | Admitting: Obstetrics and Gynecology

## 2019-01-05 ENCOUNTER — Encounter: Payer: Self-pay | Admitting: Obstetrics and Gynecology

## 2019-01-05 VITALS — BP 99/62 | HR 86 | Ht 62.0 in | Wt 136.9 lb

## 2019-01-05 DIAGNOSIS — Z30017 Encounter for initial prescription of implantable subdermal contraceptive: Secondary | ICD-10-CM | POA: Diagnosis not present

## 2019-01-05 DIAGNOSIS — Z3202 Encounter for pregnancy test, result negative: Secondary | ICD-10-CM

## 2019-01-05 LAB — POCT URINE PREGNANCY: Preg Test, Ur: NEGATIVE

## 2019-01-05 NOTE — Progress Notes (Signed)
     GYNECOLOGY OFFICE PROCEDURE NOTE  Marcia Hodges is a 23 y.o. [phone] here for Nexplanon insertion.  Last pap smear was on 04/10/2016 and was normal.  No other gynecologic concerns.   Nexplanon Insertion Procedure Patient identified, informed consent performed, consent signed.   Patient does understand that irregular bleeding is a very common side effect of this medication. She was advised to have backup contraception for one week after placement. Pregnancy test in clinic today was negative.  Appropriate time out taken.  Patient's left arm was prepped and draped in the usual sterile fashion. The ruler used to measure and mark insertion area.  Patient was prepped with alcohol swab and then injected with 3 ml of 1% lidocaine.  She was prepped with betadine, Nexplanon removed from packaging,  Device confirmed in needle, then inserted full length of needle and withdrawn per handbook instructions. Nexplanon was able to palpated in the patient's arm; patient palpated the insert herself. There was minimal blood loss.  Patient insertion site covered with guaze and a pressure bandage to reduce any bruising.  The patient tolerated the procedure well and was given post procedure instructions.   Exp: 03/31/2021 Lot #: [id number]   Hildred Laser, MD [facility]

## 2019-01-05 NOTE — Addendum Note (Signed)
Addended by: Fabian November on: 01/05/2019 10:08 AM   Modules accepted: Level of Service

## 2019-04-07 ENCOUNTER — Encounter: Payer: Medicaid Other | Admitting: Obstetrics and Gynecology

## 2019-05-24 ENCOUNTER — Encounter: Payer: Self-pay | Admitting: Emergency Medicine

## 2019-05-24 ENCOUNTER — Other Ambulatory Visit: Payer: Self-pay

## 2019-05-24 ENCOUNTER — Emergency Department: Payer: Medicaid Other

## 2019-05-24 ENCOUNTER — Emergency Department
Admission: EM | Admit: 2019-05-24 | Discharge: 2019-05-24 | Disposition: A | Discharge status: Home / Self Care | Payer: Medicaid Other | Attending: Emergency Medicine | Admitting: Emergency Medicine

## 2019-05-24 DIAGNOSIS — Y92013 Bedroom of single-family (private) house as the place of occurrence of the external cause: Secondary | ICD-10-CM | POA: Insufficient documentation

## 2019-05-24 DIAGNOSIS — W06XXXA Fall from bed, initial encounter: Secondary | ICD-10-CM | POA: Diagnosis not present

## 2019-05-24 DIAGNOSIS — S63501A Unspecified sprain of right wrist, initial encounter: Secondary | ICD-10-CM

## 2019-05-24 DIAGNOSIS — Z79899 Other long term (current) drug therapy: Secondary | ICD-10-CM | POA: Insufficient documentation

## 2019-05-24 DIAGNOSIS — S6991XA Unspecified injury of right wrist, hand and finger(s), initial encounter: Secondary | ICD-10-CM | POA: Diagnosis present

## 2019-05-24 DIAGNOSIS — Y998 Other external cause status: Secondary | ICD-10-CM | POA: Diagnosis not present

## 2019-05-24 DIAGNOSIS — Y9389 Activity, other specified: Secondary | ICD-10-CM | POA: Insufficient documentation

## 2019-05-24 MED ORDER — IBUPROFEN 400 MG PO TABS: 400.0000 mg | ORAL_TABLET | Freq: Four times a day (QID) | ORAL | 0 refills | Status: DC | PRN

## 2019-05-24 MED ORDER — OXYCODONE-ACETAMINOPHEN 5-325 MG PO TABS
1.0000 | ORAL_TABLET | Freq: Once | ORAL | Status: AC
  Administered 2019-05-24: 1 via ORAL
  Filled 2019-05-24: qty 1

## 2019-05-24 NOTE — ED Triage Notes (Signed)
Pt reports rolled off the bed and caught herself wring with her right wrist. Pt states painful today and no full range of motion.

## 2019-05-24 NOTE — ED Provider Notes (Signed)
[facility] Emergency Department Provider Note  ____________________________________________  Time seen: Approximately 11:01 AM  I have reviewed the triage vital signs and the nursing notes.   HISTORY  Chief Complaint Wrist Pain    [facility] is a 23 y.o. female that presents to the emergency department for evaluation of right wrist pain after falling out of bed this morning. No alleviating measures have been attempted. She did not hit her head. No additional injuries. No numbness, tingling.   Past Medical History:  Diagnosis Date  . 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 Allergic rhinitis   . Anxiety and depression   . Headache    migraine- w/o aura  . Hematuria     Patient Active Problem List   Diagnosis Date Noted  . Labor and delivery indication for care or intervention 11/26/2018  . Pregnancy 11/12/2018  . Uterine contractions during pregnancy 11/12/2018  . Abdominal pain in pregnancy, third trimester 09/09/2018  . Indication for care or intervention related to labor and delivery 04/25/2016  . Migraine without aura and without status migrainosus, not intractable 10/17/2015    Past Surgical History:  Procedure Laterality Date  . NO PAST SURGERIES    . wisdom teeth pulled      Prior to Admission medications   Medication Sig Start Date End Date Taking? Authorizing Provider  etonogestrel (NEXPLANON) 68 MG IMPL implant 1 each by Subdermal route once.    [provider]  ibuprofen (ADVIL) 400 MG tablet Take 1 tablet (400 mg total) by mouth every 6 (six) hours as needed. 05/24/19   Enid DerryWagner, Kayleena Eke, PA-C  sertraline (ZOLOFT) 50 MG tablet Take 1 tablet (50 mg total) by mouth daily. 12/02/18   Hildred Laserherry, Anika, MD    Allergies Feraheme [ferumoxytol]  Family History  Problem Relation Age of Onset  . Hypertension Father   . Diabetes Neg Hx   . Cancer Neg Hx   .  Hearing loss Neg Hx     Social History Social History   Tobacco Use  . Smoking status: Never Smoker  . Smokeless tobacco: Never Used  Substance Use Topics  . Alcohol use: No    Alcohol/week: 0.0 standard drinks  . Drug use: No     Review of Systems  Cardiovascular: No chest pain. Respiratory: No SOB. Gastrointestinal: No abdominal pain.  No nausea, no vomiting.  Musculoskeletal: Positive for wrist pain. Skin: Negative for rash, abrasions, lacerations, ecchymosis. Neurological: Negative for numbness or tingling   ____________________________________________   PHYSICAL EXAM:  VITAL SIGNS: ED Triage Vitals  Enc Vitals Group     BP 05/24/19 1054 114/71     Pulse Rate 05/24/19 1054 (!) 50     Resp 05/24/19 1054 18     Temp 05/24/19 1054 98.3 F (36.8 C)     Temp Source 05/24/19 1054 Oral     SpO2 05/24/19 1054 98 %     Weight 05/24/19 1030 130 lb (59 kg)     Height 05/24/19 1030 5\' 2"  (1.575 m)     Head Circumference --      Peak Flow --      Pain Score 05/24/19 1030 6     Pain Loc --      Pain Edu? --      Excl. in GC? --      Constitutional: Alert and oriented. Well appearing and in no acute distress. Eyes: Conjunctivae are  normal. PERRL. EOMI. Head: Atraumatic. ENT:      Ears:      Nose: No congestion/rhinnorhea.      Mouth/Throat: Mucous membranes are moist.  Neck: No stridor.  Cardiovascular: Normal rate, regular rhythm.  Good peripheral circulation. Symmetric radial pulses bilaterally. Respiratory: Normal respiratory effort without tachypnea or retractions. Lungs CTAB. Good air entry to the bases with no decreased or absent breath sounds. Musculoskeletal: Full range of motion to all extremities. No gross deformities appreciated. Mild tenderness to palpation to volar wrist. Full ROM of wrist. No swelling or ecchymosis.  Neurologic:  Normal speech and language. No gross focal neurologic deficits are appreciated.  Skin:  Skin is warm, dry and intact. No  rash noted. Psychiatric: Mood and affect are normal. Speech and behavior are normal. Patient exhibits appropriate insight and judgement.   ____________________________________________   LABS (all labs ordered are listed, but only abnormal results are displayed)  Labs Reviewed - No data to display ____________________________________________  EKG   ____________________________________________  RADIOLOGY Robinette Haines, personally viewed and evaluated these images (plain radiographs) as part of my medical decision making, as well as reviewing the written report by the radiologist.  Dg Wrist Complete Right  Result Date: 05/24/2019 CLINICAL DATA:  Pain after fall. EXAM: RIGHT WRIST - COMPLETE 3+ VIEW COMPARISON:  None. FINDINGS: There is no evidence of fracture or dislocation. There is no evidence of arthropathy or other focal bone abnormality. Soft tissues are unremarkable. IMPRESSION: Negative. Electronically Signed   By: Dorise Bullion III M.D   On: 05/24/2019 11:49    ____________________________________________    PROCEDURES  Procedure(s) performed:    Procedures    Medications  oxyCODONE-acetaminophen (PERCOCET/ROXICET) 5-325 MG per tablet 1 tablet (1 tablet Oral Given 05/24/19 1137)     ____________________________________________   INITIAL IMPRESSION / ASSESSMENT AND PLAN / ED COURSE  Pertinent labs & imaging results that were available during my care of the patient were reviewed by me and considered in my medical decision making (see chart for details).  Review of the [place] CSRS was performed in accordance of the [place] prior to dispensing any controlled drugs.   Patient's diagnosis is consistent with wrist sprain. Xray negative for acute abnormalities.  Patient will be discharged home with prescriptions for motrin. Patient is to follow up with PCP or ortho as directed. Patient is given ED precautions to return to the ED for any worsening or new  symptoms.  [facility] evaluated in Emergency Department on 05/24/2019 for the symptoms described in the history of present illness. She was evaluated in the context of the global COVID-19 pandemic, which necessitated consideration that the patient might be at risk for infection with the SARS-CoV-2 virus that causes COVID-19. Institutional protocols and algorithms that pertain to the evaluation of patients at risk for COVID-19 are in a state of rapid change based on information released by regulatory bodies including the CDC and federal and state organizations. These policies and algorithms were followed during the patient's care in the ED.   ____________________________________________  FINAL CLINICAL IMPRESSION(S) / ED DIAGNOSES  Final diagnoses:  Sprain of right wrist, initial encounter      NEW MEDICATIONS STARTED DURING THIS VISIT:  ED Discharge Orders         Ordered    ibuprofen (ADVIL) 400 MG tablet  Every 6 hours PRN     05/24/19 1154              This chart was  dictated using voice recognition software/Dragon. Despite best efforts to proofread, errors can occur which can change the meaning. Any change was purely unintentional.    Enid Derry, PA-C 05/24/19 1523    Emily Filbert, MD 05/24/19 [id number]

## 2019-05-24 NOTE — Discharge Instructions (Addendum)
Your x-ray is negative for a fracture.  Please ice and elevate wrist today.  Please wear wrist splint.  You can take ibuprofen for pain and inflammation.

## 2019-05-24 NOTE — ED Notes (Signed)
See triage note  Presents with pain to right wrist  States she was falling out of bed and tried to stop herself    Min swelling noted  Pain increases with movement  goodpulses

## 2019-07-29 ENCOUNTER — Emergency Department: Payer: Medicaid Other

## 2019-07-29 ENCOUNTER — Emergency Department
Admission: EM | Admit: 2019-07-29 | Discharge: 2019-07-29 | Disposition: A | Discharge status: Home / Self Care | Payer: Medicaid Other | Attending: Emergency Medicine | Admitting: Emergency Medicine

## 2019-07-29 ENCOUNTER — Encounter: Payer: Self-pay | Admitting: Emergency Medicine

## 2019-07-29 ENCOUNTER — Other Ambulatory Visit: Payer: Self-pay

## 2019-07-29 DIAGNOSIS — Z20828 Contact with and (suspected) exposure to other viral communicable diseases: Secondary | ICD-10-CM | POA: Insufficient documentation

## 2019-07-29 DIAGNOSIS — R0600 Dyspnea, unspecified: Secondary | ICD-10-CM | POA: Diagnosis not present

## 2019-07-29 DIAGNOSIS — R079 Chest pain, unspecified: Secondary | ICD-10-CM | POA: Diagnosis not present

## 2019-07-29 LAB — CBC
HCT: 43.9 % (ref 36.0–46.0)
Hemoglobin: 14.1 g/dL (ref 12.0–15.0)
MCH: 25.6 pg — ABNORMAL LOW (ref 26.0–34.0)
MCHC: 32.1 g/dL (ref 30.0–36.0)
MCV: 79.8 fL — ABNORMAL LOW (ref 80.0–100.0)
Platelets: 338 10*3/uL (ref 150–400)
RBC: 5.5 MIL/uL — ABNORMAL HIGH (ref 3.87–5.11)
RDW: 14.5 % (ref 11.5–15.5)
WBC: 9.2 10*3/uL (ref 4.0–10.5)
nRBC: 0 % (ref 0.0–0.2)

## 2019-07-29 LAB — SARS CORONAVIRUS 2 (TAT 6-24 HRS): SARS Coronavirus 2: NEGATIVE

## 2019-07-29 LAB — TROPONIN I (HIGH SENSITIVITY)
Troponin I (High Sensitivity): 3 ng/L (ref <18)
Troponin I (High Sensitivity): 3 ng/L (ref <18)

## 2019-07-29 LAB — BASIC METABOLIC PANEL
Anion gap: 7 (ref 5–15)
BUN: 12 mg/dL (ref 6–20)
CO2: 23 mmol/L (ref 22–32)
Calcium: 8.8 mg/dL — ABNORMAL LOW (ref 8.9–10.3)
Chloride: 107 mmol/L (ref 98–111)
Creatinine, Ser: 0.72 mg/dL (ref 0.44–1.00)
GFR calc Af Amer: >60 mL/min (ref >60)
GFR calc non Af Amer: >60 mL/min (ref >60)
Glucose, Bld: 106 mg/dL — ABNORMAL HIGH (ref 70–99)
Potassium: 3.8 mmol/L (ref 3.5–5.1)
Sodium: 137 mmol/L (ref 135–145)

## 2019-07-29 LAB — FIBRIN DERIVATIVES D-DIMER (ARMC ONLY): Fibrin derivatives D-dimer (ARMC): 352.34 ng/mL (FEU) (ref 0.00–499.00)

## 2019-07-29 MED ORDER — SODIUM CHLORIDE 0.9% FLUSH: 3.0000 mL | Freq: Once | INTRAVENOUS | Status: DC

## 2019-07-29 NOTE — ED Provider Notes (Signed)
[facility] Emergency Department Provider Note       Time seen: ----------------------------------------- 12:40 PM on 07/29/2019 -----------------------------------------  I have reviewed the triage vital signs and the nursing notes.  HISTORY Chief Complaint Chest Pain   HPI Marcia Hodges is a 23 y.o. female with a history of allergic rhinitis, anxiety depression, who presents to the ED for chest pain.  Patient describes chest pain that comes and goes, she is had some nausea as well.  She also has a feeling like she cannot take a deep breath and it hurts when she takes a deep breath.  She is does have the Implanon for birth control, denies any significant family history.  Past Medical History:  Diagnosis Date  . 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 Allergic rhinitis   . Anxiety and depression   . Headache    migraine- w/o aura  . Hematuria     Patient Active Problem List   Diagnosis Date Noted  . Labor and delivery indication for care or intervention 11/26/2018  . Pregnancy 11/12/2018  . Uterine contractions during pregnancy 11/12/2018  . Abdominal pain in pregnancy, third trimester 09/09/2018  . Indication for care or intervention related to labor and delivery 04/25/2016  . Migraine without aura and without status migrainosus, not intractable 10/17/2015    Past Surgical History:  Procedure Laterality Date  . NO PAST SURGERIES    . wisdom teeth pulled      Allergies Feraheme [ferumoxytol]  Social History Social History   Tobacco Use  . Smoking status: Never Smoker  . Smokeless tobacco: Never Used  Substance Use Topics  . Alcohol use: No    Alcohol/week: 0.0 standard drinks  . Drug use: No    Review of Systems Constitutional: Negative for fever. Cardiovascular: Positive for chest pain Respiratory: Negative for shortness of breath. Gastrointestinal: Negative for  abdominal pain, vomiting and diarrhea. Musculoskeletal: Negative for back pain. Skin: Negative for rash. Neurological: Negative for headaches, focal weakness or numbness.  All systems negative/normal/unremarkable except as stated in the HPI  ____________________________________________   PHYSICAL EXAM:  VITAL SIGNS: ED Triage Vitals  Enc Vitals Group     BP 07/29/19 1041 111/75     Pulse Rate 07/29/19 1041 93     Resp 07/29/19 1041 16     Temp 07/29/19 1041 98.3 F (36.8 C)     Temp Source 07/29/19 1041 Oral     SpO2 07/29/19 1041 99 %     Weight 07/29/19 1038 133 lb (60.3 kg)     Height 07/29/19 1038 5\' 2"  (1.575 m)     Head Circumference --      Peak Flow --      Pain Score 07/29/19 1038 4     Pain Loc --      Pain Edu? --      Excl. in [place]? --    Constitutional: Alert and oriented. Well appearing and in no distress. Eyes: Conjunctivae are normal. Normal extraocular movements. ENT      Head: Normocephalic and atraumatic.      Nose: No congestion/rhinnorhea.      Mouth/Throat: Mucous membranes are moist.      Neck: No stridor. Cardiovascular: Normal rate, regular rhythm. No murmurs, rubs, or gallops. Respiratory: Normal respiratory effort without tachypnea nor retractions. Breath sounds are clear and equal bilaterally. No wheezes/rales/rhonchi. Gastrointestinal: Soft and nontender. Normal bowel sounds Musculoskeletal: Nontender with  normal range of motion in extremities. No lower extremity tenderness nor edema. Neurologic:  Normal speech and language. No gross focal neurologic deficits are appreciated.  Skin:  Skin is warm, dry and intact. No rash noted. Psychiatric: Mood and affect are normal. Speech and behavior are normal.  ____________________________________________  EKG: Interpreted by me.  Sinus rhythm with sinus arrhythmia, rate is 84 bpm, normal PR interval, normal QRS, normal QT  ____________________________________________  ED COURSE:  As part of my  medical decision making, I reviewed the following data within the electronic MEDICAL RECORD NUMBER History obtained from family if available, nursing notes, old chart and ekg, as well as notes from prior ED visits. Patient presented for chest pain, we will assess with labs and imaging as indicated at this time.   Procedures  Marcia Hodges was evaluated in Emergency Department on 07/29/2019 for the symptoms described in the history of present illness. She was evaluated in the context of the global COVID-19 pandemic, which necessitated consideration that the patient might be at risk for infection with the SARS-CoV-2 virus that causes COVID-19. Institutional protocols and algorithms that pertain to the evaluation of patients at risk for COVID-19 are in a state of rapid change based on information released by regulatory bodies including the CDC and federal and state organizations. These policies and algorithms were followed during the patient's care in the ED.  ____________________________________________   LABS (pertinent positives/negatives)  Labs Reviewed  BASIC METABOLIC PANEL - Abnormal; Notable for the following components:      Result Value   Glucose, Bld 106 (*)    Calcium 8.8 (*)    All other components within normal limits  CBC - Abnormal; Notable for the following components:   RBC 5.50 (*)    MCV 79.8 (*)    MCH 25.6 (*)    All other components within normal limits  SARS CORONAVIRUS 2 (TAT 6-24 HRS)  FIBRIN DERIVATIVES D-DIMER ([facility] ONLY)  POC URINE PREG, ED  TROPONIN I (HIGH SENSITIVITY)  TROPONIN I (HIGH SENSITIVITY)    RADIOLOGY Images were viewed by me  Chest x-ray is unremarkable ____________________________________________   DIFFERENTIAL DIAGNOSIS   Musculoskeletal pain, GERD, anxiety, PE, MI unlikely  FINAL ASSESSMENT AND PLAN  Chest pain, dyspnea   Plan: The patient had presented for chest pain and shortness of breath. Patient's labs are unremarkable  including repeat troponin and D-dimer. Patient's imaging was unremarkable.  No clear etiology for her symptoms, she is cleared for outpatient follow-up.   Ulice Dash, MD    Note: This note was generated in part or whole with voice recognition software. Voice recognition is usually quite accurate but there are transcription errors that can and very often do occur. I apologize for any typographical errors that were not detected and corrected.     Emily Filbert, MD 07/29/19 1420

## 2019-07-29 NOTE — ED Triage Notes (Signed)
Pt to ED via POV c/o chest pain that started yesterday. Pt states the pain comes and goes. Pt reports some nausea as well. Pt states that she has episodes of feeling like she cannot breath. Pt denies cardiac hx, does have hx/o anxiety. Reports prior episodes of chest pain when pregnant but states that work up was negative. Pt is in NAD at this time.

## 2020-03-03 ENCOUNTER — Ambulatory Visit: Payer: Medicaid Other | Admitting: Obstetrics and Gynecology

## 2020-03-15 ENCOUNTER — Ambulatory Visit: Payer: Medicaid Other | Admitting: Obstetrics and Gynecology

## 2020-07-01 ENCOUNTER — Ambulatory Visit: Payer: Self-pay

## 2020-08-05 NOTE — L&D Delivery Note (Signed)
Delivery Note At 9:07 AM a viable  female child was delivered via Vaginal, Spontaneous (Presentation:      ).  APGAR: 9/9, ; weight  .   Placenta status: Spontaneous, Intact.  Cord: 3 vessels with the following complications: precipitous vaginal delivery .   Anesthesia: None Episiotomy: None Lacerations: None Suture Repair:  n/a Est. Blood Loss (mL):  150 cc  Mom to postpartum.  Baby to Couplet care / Skin to Skin.  Marcia Hodges 04/20/2021, 9:15 AM   Dr Valentino Saxon patient - I was called into room because patient was ready to push. One push and head delivered with difficulty . Shoulders and body delivered with difficulty . Placenta spontaneous 4 min later .  Good hemostasis

## 2020-08-15 ENCOUNTER — Ambulatory Visit (INDEPENDENT_AMBULATORY_CARE_PROVIDER_SITE_OTHER): Payer: BC Managed Care – PPO | Admitting: Obstetrics and Gynecology

## 2020-08-15 ENCOUNTER — Other Ambulatory Visit: Payer: Self-pay

## 2020-08-15 ENCOUNTER — Encounter: Payer: Self-pay | Admitting: Obstetrics and Gynecology

## 2020-08-15 VITALS — BP 129/74 | HR 93 | Ht 62.0 in | Wt 144.2 lb

## 2020-08-15 DIAGNOSIS — N926 Irregular menstruation, unspecified: Secondary | ICD-10-CM

## 2020-08-15 LAB — POCT URINE PREGNANCY: Preg Test, Ur: POSITIVE — AB

## 2020-08-15 NOTE — Progress Notes (Signed)
Subjective:    Marcia Hodges is a 25 y.o. G55P2002 female who presents for evaluation of amenorrhea. She believes she could be pregnant. Pregnancy is desired. Pregnancy was planned. Sexual Activity: single partner, contraception: none. Current symptoms also include: positive home pregnancy test. Last period was normal.   Patient's last menstrual period was 07/13/2020. The following portions of the patient's history were reviewed and updated as appropriate: allergies, current medications, past family history, past medical history, past social history, past surgical history and problem list.  Review of Systems A comprehensive review of systems was negative.     Objective:    BP 129/74   Pulse 93   Ht 5\' 2"  (1.575 m)   Wt 144 lb 3.2 oz (65.4 kg)   LMP 08/01/2020   BMI 26.37 kg/m  General: alert, no distress and no acute distress    Lab Review Urine HCG: positive    Assessment:    Absence of menstruation.     Plan:    Pregnancy Test: Positive: EDC: 04/19/2020, with EGA 4.5 weeks. Briefly discussed pre-natal care options. Pregnancy, Childbirth and the Newborn book given. Encouraged well-balanced diet, plenty of rest when needed, pre-natal vitamins daily and walking for exercise. Discussed self-help for nausea, avoiding OTC medications until consulting provider or pharmacist, other than Tylenol as needed, minimal caffeine (1-2 cups daily) and avoiding alcohol. She will schedule her initial OB visit in the next month with her PCP or OB provider. Feel free to call with any questions.   04/21/2020, MD [facility]

## 2020-08-15 NOTE — Progress Notes (Signed)
PT is present today for confirmation of pregnancy. Pt LMP 07/12/2020. UPT done today results were positive. Pt stated that she is doing well no complaints.  Prenatal samples given.

## 2020-08-15 NOTE — Patient Instructions (Signed)

## 2020-09-07 ENCOUNTER — Ambulatory Visit (INDEPENDENT_AMBULATORY_CARE_PROVIDER_SITE_OTHER): Payer: BC Managed Care – PPO

## 2020-09-07 ENCOUNTER — Other Ambulatory Visit: Payer: Self-pay

## 2020-09-07 DIAGNOSIS — N926 Irregular menstruation, unspecified: Secondary | ICD-10-CM

## 2020-09-07 DIAGNOSIS — Z3A01 Less than 8 weeks gestation of pregnancy: Secondary | ICD-10-CM

## 2020-09-07 MED ORDER — DOXYLAMINE-PYRIDOXINE 10-10 MG PO TBEC: 10.0000 mg | DELAYED_RELEASE_TABLET | Freq: Two times a day (BID) | ORAL | 0 refills | Status: DC

## 2020-09-11 ENCOUNTER — Ambulatory Visit (INDEPENDENT_AMBULATORY_CARE_PROVIDER_SITE_OTHER): Payer: BC Managed Care – PPO | Admitting: Obstetrics and Gynecology

## 2020-09-11 ENCOUNTER — Other Ambulatory Visit: Payer: BC Managed Care – PPO

## 2020-09-11 ENCOUNTER — Other Ambulatory Visit: Payer: Self-pay

## 2020-09-11 VITALS — BP 105/72 | HR 87 | Ht 62.0 in | Wt 144.4 lb

## 2020-09-11 DIAGNOSIS — Z3481 Encounter for supervision of other normal pregnancy, first trimester: Secondary | ICD-10-CM

## 2020-09-11 DIAGNOSIS — O219 Vomiting of pregnancy, unspecified: Secondary | ICD-10-CM

## 2020-09-11 DIAGNOSIS — Z202 Contact with and (suspected) exposure to infections with a predominantly sexual mode of transmission: Secondary | ICD-10-CM

## 2020-09-11 NOTE — Progress Notes (Signed)
[facility] presents for NOB nurse interview visit. Pregnancy confirmation done 08/16/2019 with [facility].  G-3 .  P-2002 Dating scan done on 09/07/20 showed SIUP 7 2/7  With EDD 04/24/21. Pregnancy education material explained and given. _0__ cats in the home. NOB labs ordered. HIV labs and Drug screen were explained ordered. PNV encouraged. Genetic screening options discussed. Genetic testing: to be done at NOB [facility].  Pt may discuss with provider.  Diclegisl working well to control her n/v. Financial policy reviewed. FMLA/Form Fee reviewed and signed.  Pt. To follow up with provider in _4_ weeks for NOB physical.  All questions answered.

## 2020-09-11 NOTE — Patient Instructions (Addendum)
[phone] and Pregnancy  Pregnant and recently pregnant women should take steps to stay healthy, including . getting a COVID-19 vaccine  . following guidelines from health officials for when to wear a mask and take other steps to prevent infection . keeping your prenatal and postpartum care visits . talking with an OB-GYN or other health care professional if you have any questions about your health or COVID-19 . calling 911 or going to the hospital right away if you need emergency health care   If you think you may have been exposed to the coronavirus and have a fever or cough, call your ob-gyn or other health care professional for advice. If you have emergency warning signs, call 911 or go to the hospital right away. Emergency warning signs include the following: . Having a hard time breathing or shortness of breath (more than what has been normal for you during pregnancy) . Ongoing pain or pressure in the chest . Sudden confusion . Being unable to respond to others . Blue lips or face . Decreased fetal movement/absent of fetal movement . Fever greater than 100.4  If you go to the hospital, try to call ahead to let them know you are coming so they can prepare. If you have other symptoms that worry you, call your OB-GYN or 911.   If you are diagnosed with COVID-19, follow the advice from the [facility] and your ob-gyn or other health care professional. The current CDC advice for all people with COVID-19 includes the following: . Stay home except to get medical care. Avoid public transportation. Marland Kitchen Speak with your health care team over the phone before going to their office. Get medical care right away if you feel worse or think it's an emergency. . Separate yourself from other people in your home. . Wear a face mask when you are around other people and when you go to get medical care. . Use the safe medication list to treat the symptoms i.e., cough, congestion, sore throat, fever. . If you are  having nausea and are unable to hold down liquids or food contact the office as we can prescribe medication. . Stay hydrate. Frequent sips of water, broth, ice chips, and popsicle. . Small bland meals.  . Hand hygiene- Wash hands frequently and/or use hand sanitizer.  . Wipe down surfaces.    For additional information please visit the web site below.    [email or web address]   WHAT OB PATIENTS CAN EXPECT   Confirmation of pregnancy and ultrasound ordered if medically indicated-[redacted] weeks gestation  New OB (NOB) intake with nurse and New OB (NOB) labs- [redacted] weeks gestation  New OB (NOB) physical examination with provider- 11/[redacted] weeks gestation  Flu vaccine-[redacted] weeks gestation  Anatomy scan-[redacted] weeks gestation  Glucose tolerance test, blood work to test for anemia, T-dap vaccine-[redacted] weeks gestation  Vaginal swabs/cultures-STD/Group B strep-[redacted] weeks gestation  Appointments every 4 weeks until 28 weeks  Every 2 weeks from 28 weeks until 36 weeks  Weekly visits from 36 weeks until delivery  Morning Sickness  Morning sickness is when you feel like you may vomit (feel nauseous) during pregnancy. Sometimes, you may vomit. Morning sickness most often happens in the morning, but it can also happen at any time of the day. Some women may have morning sickness that makes them vomit all the time. This is a more serious problem that needs treatment. What are the causes? The cause of this condition is not known. What increases the risk?  You had  vomiting or a feeling like you may vomit before your pregnancy.  You had morning sickness in another pregnancy.  You are pregnant with more than one baby, such as twins. What are the signs or symptoms?  Feeling like you may vomit.  Vomiting. How is this treated? Treatment is usually not needed for this condition. You may only need to change what you eat. In some cases, your doctor may give you some things to take for your condition. These  include:  Vitamin B6 supplements.  Medicines to treat the feeling that you may vomit.  Ginger. Follow these instructions at home: Medicines  Take over-the-counter and prescription medicines only as told by your doctor. Do not take any medicines until you talk with your doctor about them first.  Take multivitamins before you get pregnant. These can stop or lessen the symptoms of morning sickness. Eating and drinking  Eat dry toast or crackers before getting out of bed.  Eat 5 or 6 small meals a day.  Eat dry and bland foods like rice and baked potatoes.  Do not eat greasy, fatty, or spicy foods.  Have someone cook for you if the smell of food causes you to vomit or to feel like you may vomit.  If you feel like you may vomit after taking prenatal vitamins, take them at night or with a snack.  Eat protein foods when you need a snack. Nuts, yogurt, and cheese are good choices.  Drink fluids throughout the day.  Try ginger ale made with real ginger, ginger tea made from fresh grated ginger, or ginger candies. General instructions  Do not smoke or use any products that contain nicotine or tobacco. If you need help quitting, ask your doctor.  Use an air purifier to keep the air in your house free of smells.  Get lots of fresh air.  Try to avoid smells that make you feel sick.  Try wearing an acupressure wristband. This is a wristband that is used to treat seasickness.  Try a treatment called acupuncture. In this treatment, a doctor puts needles into certain areas of your body to make you feel better. Contact a doctor if:  You need medicine to feel better.  You feel dizzy or light-headed.  You are losing weight. Get help right away if:  The feeling that you may vomit will not go away, or you cannot stop vomiting.  You faint.  You have very bad pain in your belly. Summary  Morning sickness is when you feel like you may vomit (feel nauseous) during pregnancy.  You  may feel sick in the morning, but you can feel this way at any time of the day.  Making some changes to what you eat may help your symptoms go away. This information is not intended to replace advice given to you by your health care provider. Make sure you discuss any questions you have with your health care provider. Document Revised: 03/06/2020 Document Reviewed: 02/14/2020 Elsevier Patient Education  2021 [employer]. How a Baby Grows During Pregnancy Pregnancy begins when a female's sperm enters a female's egg. This is called fertilization. Fertilization usually happens in one of the fallopian tubes that connect the ovaries to the uterus. The fertilized egg moves down the fallopian tube to the uterus. Once it reaches the uterus, it implants into the lining of the uterus and begins to grow. For the first 8 weeks, the fertilized egg is called an embryo. After 8 weeks, it is called a fetus.  As the fetus continues to grow, it receives oxygen and nutrients through the placenta, which is an organ that grows to support the developing baby. The placenta is the life support system for the baby. It provides oxygen and nutrition and removes waste. How long does a typical pregnancy last? A pregnancy usually lasts 280 days, or about 40 weeks. Pregnancy is divided into three periods of growth, also called trimesters:  First trimester: 0-12 weeks.  Second trimester: 13-27 weeks.  Third trimester: 28-40 weeks. The day when your baby is ready to be born (full term) is your estimated date of delivery. However, most babies are not born on their estimated date of delivery. How does my baby develop month by month? First month  The fertilized egg attaches to the inside of the uterus.  Some cells will form the placenta. Others will form the fetus.  The arms, legs, brain, spinal cord, lungs, and heart begin to develop.  At the end of the first month, the heart begins to beat. Second month  The bones, inner  ear, eyelids, hands, and feet form.  The genitals develop.  By the end of 8 weeks, all major organs are developing. Third month  All of the internal organs are forming.  Teeth develop below the gums.  Bones and muscles begin to grow. The spine can flex.  The skin is transparent.  Fingernails and toenails begin to form.  Arms and legs continue to grow longer, and hands and feet develop.  The fetus is about 3 inches (7.6 cm) long. Fourth month  The placenta is completely formed.  The external sex organs, neck, outer ear, eyebrows, eyelids, and fingernails are formed.  The fetus can hear, swallow, and move its arms and legs.  The kidneys begin to produce urine.  The skin is covered with a white, waxy coating (vernix) and very fine hair (lanugo). Fifth month  The fetus moves around more and can be felt for the first time (quickening).  The fetus starts to sleep and wake up and may begin to suck a finger.  The nails grow to the end of the fingers.  The organ in the digestive system that makes bile (gallbladder) functions and helps to digest nutrients.  If the fetus is a female, eggs are present in the ovaries. If the fetus is a female, testicles start to move down into the scrotum. Sixth month  The lungs are formed.  The eyes open. The brain continues to develop.  Your baby has fingerprints and toe prints. Your baby's hair grows thicker.  At the end of the second trimester, the fetus is about 9 inches (22.9 cm) long. Seventh month  The fetus kicks and stretches.  The eyes are developed enough to sense changes in light.  The hands can make a grasping motion.  The fetus responds to sound. Eighth month  Most organs and body systems are fully developed and functioning.  Bones harden, and taste buds develop. The fetus may hiccup.  Certain areas of the brain are still developing. The skull remains soft. Ninth month  The fetus gains about  lb (0.23 kg) each  week.  The lungs are fully developed.  Patterns of sleep develop.  The fetus's head typically moves into a head-down position (vertex) in the uterus to prepare for birth.  The fetus weighs 6-9 lb (2.72-4.08 kg) and is 19-20 inches (48.26-50.8 cm) long.   How do I know if my baby is developing well? Always talk with your  health care provider about any concerns that you may have about your pregnancy and your baby. At each prenatal visit, your health care provider will do several different tests to check on your health and keep track of your baby's development. These include:  Fundal height and position. To do this, your health care provider will: ? Measure your growing belly from your pubic bone to the top of the uterus using a tape measure. ? Feel your belly to determine your baby's position.  Heartbeat. An ultrasound in the first trimester can confirm pregnancy and show a heartbeat, depending on how far along you are. Your health care provider will check your baby's heart rate at every prenatal visit. You will also have a second trimester ultrasound to check your baby's development. Follow these instructions at home:  Take prenatal vitamins as told by your health care provider. These include vitamins such as folic acid, iron, calcium, and vitamin D. They are important for healthy development.  Take over-the-counter and prescription medicines only as told by your health care provider.  Keep all follow-up visits. This is important. Follow-up visits include prenatal care and screening tests. Summary  A pregnancy usually lasts 280 days, or about 40 weeks. Pregnancy is divided into three periods of growth, also called trimesters.  Your health care provider will monitor your baby's growth and development throughout your pregnancy.  Follow your health care provider's recommendations about taking prenatal vitamins and medicines during your pregnancy.  Talk with your health care provider if  you have any concerns about your pregnancy or your developing baby. This information is not intended to replace advice given to you by your health care provider. Make sure you discuss any questions you have with your health care provider. Document Revised: 12/29/2019 Document Reviewed: 11/04/2019 Elsevier Patient Education  [place]. Obstetrics: Normal and Problem Pregnancies (7th ed., pp. 102-121). [place], [place]: Elsevier."> Textbook of Family Medicine (9th ed., pp. [phone]). [place], [place]: Elsevier [employer].">  First Trimester of Pregnancy  The first trimester of pregnancy starts on the first day of your last menstrual period until the end of week 12. This is months 1 through 3 of pregnancy. A week after a sperm fertilizes an egg, the egg will implant into the wall of the uterus and begin to develop into a baby. By the end of 12 weeks, all the baby's organs will be formed and the baby will be 2-3 inches in size. Body changes during your first trimester Your body goes through many changes during pregnancy. The changes vary and generally return to normal after your baby is born. Physical changes  You may gain or lose weight.  Your breasts may begin to grow larger and become tender. The tissue that surrounds your nipples (areola) may become darker.  Dark spots or blotches (chloasma or mask of pregnancy) may develop on your face.  You may have changes in your hair. These can include thickening or thinning of your hair or changes in texture. Health changes  You may feel nauseous, and you may vomit.  You may have heartburn.  You may develop headaches.  You may develop constipation.  Your gums may bleed and may be sensitive to brushing and flossing. Other changes  You may tire easily.  You may urinate more often.  Your menstrual periods will stop.  You may have a loss of appetite.  You may develop cravings for certain kinds of food.  You may have changes in your  emotions from day to  day.  You may have more vivid and strange dreams. Follow these instructions at home: Medicines  Follow your health care provider's instructions regarding medicine use. Specific medicines may be either safe or unsafe to take during pregnancy. Do not take any medicines unless told to by your health care provider.  Take a prenatal vitamin that contains at least 600 micrograms (mcg) of folic acid. Eating and drinking  Eat a healthy diet that includes fresh fruits and vegetables, whole grains, good sources of protein such as meat, eggs, or tofu, and low-fat dairy products.  Avoid raw meat and unpasteurized juice, milk, and cheese. These carry germs that can harm you and your baby.  If you feel nauseous or you vomit: ? Eat 4 or 5 small meals a day instead of 3 large meals. ? Try eating a few soda crackers. ? Drink liquids between meals instead of during meals.  You may need to take these actions to prevent or treat constipation: ? Drink enough fluid to keep your urine pale yellow. ? Eat foods that are high in fiber, such as beans, whole grains, and fresh fruits and vegetables. ? Limit foods that are high in fat and processed sugars, such as fried or sweet foods. Activity  Exercise only as directed by your health care provider. Most people can continue their usual exercise routine during pregnancy. Try to exercise for 30 minutes at least 5 days a week.  Stop exercising if you develop pain or cramping in the lower abdomen or lower back.  Avoid exercising if it is very hot or humid or if you are at high altitude.  Avoid heavy lifting.  If you choose to, you may have sex unless your health care provider tells you not to. Relieving pain and discomfort  Wear a good support bra to relieve breast tenderness.  Rest with your legs elevated if you have leg cramps or low back pain.  If you develop bulging veins (varicose veins) in your legs: ? Wear support hose as told by  your health care provider. ? Elevate your feet for 15 minutes, 3-4 times a day. ? Limit salt in your diet. Safety  Wear your seat belt at all times when driving or riding in a car.  Talk with your health care provider if someone is verbally or physically abusive to you.  Talk with your health care provider if you are feeling sad or have thoughts of hurting yourself. Lifestyle  Do not use hot tubs, steam rooms, or saunas.  Do not douche. Do not use tampons or scented sanitary pads.  Do not use herbal remedies, alcohol, illegal drugs, or medicines that are not approved by your health care provider. Chemicals in these products can harm your baby.  Do not use any products that contain nicotine or tobacco, such as cigarettes, e-cigarettes, and chewing tobacco. If you need help quitting, ask your health care provider.  Avoid cat litter boxes and soil used by cats. These carry germs that can cause birth defects in the baby and possibly loss of the unborn baby (fetus) by miscarriage or stillbirth. General instructions  During routine prenatal visits in the first trimester, your health care provider will do a physical exam, perform necessary tests, and ask you how things are going. Keep all follow-up visits. This is important.  Ask for help if you have counseling or nutritional needs during pregnancy. Your health care provider can offer advice or refer you to specialists for help with various needs.  Schedule  a dentist appointment. At home, brush your teeth with a soft toothbrush. Floss gently.  Write down your questions. Take them to your prenatal visits. Where to find more information  American Pregnancy Association: americanpregnancy.[place] and Gynecologists: PoolDevices.com.pt  Office on [employer]: KeywordPortfolios.com.br Contact a health care provider if you have:  Dizziness.  A fever.  Mild pelvic cramps, pelvic  pressure, or nagging pain in the abdominal area.  Nausea, vomiting, or diarrhea that lasts for 24 hours or longer.  A bad-smelling vaginal discharge.  Pain when you urinate.  Known exposure to a contagious illness, such as chickenpox, measles, Zika virus, HIV, or hepatitis. Get help right away if you have:  Spotting or bleeding from your vagina.  Severe abdominal cramping or pain.  Shortness of breath or chest pain.  Any kind of trauma, such as from a fall or a car crash.  New or increased pain, swelling, or redness in an arm or leg. Summary  The first trimester of pregnancy starts on the first day of your last menstrual period until the end of week 12 (months 1 through 3).  Eating 4 or 5 small meals a day rather than 3 large meals may help to relieve nausea and vomiting.  Do not use any products that contain nicotine or tobacco, such as cigarettes, e-cigarettes, and chewing tobacco. If you need help quitting, ask your health care provider.  Keep all follow-up visits. This is important. This information is not intended to replace advice given to you by your health care provider. Make sure you discuss any questions you have with your health care provider. Document Revised: 12/29/2019 Document Reviewed: 11/04/2019 Elsevier Patient Education  [place]. Common Medications Safe in Pregnancy  Acne:      Constipation:  Benzoyl Peroxide     Colace  Clindamycin      Dulcolax Suppository  Topica Erythromycin     Fibercon  Salicylic Acid      Metamucil         Miralax AVOID:        Senakot   Accutane    Cough:  Retin-A       Cough Drops  Tetracycline      Phenergan w/ Codeine if Rx  Minocycline      Robitussin (Plain & DM)  Antibiotics:     Crabs/Lice:  Ceclor       RID  Cephalosporins    AVOID:  E-Mycins      Kwell  Keflex  Macrobid/Macrodantin   Diarrhea:  Penicillin      Kao-Pectate  Zithromax      Imodium AD         PUSH  FLUIDS AVOID:       Cipro     Fever:  Tetracycline      Tylenol (Regular or Extra  Minocycline       Strength)  Levaquin      Extra Strength-Do not          Exceed 8 tabs/24 hrs Caffeine:        <215m/day (equiv. To 1 cup of coffee or  approx. 3 12 oz sodas)         Gas: Cold/Hayfever:       Gas-X  Benadryl      Mylicon  Claritin       Phazyme  **Claritin-D        Chlor-Trimeton    Headaches:  Dimetapp      ASA-Free Excedrin  Drixoral-Non-Drowsy     Cold Compress  Mucinex (Guaifenasin)     Tylenol (Regular or Extra  Sudafed/Sudafed-12 Hour     Strength)  **Sudafed PE Pseudoephedrine   Tylenol Cold & Sinus     Vicks Vapor Rub  Zyrtec  **AVOID if Problems With Blood Pressure         Heartburn: Avoid lying down for at least 1 hour after meals  Aciphex      Maalox     Rash:  Milk of Magnesia     Benadryl    Mylanta       1% Hydrocortisone Cream  Pepcid  Pepcid Complete   Sleep Aids:  Prevacid      Ambien   Prilosec       Benadryl  Rolaids       Chamomile Tea  Tums (Limit 4/day)     Unisom         Tylenol PM         Warm milk-add vanilla or  Hemorrhoids:       Sugar for taste  Anusol/Anusol H.C.  (RX: Analapram 2.5%)  Sugar Substitutes:  Hydrocortisone OTC     Ok in moderation  Preparation H      Tucks        Vaseline lotion applied to tissue with wiping    Herpes:     Throat:  Acyclovir      Oragel  Famvir  Valtrex     Vaccines:         Flu Shot Leg Cramps:       *Gardasil  Benadryl      Hepatitis A         Hepatitis B Nasal Spray:       Pneumovax  Saline Nasal Spray     Polio Booster         Tetanus Nausea:       Tuberculosis test or PPD  Vitamin B6 25 mg TID   AVOID:    Dramamine      *Gardasil  Emetrol       Live Poliovirus  Ginger Root 250 mg QID    MMR (measles, mumps &  High Complex Carbs @ Bedtime    rebella)  Sea Bands-Accupressure    Varicella (Chickenpox)  Unisom 1/2 tab TID     *No known complications           If received  before Pain:         Known pregnancy;   Darvocet       Resume series after  Lortab        Delivery  Percocet    Yeast:   Tramadol      Femstat  Tylenol 3      Gyne-lotrimin  Ultram       Monistat  Vicodin           MISC:         All Sunscreens           Hair Coloring/highlights          Insect Repellant's          (Including DEET)         [place], [place], [place] [address]  Phone: [phone]   [facility] (second location)  [address] [place], [place] [address]  Phone: [phone]   [facility]) [place], [place], [place] [address]  Phone: [phone]   [address] [place], [place] [address]  Phone: [phone]

## 2020-09-12 LAB — MONITOR DRUG PROFILE 14(MW)
Amphetamine Scrn, Ur: NEGATIVE ng/mL
BARBITURATE SCREEN URINE: NEGATIVE ng/mL
BENZODIAZEPINE SCREEN, URINE: NEGATIVE ng/mL
Buprenorphine, Urine: NEGATIVE ng/mL
CANNABINOIDS UR QL SCN: NEGATIVE ng/mL
Cocaine (Metab) Scrn, Ur: NEGATIVE ng/mL
Creatinine(Crt), U: 56.4 mg/dL (ref 20.0–300.0)
Fentanyl, Urine: NEGATIVE pg/mL
Meperidine Screen, Urine: NEGATIVE ng/mL
Methadone Screen, Urine: NEGATIVE ng/mL
OXYCODONE+OXYMORPHONE UR QL SCN: NEGATIVE ng/mL
Opiate Scrn, Ur: NEGATIVE ng/mL
Ph of Urine: 7.1 (ref 4.5–8.9)
Phencyclidine Qn, Ur: NEGATIVE ng/mL
Propoxyphene Scrn, Ur: NEGATIVE ng/mL
SPECIFIC GRAVITY: 1.013
Tramadol Screen, Urine: NEGATIVE ng/mL

## 2020-09-12 LAB — URINALYSIS, ROUTINE W REFLEX MICROSCOPIC
Bilirubin, UA: NEGATIVE
Glucose, UA: NEGATIVE
Ketones, UA: NEGATIVE
Nitrite, UA: NEGATIVE
Protein,UA: NEGATIVE
RBC, UA: NEGATIVE
Specific Gravity, UA: 1.012 (ref 1.005–1.030)
Urobilinogen, Ur: 1 mg/dL (ref 0.2–1.0)
pH, UA: 7 (ref 5.0–7.5)

## 2020-09-12 LAB — RPR: RPR Ser Ql: NONREACTIVE

## 2020-09-12 LAB — VIRAL HEPATITIS HBV, HCV
HCV Ab: <0.1 s/co ratio (ref 0.0–0.9)
Hep B Core Total Ab: NEGATIVE
Hep B Surface Ab, Qual: NONREACTIVE
Hepatitis B Surface Ag: NEGATIVE

## 2020-09-12 LAB — MICROSCOPIC EXAMINATION
Bacteria, UA: NONE SEEN
Casts: NONE SEEN /lpf

## 2020-09-12 LAB — NICOTINE SCREEN, URINE: Cotinine Ql Scrn, Ur: NEGATIVE ng/mL

## 2020-09-12 LAB — HCV INTERPRETATION

## 2020-09-12 LAB — HIV ANTIBODY (ROUTINE TESTING W REFLEX): HIV Screen 4th Generation wRfx: NONREACTIVE

## 2020-09-12 LAB — ABO AND RH: Rh Factor: POSITIVE

## 2020-09-12 LAB — RUBELLA SCREEN: Rubella Antibodies, IGG: 3.95 index (ref >0.99)

## 2020-09-12 LAB — ANTIBODY SCREEN: Antibody Screen: NEGATIVE

## 2020-09-12 LAB — VARICELLA ZOSTER ANTIBODY, IGG: Varicella zoster IgG: 487 index (ref >165)

## 2020-09-13 LAB — URINE CULTURE, OB REFLEX

## 2020-09-13 LAB — GC/CHLAMYDIA PROBE AMP
Chlamydia trachomatis, NAA: NEGATIVE
Neisseria Gonorrhoeae by PCR: NEGATIVE

## 2020-09-13 LAB — CULTURE, OB URINE

## 2020-10-12 ENCOUNTER — Other Ambulatory Visit (HOSPITAL_COMMUNITY)
Admission: RE | Admit: 2020-10-12 | Discharge: 2020-10-12 | Disposition: A | Discharge status: Home / Self Care | Payer: BC Managed Care – PPO | Source: Ambulatory Visit | Attending: Obstetrics and Gynecology | Admitting: Obstetrics and Gynecology

## 2020-10-12 ENCOUNTER — Encounter: Payer: Self-pay | Admitting: Obstetrics and Gynecology

## 2020-10-12 ENCOUNTER — Other Ambulatory Visit: Payer: Self-pay

## 2020-10-12 ENCOUNTER — Ambulatory Visit (INDEPENDENT_AMBULATORY_CARE_PROVIDER_SITE_OTHER): Payer: BC Managed Care – PPO | Admitting: Obstetrics and Gynecology

## 2020-10-12 VITALS — BP 110/73 | HR 83 | Ht 62.0 in | Wt 139.9 lb

## 2020-10-12 DIAGNOSIS — F419 Anxiety disorder, unspecified: Secondary | ICD-10-CM

## 2020-10-12 DIAGNOSIS — Z3482 Encounter for supervision of other normal pregnancy, second trimester: Secondary | ICD-10-CM

## 2020-10-12 DIAGNOSIS — Z124 Encounter for screening for malignant neoplasm of cervix: Secondary | ICD-10-CM

## 2020-10-12 DIAGNOSIS — F32A Depression, unspecified: Secondary | ICD-10-CM

## 2020-10-12 DIAGNOSIS — Z3A13 13 weeks gestation of pregnancy: Secondary | ICD-10-CM | POA: Diagnosis not present

## 2020-10-12 LAB — POCT URINALYSIS DIPSTICK OB
Bilirubin, UA: NEGATIVE
Blood, UA: NEGATIVE
Glucose, UA: NEGATIVE
Ketones, UA: 40
Leukocytes, UA: NEGATIVE
Nitrite, UA: NEGATIVE
POC,PROTEIN,UA: NEGATIVE
Spec Grav, UA: 1.025 (ref 1.010–1.025)
Urobilinogen, UA: 0.2 E.U./dL
pH, UA: 6.5 (ref 5.0–8.0)

## 2020-10-12 NOTE — Progress Notes (Signed)
NOB-PE PT present for initial prenatal care. Pt stated that she was doing well.  [place] completed today.

## 2020-10-12 NOTE — Patient Instructions (Signed)
WHAT OB PATIENTS CAN EXPECT   Confirmation of pregnancy and ultrasound ordered if medically indicated-[redacted] weeks gestation  New OB (NOB) intake with nurse and New OB (NOB) labs- [redacted] weeks gestation  New OB (NOB) physical examination with provider- 11/[redacted] weeks gestation  Flu vaccine-[redacted] weeks gestation  Anatomy scan-[redacted] weeks gestation  Glucose tolerance test, blood work to test for anemia, T-dap vaccine-[redacted] weeks gestation  Vaginal swabs/cultures-STD/Group B strep-[redacted] weeks gestation  Appointments every 4 weeks until 28 weeks  Every 2 weeks from 28 weeks until 36 weeks  Weekly visits from 36 weeks until delivery  Second Trimester of Pregnancy  The second trimester of pregnancy is from week 13 through week 27. This is also called months 4 through 6 of pregnancy. This is often the time when you feel your best. During the second trimester:  Morning sickness is less or has stopped.  You may have more energy.  You may feel hungry more often. At this time, your unborn baby (fetus) is growing very fast. At the end of the sixth month, the unborn baby may be up to 12 inches long and weigh about 1 pounds. You will likely start to feel the baby move between 16 and 20 weeks of pregnancy. Body changes during your second trimester Your body continues to go through many changes during this time. The changes vary and generally return to normal after the baby is born. Physical changes  You will gain more weight.  You may start to get stretch marks on your hips, belly (abdomen), and breasts.  Your breasts will grow and may hurt.  Dark spots or blotches may develop on your face.  A dark line from your belly button to the pubic area (linea nigra) may appear.  You may have changes in your hair. Health changes  You may have headaches.  You may have heartburn.  You may have trouble pooping (constipation).  You may have hemorrhoids or swollen, bulging veins (varicose veins).  Your gums may  bleed.  You may pee (urinate) more often.  You may have back pain. Follow these instructions at home: Medicines  Take over-the-counter and prescription medicines only as told by your doctor. Some medicines are not safe during pregnancy.  Take a prenatal vitamin that contains at least 600 micrograms (mcg) of folic acid. Eating and drinking  Eat healthy meals that include: ? Fresh fruits and vegetables. ? Whole grains. ? Good sources of protein, such as meat, eggs, or tofu. ? Low-fat dairy products.  Avoid raw meat and unpasteurized juice, milk, and cheese.  You may need to take these actions to prevent or treat trouble pooping: ? Drink enough fluids to keep your pee (urine) pale yellow. ? Eat foods that are high in fiber. These include beans, whole grains, and fresh fruits and vegetables. ? Limit foods that are high in fat and sugar. These include fried or sweet foods. Activity  Exercise only as told by your doctor. Most people can do their usual exercise during pregnancy. Try to exercise for 30 minutes at least 5 days a week.  Stop exercising if you have pain or cramps in your belly or lower back.  Do not exercise if it is too hot or too humid, or if you are in a place of great height (high altitude).  Avoid heavy lifting.  If you choose to, you may have sex unless your doctor tells you not to. Relieving pain and discomfort  Wear a good support bra if your breasts   are sore.  Take warm water baths (sitz baths) to soothe pain or discomfort caused by hemorrhoids. Use hemorrhoid cream if your doctor approves.  Rest with your legs raised (elevated) if you have leg cramps or low back pain.  If you develop bulging veins in your legs: ? Wear support hose as told by your doctor. ? Raise your feet for 15 minutes, 3-4 times a day. ? Limit salt in your food. Safety  Wear your seat belt at all times when you are in a car.  Talk with your doctor if someone is hurting you or  yelling at you a lot. Lifestyle  Do not use hot tubs, steam rooms, or saunas.  Do not douche. Do not use tampons or scented sanitary pads.  Avoid cat litter boxes and soil used by cats. These carry germs that can harm your baby and can cause a loss of your baby by miscarriage or stillbirth.  Do not use herbal medicines, illegal drugs, or medicines that are not approved by your doctor. Do not drink alcohol.  Do not smoke or use any products that contain nicotine or tobacco. If you need help quitting, ask your doctor. General instructions  Keep all follow-up visits. This is important.  Ask your doctor about local prenatal classes.  Ask your doctor about the right foods to eat or for help finding a counselor. Where to find more information  American Pregnancy Association: americanpregnancy.org  American College of Obstetricians and Gynecologists: www.acog.org  Office on Women's Health: womenshealth.gov/pregnancy Contact a doctor if:  You have a headache that does not go away when you take medicine.  You have changes in how you see, or you see spots in front of your eyes.  You have mild cramps, pressure, or pain in your lower belly.  You continue to feel like you may vomit (nauseous), you vomit, or you have watery poop (diarrhea).  You have bad-smelling fluid coming from your vagina.  You have pain when you pee or your pee smells bad.  You have very bad swelling of your face, hands, ankles, feet, or legs.  You have a fever. Get help right away if:  You are leaking fluid from your vagina.  You have spotting or bleeding from your vagina.  You have very bad belly cramping or pain.  You have trouble breathing.  You have chest pain.  You faint.  You have not felt your baby move for the time period told by your doctor.  You have new or increased pain, swelling, or redness in an arm or leg. Summary  The second trimester of pregnancy is from week 13 through week 27  (months 4 through 6).  Eat healthy meals.  Exercise as told by your doctor. Most people can do their usual exercise during pregnancy.  Do not use herbal medicines, illegal drugs, or medicines that are not approved by your doctor. Do not drink alcohol.  Call your doctor if you get sick or if you notice anything unusual about your pregnancy. This information is not intended to replace advice given to you by your health care provider. Make sure you discuss any questions you have with your health care provider. Document Revised: 12/29/2019 Document Reviewed: 11/04/2019 Elsevier Patient Education  2021 [employer] Common Medications Safe in Pregnancy  Acne:      Constipation:  Benzoyl Peroxide     Colace  Clindamycin      Dulcolax Suppository  Topica Erythromycin     Fibercon  Salicylic Acid        Metamucil         Miralax AVOID:        Senakot   Accutane    Cough:  Retin-A       Cough Drops  Tetracycline      Phenergan w/ Codeine if Rx  Minocycline      Robitussin (Plain & DM)  Antibiotics:     Crabs/Lice:  Ceclor       RID  Cephalosporins    AVOID:  E-Mycins      Kwell  Keflex  Macrobid/Macrodantin   Diarrhea:  Penicillin      Kao-Pectate  Zithromax      Imodium AD         PUSH FLUIDS AVOID:       Cipro     Fever:  Tetracycline      Tylenol (Regular or Extra  Minocycline       Strength)  Levaquin      Extra Strength-Do not          Exceed 8 tabs/24 hrs Caffeine:        <200mg/day (equiv. To 1 cup of coffee or  approx. 3 12 oz sodas)         Gas: Cold/Hayfever:       Gas-X  Benadryl      Mylicon  Claritin       Phazyme  **Claritin-D        Chlor-Trimeton    Headaches:  Dimetapp      ASA-Free Excedrin  Drixoral-Non-Drowsy     Cold Compress  Mucinex (Guaifenasin)     Tylenol (Regular or Extra  Sudafed/Sudafed-12 Hour     Strength)  **Sudafed PE Pseudoephedrine   Tylenol Cold & Sinus     Vicks Vapor Rub  Zyrtec  **AVOID if Problems With Blood  Pressure         Heartburn: Avoid lying down for at least 1 hour after meals  Aciphex      Maalox     Rash:  Milk of Magnesia     Benadryl    Mylanta       1% Hydrocortisone Cream  Pepcid  Pepcid Complete   Sleep Aids:  Prevacid      Ambien   Prilosec       Benadryl  Rolaids       Chamomile Tea  Tums (Limit 4/day)     Unisom         Tylenol PM         Warm milk-add vanilla or  Hemorrhoids:       Sugar for taste  Anusol/Anusol H.C.  (RX: Analapram 2.5%)  Sugar Substitutes:  Hydrocortisone OTC     Ok in moderation  Preparation H      Tucks        Vaseline lotion applied to tissue with wiping    Herpes:     Throat:  Acyclovir      Oragel  Famvir  Valtrex     Vaccines:         Flu Shot Leg Cramps:       *Gardasil  Benadryl      Hepatitis A         Hepatitis B Nasal Spray:       Pneumovax  Saline Nasal Spray     Polio Booster         Tetanus Nausea:       Tuberculosis test or PPD  Vitamin B6 25 mg TID   AVOID:      Dramamine      *Gardasil  Emetrol       Live Poliovirus  Ginger Root 250 mg QID    MMR (measles, mumps &  High Complex Carbs @ Bedtime    rebella)  Sea Bands-Accupressure    Varicella (Chickenpox)  Unisom 1/2 tab TID     *No known complications           If received before Pain:         Known pregnancy;   Darvocet       Resume series after  Lortab        Delivery  Percocet    Yeast:   Tramadol      Femstat  Tylenol 3      Gyne-lotrimin  Ultram       Monistat  Vicodin           MISC:         All Sunscreens           Hair Coloring/highlights          Insect Repellant's          (Including DEET)         Mystic Tans

## 2020-10-12 NOTE — Progress Notes (Signed)
OBSTETRIC INITIAL PRENATAL VISIT  Subjective:    Marcia Hodges is being seen today for her first obstetrical visit.  This is a planned pregnancy. She is a 25 y.o. G21P2002 female at [redacted]w[redacted]d gestation, Estimated Date of Delivery: 04/19/21 with Patient's last menstrual period was 07/13/2020 (exact date),  consistent with 7 week sono. Her obstetrical history is significant for none. Relationship with FOB: spouse, living together. Patient does intend to breast feed. Pregnancy history fully reviewed.    OB History  Gravida Para Term Preterm AB Living  3 2 2  0 0 2  SAB IAB Ectopic Multiple Live Births  0 0 0 0 2    # Outcome Date GA Lbr Len/2nd Weight Sex Delivery Anes PTL Lv  3 Current           2 Term 11/26/18 [redacted]w[redacted]d / 00:09 7 lb 5.8 oz (3.34 kg) M Vag-Spont EPI  LIV     Name: ANETT, RANKER     Apgar1: 9  Apgar5: 9  1 Term 04/25/16 [redacted]w[redacted]d  7 lb 1.2 oz (3.21 kg) M Vag-Spont EPI  LIV     Name: SHAI, RASMUSSEN     Apgar1: 7  Apgar5: 9    Gynecologic History:  Last pap smear was 04/10/2016.  Results were normal.  Denies h/o abnormal pap smears in the past.  Denies history of STIs.  Contraception: prior to pregnancy had Nexplanon  Past Medical History:  Diagnosis Date  . 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 Allergic rhinitis   . Anxiety and depression   . Headache    migraine- w/o aura  . Hematuria     Family History  Problem Relation Age of Onset  . Hypertension Father   . Diabetes Neg Hx   . Cancer Neg Hx   . Hearing loss Neg Hx     Past Surgical History:  Procedure Laterality Date  . wisdom teeth pulled      Social History   Socioeconomic History  . Marital status: Single    Spouse name: Joey  . Number of children: Not on file  . Years of education: Not on file  . Highest education level: Not on file  Occupational History  . Not on file  Tobacco Use  . Smoking status: Never Smoker  .  Smokeless tobacco: Never Used  Vaping Use  . Vaping Use: Former  Substance and Sexual Activity  . Alcohol use: No    Alcohol/week: 0.0 standard drinks  . Drug use: No  . Sexual activity: Yes    Partners: Male    Birth control/protection: None  Other Topics Concern  . Not on file  Social History Narrative  . Not on file   Social Determinants of Health   Financial Resource Strain: Not on file  Food Insecurity: Not on file  Transportation Needs: Not on file  Physical Activity: Not on file  Stress: Not on file  Social Connections: Not on file  Intimate Partner Violence: Not on file    Current Outpatient Medications on File Prior to Visit  Medication Sig Dispense Refill  . Doxylamine-Pyridoxine 10-10 MG TBEC Take 10 mg by mouth in the morning and at bedtime. 60 tablet 0  . Prenatal Vit-Fe Fumarate-FA (MULTIVITAMIN-PRENATAL) 27-0.8 MG TABS tablet Take 1 tablet by mouth daily at 12 noon.     No current facility-administered medications on file prior to visit.    Allergies  Allergen  Reactions  . Femoxetine   . Ferumoxytol Rash     Review of Systems General: Not Present- Fever, Weight Loss and Weight Gain. Skin: Not Present- Rash. HEENT: Not Present- Blurred Vision, Headache and Bleeding Gums. Respiratory: Not Present- Difficulty Breathing. Breast: Not Present- Breast Mass. Cardiovascular: Not Present- Chest Pain, Elevated Blood Pressure, Fainting / Blacking Out and Shortness of Breath. Gastrointestinal: Not Present- Abdominal Pain, Constipation, Nausea and Vomiting. Female Genitourinary: Not Present- Frequency, Painful Urination, Pelvic Pain, Vaginal Bleeding, Vaginal Discharge, Contractions, regular, Fetal Movements Decreased, Urinary Complaints and Vaginal Fluid. Musculoskeletal: Not Present- Back Pain and Leg Cramps. Neurological: Not Present- Dizziness. Psychiatric: Not Present- Depression.     Objective:   Blood pressure 110/73, pulse 83, height 5\' 2"  (1.575 m),  weight 139 lb 14.4 oz (63.5 kg), last menstrual period 07/13/2020, not currently breastfeeding.  Body mass index is 25.59 kg/m.  General Appearance:    Alert, cooperative, no distress, appears stated age  Head:    Normocephalic, without obvious abnormality, atraumatic  Eyes:    PERRL, conjunctiva/corneas clear, EOM's intact, both eyes  Ears:    Normal external ear canals, both ears  Nose:   Nares normal, septum midline, mucosa normal, no drainage or sinus tenderness  Throat:   Lips, mucosa, and tongue normal; teeth and gums normal  Neck:   Supple, symmetrical, trachea midline, no adenopathy; thyroid: no enlargement/tenderness/nodules; no carotid bruit or JVD  Back:     Symmetric, no curvature, ROM normal, no CVA tenderness  Lungs:     Clear to auscultation bilaterally, respirations unlabored  Chest Wall:    No tenderness or deformity   Heart:    Regular rate and rhythm, S1 and S2 normal, no murmur, rub or gallop  Breast Exam:    No tenderness, masses, or nipple abnormality  Abdomen:     Soft, non-tender, bowel sounds active all four quadrants, no masses, no organomegaly.  FHT 160 bpm.  Genitalia:    Pelvic:external genitalia normal, vagina without lesions, discharge, or tenderness, rectovaginal septum  normal. Cervix normal in appearance, no cervical motion tenderness, no adnexal masses or tenderness.  Pregnancy positive findings: uterine enlargement: 13 wk size, nontender.   Rectal:    Normal external sphincter.  No hemorrhoids appreciated. Internal exam not done.   Extremities:   Extremities normal, atraumatic, no cyanosis or edema  Pulses:   2+ and symmetric all extremities  Skin:   Skin color, texture, turgor normal, no rashes or lesions  Lymph nodes:   Cervical, supraclavicular, and axillary nodes normal  Neurologic:   CNII-XII intact, normal strength, sensation and reflexes throughout      GAD 7 : Generalized Anxiety Score 10/12/2020  Nervous, Anxious, on Edge 0  Control/stop  worrying 1  Worry too much - different things 1  Trouble relaxing 0  Restless 0  Easily annoyed or irritable 1  Afraid - awful might happen 0  Total GAD 7 Score 3     Depression screen PHQ 2/9 10/12/2020  Decreased Interest 1  Down, Depressed, Hopeless 0  PHQ - 2 Score 1  Altered sleeping 0  Tired, decreased energy 1  Change in appetite 0  Feeling bad or failure about yourself  1  Trouble concentrating 0  Moving slowly or fidgety/restless 0  Suicidal thoughts 0  PHQ-9 Score 3    Assessment:   1. Encounter for supervision of other normal pregnancy in second trimester   2. [redacted] weeks gestation of pregnancy   3. Anxiety and depression  4. Cervical cancer screening     Plan:   1. Supervision of normal pregnancy  - Initial labs reviewed. - Pap smear performed today for cervical cancer screening.  - Prenatal vitamins encouraged. - Problem list reviewed and updated. - New OB counseling:  The patient has been given an overview regarding routine prenatal care.  Recommendations regarding diet, weight gain, and exercise in pregnancy were given. - Prenatal testing, optional genetic testing, and ultrasound use in pregnancy were reviewed.  Cell-free DNA testing also discussed: requested. Ordered Panorama. - Benefits of Breast Feeding were discussed. The patient is encouraged to consider nursing her baby post partum.  2. H/o anxiety and depression - Currently on no meds. Does have a h/o as well of postpartum depression. Baseline GAD and PHQ-9 performed today. Will monitor during the pregnancy and postpartum.   Follow up in 4 weeks.   The patient has Medicaid.  [employer] Medicaid Risk Screening Form completed today  50% of 30 min visit spent on counseling and coordination of care.     Hildred Laser, MD [facility]

## 2020-10-13 LAB — CBC
Hematocrit: 35.5 % (ref 34.0–46.6)
Hemoglobin: 11.9 g/dL (ref 11.1–15.9)
MCH: 27.5 pg (ref 26.6–33.0)
MCHC: 33.5 g/dL (ref 31.5–35.7)
MCV: 82 fL (ref 79–97)
Platelets: 299 10*3/uL (ref 150–450)
RBC: 4.32 x10E6/uL (ref 3.77–5.28)
RDW: 12.8 % (ref 11.7–15.4)
WBC: 8.3 10*3/uL (ref 3.4–10.8)

## 2020-10-17 LAB — CYTOLOGY - PAP: Diagnosis: NEGATIVE

## 2020-10-24 ENCOUNTER — Telehealth: Payer: Self-pay

## 2020-10-24 NOTE — Telephone Encounter (Signed)
Pt called no answer LM via Vm that her natera test results were completed and her baby was low risk. Pt was advised that if she would to know the sex of the baby to please contact the office. Pt is having a baby boy. Will send pt a mychart message.

## 2020-11-09 ENCOUNTER — Other Ambulatory Visit: Payer: Self-pay

## 2020-11-09 ENCOUNTER — Ambulatory Visit (INDEPENDENT_AMBULATORY_CARE_PROVIDER_SITE_OTHER): Payer: BC Managed Care – PPO | Admitting: Obstetrics and Gynecology

## 2020-11-09 ENCOUNTER — Encounter: Payer: Self-pay | Admitting: Obstetrics and Gynecology

## 2020-11-09 VITALS — BP 116/76 | HR 89 | Wt 142.7 lb

## 2020-11-09 DIAGNOSIS — Z3482 Encounter for supervision of other normal pregnancy, second trimester: Secondary | ICD-10-CM

## 2020-11-09 DIAGNOSIS — Z3A17 17 weeks gestation of pregnancy: Secondary | ICD-10-CM

## 2020-11-09 LAB — POCT URINALYSIS DIPSTICK OB
Bilirubin, UA: NEGATIVE
Blood, UA: NEGATIVE
Glucose, UA: NEGATIVE
Leukocytes, UA: NEGATIVE
Nitrite, UA: NEGATIVE
Spec Grav, UA: <=1.005 — AB (ref 1.010–1.025)
Urobilinogen, UA: 0.2 E.U./dL
pH, UA: 7 (ref 5.0–8.0)

## 2020-11-09 NOTE — Progress Notes (Signed)
ROB: No complaints.  Desires AFP.  Anatomy ultrasound scheduled.

## 2020-11-11 LAB — AFP, SERUM, OPEN SPINA BIFIDA
AFP MoM: 0.91
AFP Value: 37.5 ng/mL
Gest. Age on Collection Date: 17 weeks
Maternal Age At EDD: 26.6 yr
OSBR Risk 1 IN: 10000
Test Results:: NEGATIVE
Weight: 142 [lb_av]

## 2020-11-28 ENCOUNTER — Telehealth: Payer: Self-pay | Admitting: Obstetrics and Gynecology

## 2020-11-28 NOTE — Telephone Encounter (Signed)
Pt called no answer LM via VM to please contact the office to speak more about her concerns for calling the office. Will send a mychart message to pt.

## 2020-11-28 NOTE — Telephone Encounter (Signed)
Patient having pain pressure in pelvic since Sunday, pt has been resting and staying hydrated no improvements. Pain rated at 4 out of 10. Pt is requesting appoint today Please Advise.

## 2020-11-28 NOTE — Telephone Encounter (Signed)
Spoke to pt concerning her call to the office. Pt stated having pain in her back and tightness in her belly and pain in her legs. Pt was asked if she think she is having braxton hick contractions. Pt statetd that it feels like it but she think that it is too early for that. Pt was informed that it is not to early for braxton hick contractions. While speaking to the pt I asked her if she was up moving around a lot, and staying hydrated. Pt stated that she had her son's birthday party over the weekend and noticed the pain a day after the party. Pt was given information on signs of labor or concerns to go the ED or contact the office. Pt voiced that she understood.

## 2020-12-07 ENCOUNTER — Encounter: Payer: Self-pay | Admitting: Obstetrics and Gynecology

## 2020-12-07 ENCOUNTER — Other Ambulatory Visit: Payer: Self-pay

## 2020-12-07 ENCOUNTER — Ambulatory Visit (INDEPENDENT_AMBULATORY_CARE_PROVIDER_SITE_OTHER): Payer: Medicaid Other | Admitting: Obstetrics and Gynecology

## 2020-12-07 VITALS — BP 127/86 | HR 104 | Ht 62.0 in | Wt 145.3 lb

## 2020-12-07 DIAGNOSIS — R102 Pelvic and perineal pain: Secondary | ICD-10-CM

## 2020-12-07 DIAGNOSIS — Z308 Encounter for other contraceptive management: Secondary | ICD-10-CM

## 2020-12-07 DIAGNOSIS — O26892 Other specified pregnancy related conditions, second trimester: Secondary | ICD-10-CM

## 2020-12-07 DIAGNOSIS — Z3A21 21 weeks gestation of pregnancy: Secondary | ICD-10-CM

## 2020-12-07 DIAGNOSIS — O479 False labor, unspecified: Secondary | ICD-10-CM

## 2020-12-07 DIAGNOSIS — Z3482 Encounter for supervision of other normal pregnancy, second trimester: Secondary | ICD-10-CM

## 2020-12-07 LAB — POCT URINALYSIS DIPSTICK OB
Bilirubin, UA: NEGATIVE
Blood, UA: NEGATIVE
Glucose, UA: NEGATIVE
Ketones, UA: NEGATIVE
Nitrite, UA: NEGATIVE
POC,PROTEIN,UA: NEGATIVE
Spec Grav, UA: <=1.005 — AB (ref 1.010–1.025)
Urobilinogen, UA: 0.2 E.U./dL
pH, UA: 7.5 (ref 5.0–8.0)

## 2020-12-07 NOTE — Patient Instructions (Signed)
Postpartum Tubal Ligation Postpartum tubal ligation (PPTL) is a procedure to close the fallopian tubes. This is done to prevent pregnancy. When the fallopian tubes are closed, the eggs that the ovaries release cannot enter the uterus, and sperm cannot reach the eggs. PPTL is done right after childbirth or 1-2 days after childbirth, before the uterus returns to its normal position. If you have a cesarean section, it can be performed at the same time as the procedure. Having this done after childbirth does not make your stay in the hospital longer. PPTL is sometimes called "getting your tubes tied." You should not have this procedure if you want to get pregnant again or if you are unsure about having more children. Tell a health care provider about:  Any allergies you have.  All medicines you are taking, including vitamins, herbs, eye drops, creams, and over-the-counter medicines.  Any problems you or family members have had with anesthetic medicines.  Any blood disorders you have.  Any surgeries you have had.  Any medical conditions you have or have had.  Any past pregnancies. What are the risks? Generally, this is a safe procedure. However, problems may occur, including:  Infection.  Bleeding.  Damage to other organs in the abdomen.  Side effects from anesthetic medicines.  Failure of the procedure. If this happens, you could get pregnant.  Ectopic pregnancy. This is a pregnancy in which the egg attaches outside the uterus. What happens before the procedure? Ask your health care provider about:  How much pain you can expect to have.  What medicines you will be given for pain, especially if you are breastfeeding. What happens during the procedure? If you had a vaginal delivery:  An IV will be inserted into one of your veins.  You will be given one or more of the following: ? A medicine to help you relax (sedative). ? A medicine to numb the area (local anesthetic). ? A  medicine to make you fall asleep (general anesthetic). ? A medicine that is injected into an area of your body to numb everything below the injection site (regional anesthetic).  If you have been given a general anesthetic, a tube will be put down your throat to help you breathe.  Your bladder may be emptied with a small tube (catheter).  An incision will be made just below your belly button.  Your fallopian tubes will be located and brought up through the incision.  Your fallopian tubes will be tied off, burned (cauterized), or blocked with a clip, ring, or clamp. A small part in the center of each fallopian tube may be removed.  The incision will be closed with stitches (sutures).  A bandage (dressing) will be placed over the incision. If you had a cesarean delivery:  Tubal ligation will be done through the incision that was used for the cesarean delivery of your baby.  The incision will be closed with sutures.  A dressing will be placed over the incision. The procedure may vary among health care providers and hospitals.   What happens after the procedure?  Your blood pressure, heart rate, breathing rate, and blood oxygen level will be monitored until you leave the hospital.  You will be given pain medicine as needed.  You will be encouraged to get up early and walk to prevent blood clots.  If you were given a sedative during the procedure, it can affect you for several hours. Do not drive or operate machinery until your health care provider says  that it is safe. Summary  Postpartum tubal ligation is a procedure that closes the fallopian tubes to prevent pregnancy.  This procedure is done while you are still in the hospital after childbirth. If you have a cesarean section, it can be performed at the same time.  Having this done after childbirth does not make your stay in the hospital longer.  Postpartum tubal ligation is considered permanent. You should not have this  procedure if you want to get pregnant again or if you are unsure about having more children.  Talk to your health care provider to see if this procedure is right for you. This information is not intended to replace advice given to you by your health care provider. Make sure you discuss any questions you have with your health care provider. Document Revised: 04/07/2020 Document Reviewed: 04/07/2020 Elsevier Patient Education  2021 [employer].

## 2020-12-07 NOTE — Progress Notes (Signed)
OB-Pt present for routien prenatal care. Pt stated no fetal movement, mild cramping, braxton hick contractions, lower abd pressure and tighten in the abd area.

## 2020-12-07 NOTE — Progress Notes (Signed)
ROB: Patient complains of not feeling fetal movement yet. Notes Deberah Pelton and pelvic pressure. Advised on belly band and increasing hydration. Considering BTL for contraception. Discussed risks, benefits, and alternatives. Patient will think over options. Discussed breastfeeding.   The following were addressed during this visit:  Breastfeeding Education - Early initiation of breastfeeding    Comments: Keeps milk supply adequate, helps contract uterus and slow bleeding, and early milk is the perfect first food and is easy to digest.   - The importance of exclusive breastfeeding    Comments: Provides antibodies, Lower risk of breast and ovarian cancers, and type-2 diabetes,Helps your body recover, Reduced chance of SIDS.   - Effective positioning and attachment    Comments: Helps my baby to get enough breast milk, helps to produce an adequate milk supply, and helps prevent nipple pain and damage   - Exclusive breastfeeding for the first 6 months    Comments: Builds a healthy milk supply and keeps it up, protects baby from sickness and disease, and breastmilk has everything your baby needs for the first 6 months.

## 2020-12-11 ENCOUNTER — Other Ambulatory Visit: Payer: Self-pay

## 2020-12-11 ENCOUNTER — Ambulatory Visit
Admission: RE | Admit: 2020-12-11 | Discharge: 2020-12-11 | Disposition: A | Discharge status: Home / Self Care | Payer: BC Managed Care – PPO | Source: Ambulatory Visit | Attending: Obstetrics and Gynecology | Admitting: Obstetrics and Gynecology

## 2020-12-11 DIAGNOSIS — Z3482 Encounter for supervision of other normal pregnancy, second trimester: Secondary | ICD-10-CM | POA: Diagnosis not present

## 2021-01-02 ENCOUNTER — Ambulatory Visit: Payer: BC Managed Care – PPO

## 2021-01-04 ENCOUNTER — Other Ambulatory Visit: Payer: Self-pay

## 2021-01-04 ENCOUNTER — Ambulatory Visit (INDEPENDENT_AMBULATORY_CARE_PROVIDER_SITE_OTHER): Payer: Medicaid Other | Admitting: Obstetrics and Gynecology

## 2021-01-04 ENCOUNTER — Encounter: Payer: Self-pay | Admitting: Obstetrics and Gynecology

## 2021-01-04 VITALS — BP 111/73 | HR 98 | Wt 150.0 lb

## 2021-01-04 DIAGNOSIS — Z3A25 25 weeks gestation of pregnancy: Secondary | ICD-10-CM

## 2021-01-04 DIAGNOSIS — Z3482 Encounter for supervision of other normal pregnancy, second trimester: Secondary | ICD-10-CM

## 2021-01-04 LAB — POCT URINALYSIS DIPSTICK OB
Bilirubin, UA: NEGATIVE
Blood, UA: NEGATIVE
Glucose, UA: NEGATIVE
Ketones, UA: NEGATIVE
Leukocytes, UA: NEGATIVE
Nitrite, UA: NEGATIVE
POC,PROTEIN,UA: NEGATIVE
Spec Grav, UA: 1.01 (ref 1.010–1.025)
Urobilinogen, UA: 0.2 E.U./dL
pH, UA: 6.5 (ref 5.0–8.0)

## 2021-01-04 NOTE — Progress Notes (Signed)
ROB: No complaints.  Reports daily movement.  1 hour GCT next visit.

## 2021-01-23 NOTE — Patient Instructions (Addendum)
Third Trimester of Pregnancy  The third trimester of pregnancy is from week 28 through week 59. This is also called months 7 through 9. This trimester is when your unborn baby (fetus) is growing very fast. At the end of the ninth month, the unborn baby is about20 inches long. It weighs about 6-10 pounds. Body changes during your third trimester Your body continues to go through many changes during this time. The changesvary and generally return to normal after the baby is born. Physical changes Your weight will continue to increase. You may gain 25-35 pounds (11-16 kg) by the end of the pregnancy. If you are underweight, you may gain 28-40 lb (about 13-18 kg). If you are overweight, you may gain 15-25 lb (about 7-11 kg). You may start to get stretch marks on your hips, belly (abdomen), and breasts. Your breasts will continue to grow and may hurt. A yellow fluid (colostrum) may leak from your breasts. This is the first milk you are making for your baby. You may have changes in your hair. Your belly button may stick out. You may have more swelling in your hands, face, or ankles. Health changes You may have heartburn. You may have trouble pooping (constipation). You may get hemorrhoids. These are swollen veins in the butt that can itch or get painful. You may have swollen veins (varicose veins) in your legs. You may have more body aches in the pelvis, back, or thighs. You may have more tingling or numbness in your hands, arms, and legs. The skin on your belly may also feel numb. You may feel short of breath as your womb (uterus) gets bigger. Other changes You may pee (urinate) more often. You may have more problems sleeping. You may notice the unborn baby "dropping," or moving lower in your belly. You may have more discharge coming from your vagina. Your joints may feel loose, and you may have pain around your pelvic bone. Follow these instructions at home: Medicines Take over-the-counter  and prescription medicines only as told by your doctor. Some medicines are not safe during pregnancy. Take a prenatal vitamin that contains at least 600 micrograms (mcg) of folic acid. Eating and drinking Eat healthy meals that include: Fresh fruits and vegetables. Whole grains. Good sources of protein, such as meat, eggs, or tofu. Low-fat dairy products. Avoid raw meat and unpasteurized juice, milk, and cheese. These carry germs that can harm you and your baby. Eat 4 or 5 small meals rather than 3 large meals a day. You may need to take these actions to prevent or treat trouble pooping: Drink enough fluids to keep your pee (urine) pale yellow. Eat foods that are high in fiber. These include beans, whole grains, and fresh fruits and vegetables. Limit foods that are high in fat and sugar. These include fried or sweet foods. Activity Exercise only as told by your doctor. Stop exercising if you start to have cramps in your womb. Avoid heavy lifting. Do not exercise if it is too hot or too humid, or if you are in a place of great height (high altitude). If you choose to, you may have sex unless your doctor tells you not to. Relieving pain and discomfort Take breaks often, and rest with your legs raised (elevated) if you have leg cramps or low back pain. Take warm water baths (sitz baths) to soothe pain or discomfort caused by hemorrhoids. Use hemorrhoid cream if your doctor approves. Wear a good support bra if your breasts are tender. If  you develop bulging, swollen veins in your legs: Wear support hose as told by your doctor. Raise your feet for 15 minutes, 3-4 times a day. Limit salt in your food. Safety Talk to your doctor before traveling far distances. Do not use hot tubs, steam rooms, or saunas. Wear your seat belt at all times when you are in a car. Talk with your doctor if someone is hurting you or yelling at you a lot. Preparing for your baby's arrival To prepare for the arrival  of your baby: Take prenatal classes. Visit the hospital and tour the maternity area. Buy a rear-facing car seat. Learn how to install it in your car. Prepare the baby's room. Take out all pillows and stuffed animals from the baby's crib. General instructions Avoid cat litter boxes and soil used by cats. These carry germs that can cause harm to the baby and can cause a loss of your baby by miscarriage or stillbirth. Do not douche or use tampons. Do not use scented sanitary pads. Do not smoke or use any products that contain nicotine or tobacco. If you need help quitting, ask your doctor. Do not drink alcohol. Do not use herbal medicines, illegal drugs, or medicines that were not approved by your doctor. Chemicals in these products can affect your baby. Keep all follow-up visits. This is important. Where to find more information American Pregnancy Association: americanpregnancy.org [employer]: www.acog.org Office on Women's Health: KeywordPortfolios.com.br Contact a doctor if: You have a fever. You have mild cramps or pressure in your lower belly. You have a nagging pain in your belly area. You vomit, or you have watery poop (diarrhea). You have bad-smelling fluid coming from your vagina. You have pain when you pee, or your pee smells bad. You have a headache that does not go away when you take medicine. You have changes in how you see, or you see spots in front of your eyes. Get help right away if: Your water breaks. You have regular contractions that are less than 5 minutes apart. You are spotting or bleeding from your vagina. You have very bad belly cramps or pain. You have trouble breathing. You have chest pain. You faint. You have not felt the baby move for the amount of time told by your doctor. You have new or increased pain, swelling, or redness in an arm or leg. Summary The third trimester is from week 28 through week 40 (months 7  through 9). This is the time when your unborn baby is growing very fast. During this time, your discomfort may increase as you gain weight and as your baby grows. Get ready for your baby to arrive by taking prenatal classes, buying a rear-facing car seat, and preparing the baby's room. Get help right away if you are bleeding from your vagina, you have chest pain and trouble breathing, or you have not felt the baby move for the amount of time told by your doctor. This information is not intended to replace advice given to you by your health care provider. Make sure you discuss any questions you have with your healthcare provider. Document Revised: 12/29/2019 Document Reviewed: 11/04/2019 Elsevier Patient Education  2022 [place].   Postpartum Tubal Ligation Postpartum tubal ligation (PPTL) is a procedure to close the fallopian tubes. This is done to prevent pregnancy. When the fallopian tubes are closed, the eggs that the ovaries release cannot enter the uterus, and sperm cannot reach the eggs. PPTL is done right after  childbirth or 1-2 days after childbirth, before the uterus returns to its normal position. If you have a cesarean section, it can be performed at the same time as the procedure. Having thisdone after childbirth does not make your stay in the hospital longer. PPTL is sometimes called "getting your tubes tied." You should not have this procedure if you want to get pregnant again or if you are unsure about havingmore children. Tell a health care provider about: Any allergies you have. All medicines you are taking, including vitamins, herbs, eye drops, creams, and over-the-counter medicines. Any problems you or family members have had with anesthetic medicines. Any blood disorders you have. Any surgeries you have had. Any medical conditions you have or have had. Any past pregnancies. What are the risks? Generally, this is a safe procedure. However, problems may occur,  including: Infection. Bleeding. Damage to other organs in the abdomen. Side effects from anesthetic medicines. Failure of the procedure. If this happens, you could get pregnant. Ectopic pregnancy. This is a pregnancy in which the egg attaches outside the uterus. What happens before the procedure? Ask your health care provider about: How much pain you can expect to have. What medicines you will be given for pain, especially if you are breastfeeding. What happens during the procedure? If you had a vaginal delivery: An IV will be inserted into one of your veins. You will be given one or more of the following: A medicine to help you relax (sedative). A medicine to numb the area (local anesthetic). A medicine to make you fall asleep (general anesthetic). A medicine that is injected into an area of your body to numb everything below the injection site (regional anesthetic). If you have been given a general anesthetic, a tube will be put down your throat to help you breathe. Your bladder may be emptied with a small tube (catheter). An incision will be made just below your belly button. Your fallopian tubes will be located and brought up through the incision. Your fallopian tubes will be tied off, burned (cauterized), or blocked with a clip, ring, or clamp. A small part in the center of each fallopian tube may be removed. The incision will be closed with stitches (sutures). A bandage (dressing) will be placed over the incision. If you had a cesarean delivery: Tubal ligation will be done through the incision that was used for the cesarean delivery of your baby. The incision will be closed with sutures. A dressing will be placed over the incision. The procedure may vary among health care providers and hospitals. What happens after the procedure? Your blood pressure, heart rate, breathing rate, and blood oxygen level will be monitored until you leave the hospital. You will be given pain  medicine as needed. You will be encouraged to get up early and walk to prevent blood clots. If you were given a sedative during the procedure, it can affect you for several hours. Do not drive or operate machinery until your health care provider says that it is safe. Summary Postpartum tubal ligation is a procedure that closes the fallopian tubes to prevent pregnancy. This procedure is done while you are still in the hospital after childbirth. If you have a cesarean section, it can be performed at the same time. Having this done after childbirth does not make your stay in the hospital longer. Postpartum tubal ligation is considered permanent. You should not have this procedure if you want to get pregnant again or if you are unsure about having  more children. Talk to your health care provider to see if this procedure is right for you. This information is not intended to replace advice given to you by your health care provider. Make sure you discuss any questions you have with your healthcare provider. Document Revised: 04/07/2020 Document Reviewed: 04/07/2020 Elsevier Patient Education  [place].   Common Medications Safe in Pregnancy  Acne:      Constipation:  Benzoyl Peroxide     Colace  Clindamycin      Dulcolax Suppository  Topica Erythromycin     Fibercon  Salicylic Acid      Metamucil         Miralax AVOID:        Senakot   Accutane    Cough:  Retin-A       Cough Drops  Tetracycline      Phenergan w/ Codeine if Rx  Minocycline      Robitussin (Plain & DM)  Antibiotics:     Crabs/Lice:  Ceclor       RID  Cephalosporins    AVOID:  E-Mycins      Kwell  Keflex  Macrobid/Macrodantin   Diarrhea:  Penicillin      Kao-Pectate  Zithromax      Imodium AD         PUSH FLUIDS AVOID:       Cipro     Fever:  Tetracycline      Tylenol (Regular or Extra  Minocycline       Strength)  Levaquin      Extra Strength-Do not          Exceed 8 tabs/24 hrs Caffeine:        '200mg'$ /day  (equiv. To 1 cup of coffee or  approx. 3 12 oz sodas)         Gas: Cold/Hayfever:       Gas-X  Benadryl      Mylicon  Claritin       Phazyme  **Claritin-D        Chlor-Trimeton    Headaches:  Dimetapp      ASA-Free Excedrin  Drixoral-Non-Drowsy     Cold Compress  Mucinex (Guaifenasin)     Tylenol (Regular or Extra  Sudafed/Sudafed-12 Hour     Strength)  **Sudafed PE Pseudoephedrine   Tylenol Cold & Sinus     Vicks Vapor Rub  Zyrtec  **AVOID if Problems With Blood Pressure         Heartburn: Avoid lying down for at least 1 hour after meals  Aciphex      Maalox     Rash:  Milk of Magnesia     Benadryl    Mylanta       1% Hydrocortisone Cream  Pepcid  Pepcid Complete   Sleep Aids:  Prevacid      Ambien   Prilosec       Benadryl  Rolaids       Chamomile Tea  Tums (Limit 4/day)     Unisom         Tylenol PM         Warm milk-add vanilla or  Hemorrhoids:       Sugar for taste  Anusol/Anusol H.C.  (RX: Analapram 2.5%)  Sugar Substitutes:  Hydrocortisone OTC     Ok in moderation  Preparation H      Tucks        Vaseline lotion applied to tissue with wiping    Herpes:  Throat:  Acyclovir      Oragel  Famvir  Valtrex     Vaccines:         Flu Shot Leg Cramps:       *Gardasil  Benadryl      Hepatitis A         Hepatitis B Nasal Spray:       Pneumovax  Saline Nasal Spray     Polio Booster         Tetanus Nausea:       Tuberculosis test or PPD  Vitamin B6 25 mg TID   AVOID:    Dramamine      *Gardasil  Emetrol       Live Poliovirus  Ginger Root 250 mg QID    MMR (measles, mumps &  High Complex Carbs @ Bedtime    rebella)  Sea Bands-Accupressure    Varicella (Chickenpox)  Unisom 1/2 tab TID     *No known complications           If received before Pain:         Known pregnancy;   Darvocet       Resume series after  Lortab        Delivery  Percocet    Yeast:   Tramadol      Femstat  Tylenol  3      Gyne-lotrimin  Ultram       Monistat  Vicodin           MISC:         All Sunscreens           Hair Coloring/highlights          Insect Repellant's          (Including DEET)         Mystic Tans     Tdap (Tetanus, Diphtheria, Pertussis) Vaccine: What You Need to Know 1. Why get vaccinated? Tdap vaccine can prevent tetanus, diphtheria, and pertussis. Diphtheria and pertussis spread from person to person. Tetanus enters the body through cuts or wounds. TETANUS (T) causes painful stiffening of the muscles. Tetanus can lead to serious health problems, including being unable to open the mouth, having trouble swallowing and breathing, or death. DIPHTHERIA (D) can lead to difficulty breathing, heart failure, paralysis, or death. PERTUSSIS (aP), also known as "whooping cough," can cause uncontrollable, violent coughing that makes it hard to breathe, eat, or drink. Pertussis can be extremely serious especially in babies and young children, causing pneumonia, convulsions, brain damage, or death. In teens and adults, it can cause weight loss, loss of bladder control, passing out, and rib fractures from severe coughing. 2. Tdap vaccine Tdap is only for children 7 years and older, adolescents, and adults.  Adolescents should receive a single dose of Tdap, preferably at age 69 or 52 years. Pregnant people should get a dose of Tdap during every pregnancy, preferably during the early part of the third trimester, to help protect the newborn from pertussis. Infants are most at risk for severe, life-threatening complications frompertussis. Adults who have never received Tdap should get a dose of Tdap. Also, adults should receive a booster dose of either Tdap or Td (a different vaccine that protects against tetanus and diphtheria but not pertussis) every 10 years, or after 5 years in the case of a severe or dirty wound or burn. Tdap may be given at the same time as other vaccines. 3. Talk with your  health care provider Tell your  vaccine provider if the person getting the vaccine: Has had an allergic reaction after a previous dose of any vaccine that protects against tetanus, diphtheria, or pertussis, or has any severe, life-threatening allergies Has had a coma, decreased level of consciousness, or prolonged seizures within 7 days after a previous dose of any pertussis vaccine (DTP, DTaP, or Tdap) Has seizures or another nervous system problem Has ever had Guillain-Barr Syndrome (also called "GBS") Has had severe pain or swelling after a previous dose of any vaccine that protects against tetanus or diphtheria In some cases, your health care provider may decide to postpone Tdapvaccination until a future visit. People with minor illnesses, such as a cold, may be vaccinated. People who are moderately or severely ill should usually wait until they recover beforegetting Tdap vaccine.  Your health care provider can give you more information. 4. Risks of a vaccine reaction Pain, redness, or swelling where the shot was given, mild fever, headache, feeling tired, and nausea, vomiting, diarrhea, or stomachache sometimes happen after Tdap vaccination. People sometimes faint after medical procedures, including vaccination. Tellyour provider if you feel dizzy or have vision changes or ringing in the ears.  As with any medicine, there is a very remote chance of a vaccine causing asevere allergic reaction, other serious injury, or death. 5. What if there is a serious problem? An allergic reaction could occur after the vaccinated person leaves the clinic. If you see signs of a severe allergic reaction (hives, swelling of the face and throat, difficulty breathing, a fast heartbeat, dizziness, or weakness), call [id number] get the person to the nearest hospital. For other signs that concern you, call your health care provider.  Adverse reactions should be reported to the Vaccine Adverse Event Reporting System  (VAERS). Your health care provider will usually file this report, or you can do it yourself. Visit the VAERS website at www.vaers.SamedayNews.es or call 226-412-3405. VAERS is only for reporting reactions, and VAERS staff members do not give medical advice. 6. The National Vaccine Injury Compensation Program The [employer] Vaccine Injury Compensation Program (VICP) is a federal program that was created to compensate people who may have been injured by certain vaccines. Claims regarding alleged injury or death due to vaccination have a time limit for filing, which may be as short as two years. Visit the VICP website at GoldCloset.com.ee or call 917 400 3619to learn about the program and about filing a claim. 7. How can I learn more? Ask your health care provider. Call your local or state health department. Visit the website of the Food and Drug Administration (FDA) for vaccine package inserts and additional information at TraderRating.uy. Contact the Centers for Disease Control and Prevention (CDC): Call 678-530-4327 (1-800-CDC-INFO) or Visit CDC's website at http://hunter.com/. Vaccine Information Statement Tdap (Tetanus, Diphtheria, Pertussis) Vaccine(03/10/2020) This information is not intended to replace advice given to you by your health care provider. Make sure you discuss any questions you have with your healthcare provider. Document Revised: 04/05/2020 Document Reviewed: 04/05/2020 Elsevier Patient Education  2022 [employer].

## 2021-01-24 ENCOUNTER — Ambulatory Visit (INDEPENDENT_AMBULATORY_CARE_PROVIDER_SITE_OTHER): Payer: BC Managed Care – PPO | Admitting: Obstetrics and Gynecology

## 2021-01-24 ENCOUNTER — Other Ambulatory Visit: Payer: Self-pay

## 2021-01-24 ENCOUNTER — Other Ambulatory Visit: Payer: Medicaid Other

## 2021-01-24 ENCOUNTER — Encounter: Payer: Self-pay | Admitting: Obstetrics and Gynecology

## 2021-01-24 VITALS — BP 109/71 | HR 93 | Wt 152.2 lb

## 2021-01-24 DIAGNOSIS — Z23 Encounter for immunization: Secondary | ICD-10-CM | POA: Diagnosis not present

## 2021-01-24 DIAGNOSIS — Z3482 Encounter for supervision of other normal pregnancy, second trimester: Secondary | ICD-10-CM

## 2021-01-24 DIAGNOSIS — Z308 Encounter for other contraceptive management: Secondary | ICD-10-CM

## 2021-01-24 DIAGNOSIS — Z3A27 27 weeks gestation of pregnancy: Secondary | ICD-10-CM

## 2021-01-24 LAB — POCT URINALYSIS DIPSTICK OB
Bilirubin, UA: NEGATIVE
Blood, UA: NEGATIVE
Glucose, UA: NEGATIVE
Ketones, UA: NEGATIVE
Nitrite, UA: NEGATIVE
POC,PROTEIN,UA: NEGATIVE
Spec Grav, UA: 1.01 (ref 1.010–1.025)
Urobilinogen, UA: 0.2 E.U./dL
pH, UA: 7 (ref 5.0–8.0)

## 2021-01-24 MED ORDER — TETANUS-DIPHTH-ACELL PERTUSSIS 5-2.5-18.5 LF-MCG/0.5 IM SUSY
0.5000 mL | PREFILLED_SYRINGE | Freq: Once | INTRAMUSCULAR | Status: AC
  Administered 2021-01-24: 0.5 mL via INTRAMUSCULAR

## 2021-01-24 NOTE — Progress Notes (Signed)
ROB: Patient doing well. For 28 week labs today.  Desires to breastfeed,  Desires BTL for contraception. Has been counseled on other forms of contraception. Medicaid form signed. For Tdap today, signed blood consent. RTC in 2 week.s   The following were addressed during this visit:  Breastfeeding Education - Risks of giving your baby anything other than breast milk if you are breastfeeding    Comments: Make the baby less content with breastfeeds, may make my baby more susceptible to illness, and may reduce my milk supply.   - Nonpharmacological pain relief methods for labor    Comments: Deep breathing, focusing on pleasant things, movement and walking, heating pads or cold compress, massage and relaxation, continuous support from someone you trust, and Doulas   - The importance of early skin-to-skin contact    Comments:  Keeps baby warm and secure, helps keep baby's blood sugar up and breathing steady, easier to bond and breastfeed, and helps calm baby.  - Rooming-in on a 24-hour basis    Comments: Easier to learn baby's feeding cues, easier to bond and get to know each other, and encourages milk production.   - Feeding on demand or baby-led feeding    Comments: Helps prevent breastfeeding complications, helps bring in good milk supply, prevents under or overfeeding, and helps baby feel content and satisfied   - Frequent feeding to help assure optimal milk production    Comments: Making a full supply of milk requires frequent removal of milk from breasts, infant will eat 8-12 times in 24 hours, if separated from infant use breast massage, hand expression and/ or pumping to remove milk from breasts.   - Individualized Education    Comments: Contraindications to breastfeeding and other special medical conditions

## 2021-01-24 NOTE — Progress Notes (Signed)
  ROB-PT stated that she is doing well. BTC and Tdap completed.

## 2021-01-25 LAB — CBC WITH DIFF/PLATELET
Basophils Absolute: 0 10*3/uL (ref 0.0–0.2)
Basos: 0 %
EOS (ABSOLUTE): 0.1 10*3/uL (ref 0.0–0.4)
Eos: 1 %
Hematocrit: 31.5 % — ABNORMAL LOW (ref 34.0–46.6)
Hemoglobin: 9.9 g/dL — ABNORMAL LOW (ref 11.1–15.9)
Immature Grans (Abs): 0.1 10*3/uL (ref 0.0–0.1)
Immature Granulocytes: 1 %
Lymphocytes Absolute: 1.4 10*3/uL (ref 0.7–3.1)
Lymphs: 16 %
MCH: 24.3 pg — ABNORMAL LOW (ref 26.6–33.0)
MCHC: 31.4 g/dL — ABNORMAL LOW (ref 31.5–35.7)
MCV: 77 fL — ABNORMAL LOW (ref 79–97)
Monocytes Absolute: 0.4 10*3/uL (ref 0.1–0.9)
Monocytes: 5 %
Neutrophils Absolute: 6.9 10*3/uL (ref 1.4–7.0)
Neutrophils: 77 %
Platelets: 313 10*3/uL (ref 150–450)
RBC: 4.08 x10E6/uL (ref 3.77–5.28)
RDW: 13.2 % (ref 11.7–15.4)
WBC: 8.9 10*3/uL (ref 3.4–10.8)

## 2021-01-25 LAB — GLUCOSE TOLERANCE, 1 HOUR: Glucose, 1Hr PP: 179 mg/dL (ref 65–199)

## 2021-01-25 LAB — RPR: RPR Ser Ql: NONREACTIVE

## 2021-01-26 ENCOUNTER — Telehealth: Payer: Self-pay

## 2021-01-26 MED ORDER — FUSION PLUS PO CAPS
65.0000 mg | ORAL_CAPSULE | Freq: Two times a day (BID) | ORAL | 6 refills | Status: DC
Start: 2021-01-26 — End: 2021-06-12

## 2021-01-26 NOTE — Telephone Encounter (Signed)
-----   Message from Linzie Collin, MD sent at 01/25/2021  3:19 PM EDT ----- Low Hgb - needs Fe BID with meals

## 2021-01-28 ENCOUNTER — Other Ambulatory Visit: Payer: Self-pay

## 2021-01-28 ENCOUNTER — Observation Stay
Admission: EM | Admit: 2021-01-28 | Discharge: 2021-01-28 | Disposition: A | Discharge status: Home / Self Care | Payer: BC Managed Care – PPO | Attending: Obstetrics and Gynecology | Admitting: Obstetrics and Gynecology

## 2021-01-28 ENCOUNTER — Encounter: Payer: Self-pay | Admitting: Obstetrics and Gynecology

## 2021-01-28 DIAGNOSIS — R1084 Generalized abdominal pain: Secondary | ICD-10-CM

## 2021-01-28 DIAGNOSIS — Z3A28 28 weeks gestation of pregnancy: Secondary | ICD-10-CM | POA: Diagnosis not present

## 2021-01-28 DIAGNOSIS — Z3480 Encounter for supervision of other normal pregnancy, unspecified trimester: Secondary | ICD-10-CM

## 2021-01-28 DIAGNOSIS — O26899 Other specified pregnancy related conditions, unspecified trimester: Secondary | ICD-10-CM

## 2021-01-28 DIAGNOSIS — R109 Unspecified abdominal pain: Secondary | ICD-10-CM | POA: Insufficient documentation

## 2021-01-28 DIAGNOSIS — O26893 Other specified pregnancy related conditions, third trimester: Principal | ICD-10-CM | POA: Insufficient documentation

## 2021-01-28 DIAGNOSIS — O368331 Maternal care for abnormalities of the fetal heart rate or rhythm, third trimester, fetus 1: Secondary | ICD-10-CM | POA: Insufficient documentation

## 2021-01-28 DIAGNOSIS — Z3490 Encounter for supervision of normal pregnancy, unspecified, unspecified trimester: Secondary | ICD-10-CM

## 2021-01-28 HISTORY — DX: Other specified health status: Z78.9

## 2021-01-28 NOTE — Discharge Summary (Signed)
    L&D OB Triage Note  SUBJECTIVE Marcia Hodges is a 25 y.o. [id number] female at [redacted]w[redacted]d, EDD Estimated Date of Delivery: 04/19/21 who presented to triage with complaints of abdominal cramping. Denies bleeding, leakage of fluid.  Reports fetal movement.   OB History  Gravida Para Term Preterm AB Living  3 2 2  0 0 2  SAB IAB Ectopic Multiple Live Births  0 0 0 0 2    # Outcome Date GA Lbr Len/2nd Weight Sex Delivery Anes PTL Lv  3 Current           2 Term 11/26/18 [redacted]w[redacted]d / 00:09 3340 g M Vag-Spont EPI  LIV     Name: Marcia Hodges     Apgar1: 9  Apgar5: 9  1 Term 04/25/16 [redacted]w[redacted]d  3210 g M Vag-Spont EPI  LIV     Name: [redacted]w[redacted]d     Apgar1: 7  Apgar5: 9    Medications Prior to Admission  Medication Sig Dispense Refill Last Dose   Prenatal Vit-Fe Fumarate-FA (MULTIVITAMIN-PRENATAL) 27-0.8 MG TABS tablet Take 1 tablet by mouth daily at 12 noon.   01/27/2021   Iron-FA-B Cmp-C-Biot-Probiotic (FUSION PLUS) CAPS Take 65 mg by mouth 2 (two) times daily with a meal. 30 capsule 6      OBJECTIVE  Nursing Evaluation:   BP 109/71 (BP Location: Left Arm)   Pulse 85   Temp 98 F (36.7 C) (Oral)   Resp 16   Ht 5\' 2"  (1.575 m)   Wt 68 kg   LMP 07/13/2020 (Exact Date)   BMI 27.44 kg/m    Findings:        No contractions noted - not in labor      NST was performed and has been reviewed by me.  NST INTERPRETATION: Category I  Mode: External Baseline Rate (A): 130 bpm Variability: Moderate Accelerations: 15 x 15 Decelerations: None     Contraction Frequency (min): none noted  ASSESSMENT Impression:  1.  Pregnancy:  at [redacted]w[redacted]d , EDD Estimated Date of Delivery: 04/19/21 2.  Reassuring fetal and maternal status 3.  Not in labor  PLAN 1. Discussed current condition and above findings with patient and reassurance given.  All questions answered. 2. Discharge home with standard labor precautions given to return to L&D or call the office for problems. 3. Continue  routine prenatal care.  I spent 25 minutes involved in the care of this patient preparing to see the patient by obtaining and reviewing her medical history (including labs, imaging tests and prior procedures), documenting clinical information in the electronic health record (EHR), counseling and coordinating care plans, writing and sending prescriptions, ordering tests or procedures and directly communicating with the patient by discussing pertinent items from her history and physical exam as well as detailing my assessment and plan as noted above so that she has an informed understanding.  All of her questions were answered.

## 2021-01-28 NOTE — OB Triage Note (Signed)
Patient is a 25 yo, G3P2, at 28 weeks 3 days. Patient presents with complaints of lower abdominal cramping and decreased fetal movement. Pt. Denies any vaginal bleeding or leakig of fluid. Monitors applied and assessing. VSS. Initial fetal heart tone 155. Will continue to monitor.

## 2021-01-31 ENCOUNTER — Other Ambulatory Visit: Payer: Self-pay

## 2021-01-31 ENCOUNTER — Encounter: Payer: Self-pay | Admitting: Obstetrics and Gynecology

## 2021-01-31 ENCOUNTER — Ambulatory Visit (INDEPENDENT_AMBULATORY_CARE_PROVIDER_SITE_OTHER): Payer: Medicaid Other | Admitting: Obstetrics and Gynecology

## 2021-01-31 VITALS — BP 111/71 | HR 101 | Wt 152.1 lb

## 2021-01-31 DIAGNOSIS — Z3482 Encounter for supervision of other normal pregnancy, second trimester: Secondary | ICD-10-CM

## 2021-01-31 DIAGNOSIS — Z3A28 28 weeks gestation of pregnancy: Secondary | ICD-10-CM

## 2021-01-31 LAB — POCT URINALYSIS DIPSTICK OB
Bilirubin, UA: NEGATIVE
Blood, UA: NEGATIVE
Glucose, UA: NEGATIVE
Ketones, UA: NEGATIVE
Leukocytes, UA: NEGATIVE
Nitrite, UA: NEGATIVE
POC,PROTEIN,UA: NEGATIVE
Spec Grav, UA: 1.01 (ref 1.010–1.025)
Urobilinogen, UA: 0.2 E.U./dL
pH, UA: 6.5 (ref 5.0–8.0)

## 2021-01-31 NOTE — Progress Notes (Signed)
ROB: Feeling much better than her visit when she was in labor and delivery.  Reports that she has hydrated and rested and her contractions have completely resolved.  Reports daily fetal movement including today.

## 2021-02-12 ENCOUNTER — Telehealth: Payer: Self-pay | Admitting: Obstetrics and Gynecology

## 2021-02-12 DIAGNOSIS — O219 Vomiting of pregnancy, unspecified: Secondary | ICD-10-CM

## 2021-02-12 MED ORDER — DOXYLAMINE-PYRIDOXINE 10-10 MG PO TBEC: 2.0000 | DELAYED_RELEASE_TABLET | Freq: Every day | ORAL | 0 refills | Status: DC

## 2021-02-12 NOTE — Telephone Encounter (Signed)
Spoke with patient and she has not been feeling well for a couple days. Patient has had nausea, vomiting, and some headache. Denies fever. I sent in nausea medication to the pharmacy. If patient continues to feel bad I have suggested to have a covid test done. I also let patient know that she needs to stay hydrated. I told patient that Gatorade, water, or Pedialyte would work. Patient will call back if she needs anything.

## 2021-02-12 NOTE — Telephone Encounter (Signed)
Patient called- she is 30 weeks, can not keep any food or water down. Pt states constantly throwing up , feels weak with chills denies fever started yesterday around 1:00pm. Pt has attempted to treat with tylenol. Please Advise.

## 2021-02-20 NOTE — Progress Notes (Signed)
   OB-Pt present for routine prenatal care. Pt stated fetal movement present; contractions absent; no vaginal bleeding and no changes in vaginal discharge. Pt c/o vaginal pressure and pain.

## 2021-02-20 NOTE — Patient Instructions (Signed)

## 2021-02-21 ENCOUNTER — Ambulatory Visit (INDEPENDENT_AMBULATORY_CARE_PROVIDER_SITE_OTHER): Payer: BC Managed Care – PPO | Admitting: Obstetrics and Gynecology

## 2021-02-21 ENCOUNTER — Other Ambulatory Visit: Payer: Self-pay

## 2021-02-21 ENCOUNTER — Encounter: Payer: Self-pay | Admitting: Obstetrics and Gynecology

## 2021-02-21 VITALS — BP 114/74 | HR 105 | Wt 151.4 lb

## 2021-02-21 DIAGNOSIS — Z3A31 31 weeks gestation of pregnancy: Secondary | ICD-10-CM

## 2021-02-21 DIAGNOSIS — O99013 Anemia complicating pregnancy, third trimester: Secondary | ICD-10-CM | POA: Diagnosis not present

## 2021-02-21 DIAGNOSIS — Z3483 Encounter for supervision of other normal pregnancy, third trimester: Secondary | ICD-10-CM

## 2021-02-21 LAB — POCT URINALYSIS DIPSTICK OB
Bilirubin, UA: NEGATIVE
Blood, UA: NEGATIVE
Glucose, UA: NEGATIVE
Nitrite, UA: NEGATIVE
Spec Grav, UA: 1.015 (ref 1.010–1.025)
Urobilinogen, UA: 1 E.U./dL
pH, UA: 7 (ref 5.0–8.0)

## 2021-02-21 NOTE — Progress Notes (Signed)
ROB: Doing well.  No major issues. Does note some vaginal pressure.  Is taking oral iron for anemia. RTC in 2 weeks.

## 2021-03-07 ENCOUNTER — Other Ambulatory Visit: Payer: Self-pay

## 2021-03-07 ENCOUNTER — Ambulatory Visit (INDEPENDENT_AMBULATORY_CARE_PROVIDER_SITE_OTHER): Payer: Medicaid Other | Admitting: Obstetrics and Gynecology

## 2021-03-07 VITALS — BP 115/76 | HR 106 | Wt 151.9 lb

## 2021-03-07 DIAGNOSIS — Z3483 Encounter for supervision of other normal pregnancy, third trimester: Secondary | ICD-10-CM

## 2021-03-07 DIAGNOSIS — Z3A33 33 weeks gestation of pregnancy: Secondary | ICD-10-CM

## 2021-03-07 LAB — POCT URINALYSIS DIPSTICK OB
Bilirubin, UA: NEGATIVE
Blood, UA: NEGATIVE
Glucose, UA: NEGATIVE
Ketones, UA: NEGATIVE
Leukocytes, UA: NEGATIVE
Nitrite, UA: NEGATIVE
POC,PROTEIN,UA: NEGATIVE
Spec Grav, UA: 1.01 (ref 1.010–1.025)
Urobilinogen, UA: 0.2 E.U./dL
pH, UA: 7 (ref 5.0–8.0)

## 2021-03-07 NOTE — Progress Notes (Signed)
ROB: Describes asymptomatic light vaginal discharge.  We have discussed this in some detail.  Cultures next visit.  Signs and symptoms of labor discussed.

## 2021-03-23 ENCOUNTER — Ambulatory Visit (INDEPENDENT_AMBULATORY_CARE_PROVIDER_SITE_OTHER): Payer: Medicaid Other | Admitting: Obstetrics and Gynecology

## 2021-03-23 ENCOUNTER — Other Ambulatory Visit: Payer: Self-pay

## 2021-03-23 VITALS — BP 108/75 | HR 92 | Wt 152.8 lb

## 2021-03-23 DIAGNOSIS — Z3A36 36 weeks gestation of pregnancy: Secondary | ICD-10-CM

## 2021-03-23 DIAGNOSIS — Z3685 Encounter for antenatal screening for Streptococcus B: Secondary | ICD-10-CM

## 2021-03-23 DIAGNOSIS — N898 Other specified noninflammatory disorders of vagina: Secondary | ICD-10-CM

## 2021-03-23 DIAGNOSIS — Z3403 Encounter for supervision of normal first pregnancy, third trimester: Secondary | ICD-10-CM

## 2021-03-23 LAB — POCT URINALYSIS DIPSTICK OB
Bilirubin, UA: NEGATIVE
Blood, UA: NEGATIVE
Glucose, UA: NEGATIVE
Ketones, UA: NEGATIVE
Leukocytes, UA: NEGATIVE
Nitrite, UA: NEGATIVE
POC,PROTEIN,UA: NEGATIVE
Spec Grav, UA: 1.01 (ref 1.010–1.025)
Urobilinogen, UA: 0.2 E.U./dL
pH, UA: 7.5 (ref 5.0–8.0)

## 2021-03-23 NOTE — Progress Notes (Signed)
ROB: She has concerns about leakage, that is not discharge.

## 2021-03-23 NOTE — Progress Notes (Signed)
ROB: Patient continues to complain of leaking a vaginal fluid (usually stains her pantyliner) that is different from her regular discharge (usually yellow, mucoid). Is now changing her pantyliner at least twice daily. Negative ph test, Nuswab collected, GBS done. RTC in 1 week.

## 2021-03-25 LAB — STREP GP B NAA: Strep Gp B NAA: POSITIVE — AB

## 2021-03-26 LAB — NUSWAB VAGINITIS PLUS (VG+)
Candida albicans, NAA: NEGATIVE
Candida glabrata, NAA: NEGATIVE
Chlamydia trachomatis, NAA: NEGATIVE
Neisseria gonorrhoeae, NAA: NEGATIVE
Trich vag by NAA: NEGATIVE

## 2021-03-30 ENCOUNTER — Telehealth: Payer: Self-pay | Admitting: Obstetrics and Gynecology

## 2021-03-30 ENCOUNTER — Ambulatory Visit (INDEPENDENT_AMBULATORY_CARE_PROVIDER_SITE_OTHER): Payer: BC Managed Care – PPO | Admitting: Obstetrics and Gynecology

## 2021-03-30 ENCOUNTER — Other Ambulatory Visit: Payer: Self-pay

## 2021-03-30 ENCOUNTER — Encounter: Payer: Self-pay | Admitting: Obstetrics and Gynecology

## 2021-03-30 VITALS — BP 117/81 | HR 118 | Wt 152.7 lb

## 2021-03-30 DIAGNOSIS — Z3A37 37 weeks gestation of pregnancy: Secondary | ICD-10-CM

## 2021-03-30 DIAGNOSIS — Z3483 Encounter for supervision of other normal pregnancy, third trimester: Secondary | ICD-10-CM

## 2021-03-30 DIAGNOSIS — M545 Low back pain, unspecified: Secondary | ICD-10-CM

## 2021-03-30 LAB — POCT URINALYSIS DIPSTICK OB
Bilirubin, UA: NEGATIVE
Blood, UA: NEGATIVE
Glucose, UA: NEGATIVE
Ketones, UA: NEGATIVE
Leukocytes, UA: NEGATIVE
Nitrite, UA: NEGATIVE
Spec Grav, UA: >=1.03 — AB (ref 1.010–1.025)
Urobilinogen, UA: 0.2 E.U./dL
pH, UA: 6 (ref 5.0–8.0)

## 2021-03-30 MED ORDER — ACETAMINOPHEN-CODEINE 300-30 MG PO TABS: 2.0000 | ORAL_TABLET | Freq: Four times a day (QID) | ORAL | 0 refills | Status: DC | PRN

## 2021-03-30 NOTE — Telephone Encounter (Signed)
Thurston Hole called asking for clarification on rx directions, asked for a return call at [phone]. Please Advise.
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
## 2021-03-30 NOTE — Patient Instructions (Signed)

## 2021-04-04 ENCOUNTER — Encounter: Payer: Self-pay | Admitting: Obstetrics and Gynecology

## 2021-04-04 ENCOUNTER — Ambulatory Visit (INDEPENDENT_AMBULATORY_CARE_PROVIDER_SITE_OTHER): Payer: Medicaid Other | Admitting: Obstetrics and Gynecology

## 2021-04-04 ENCOUNTER — Other Ambulatory Visit: Payer: Self-pay

## 2021-04-04 VITALS — BP 92/66 | HR 93 | Wt 153.9 lb

## 2021-04-04 DIAGNOSIS — Z3A37 37 weeks gestation of pregnancy: Secondary | ICD-10-CM

## 2021-04-04 DIAGNOSIS — Z3403 Encounter for supervision of normal first pregnancy, third trimester: Secondary | ICD-10-CM

## 2021-04-04 LAB — POCT URINALYSIS DIPSTICK OB
Bilirubin, UA: NEGATIVE
Blood, UA: NEGATIVE
Glucose, UA: NEGATIVE
Ketones, UA: NEGATIVE
Leukocytes, UA: NEGATIVE
Nitrite, UA: NEGATIVE
POC,PROTEIN,UA: NEGATIVE
Spec Grav, UA: 1.015 (ref 1.010–1.025)
Urobilinogen, UA: 0.2 E.U./dL
pH, UA: 6.5 (ref 5.0–8.0)

## 2021-04-04 NOTE — Progress Notes (Signed)
ROB: She is feeling good, just tired. No new concerns.

## 2021-04-04 NOTE — Progress Notes (Signed)
ROB: Her back still hurts but is feeling much better.  Denies contractions.  Signs and symptoms of labor discussed.

## 2021-04-06 ENCOUNTER — Other Ambulatory Visit: Payer: Self-pay

## 2021-04-06 ENCOUNTER — Encounter: Payer: Self-pay | Admitting: Obstetrics and Gynecology

## 2021-04-06 ENCOUNTER — Observation Stay
Admission: EM | Admit: 2021-04-06 | Discharge: 2021-04-06 | Disposition: A | Discharge status: Home / Self Care | Payer: BC Managed Care – PPO | Attending: Obstetrics and Gynecology | Admitting: Obstetrics and Gynecology

## 2021-04-06 DIAGNOSIS — H53149 Visual discomfort, unspecified: Secondary | ICD-10-CM | POA: Insufficient documentation

## 2021-04-06 DIAGNOSIS — Z3A38 38 weeks gestation of pregnancy: Secondary | ICD-10-CM

## 2021-04-06 DIAGNOSIS — R519 Headache, unspecified: Secondary | ICD-10-CM | POA: Diagnosis not present

## 2021-04-06 DIAGNOSIS — O26893 Other specified pregnancy related conditions, third trimester: Secondary | ICD-10-CM | POA: Diagnosis present

## 2021-04-06 DIAGNOSIS — O26899 Other specified pregnancy related conditions, unspecified trimester: Secondary | ICD-10-CM | POA: Diagnosis present

## 2021-04-06 MED ORDER — OXYCODONE-ACETAMINOPHEN 5-325 MG PO TABS
1.0000 | ORAL_TABLET | Freq: Once | ORAL | Status: AC
  Administered 2021-04-06: 1 via ORAL
  Filled 2021-04-06: qty 1

## 2021-04-06 NOTE — ED Triage Notes (Signed)
General headache with nausea and photosensitivity x3days, 38wks gravid Marcia Hodges is OB, +fetal movement, denies vaginal bleeding.

## 2021-04-06 NOTE — OB Triage Note (Signed)
Pt is a 25y/o G3P2 at [redacted]w[redacted]d with c/o headache for 3 days and gotten progressively worse rating 5/10 on pain scale. Pt states nausea and light sensitivity started when she woke up yesterday. Pt states she does see spots occasionally that come and go, pt stated that started yesterday about noon. Pt has +2 reflexes and no clonus. Pt states +FM. Pt denies LOF, CTX and VB. Monitors applied and assessing. Initial FHT 140.

## 2021-04-06 NOTE — Progress Notes (Signed)
L&D OB Triage Note  Marcia Hodges is a 25 y.o. [id number] female at [redacted]w[redacted]d, EDD Estimated Date of Delivery: 04/19/21 who presented to triage for complaints of headache x 3 days. She reported that the headache had gotten progressively worse, currently a 5/10.  She began experiencing nausea and light sensitivity yesterday after waking. No previous history of hypertensive disorder in pregnancy or migraines.  Took Tylenol earlier today with no relief in her headache so came to be assessed. She was evaluated by the nurses with no significant findings. Vital signs stable. An NST was performed and has been reviewed by MD. She was treated with Oxycodone 5 mg.    Physical Exam:  Blood pressure 114/73, pulse (!) 106, temperature 98 F (36.7 C), temperature source Oral, resp. rate 16, height 5\' 2"  (1.575 m), weight 69.8 kg, last menstrual period 07/13/2020, SpO2 100 %. Remainder of exam deferred  NST INTERPRETATION: Indications: patient reassurance  Mode: External Baseline Rate (A): 135 bpm Variability: Moderate Accelerations: 15 x 15 Decelerations: None     Contraction Frequency (min): one noted  Impression: reactive   Plan: NST performed was reviewed and was found to be reactive. She was discharged home as headache improved (decreased to 2/10).  Continue routine prenatal care. Follow up with OB/GYN as previously scheduled.     14/04/2020, MD [facility]

## 2021-04-08 ENCOUNTER — Other Ambulatory Visit: Payer: Self-pay

## 2021-04-08 ENCOUNTER — Observation Stay
Admission: EM | Admit: 2021-04-08 | Discharge: 2021-04-09 | Disposition: A | Discharge status: Home / Self Care | Payer: BC Managed Care – PPO | Attending: Obstetrics and Gynecology | Admitting: Obstetrics and Gynecology

## 2021-04-08 DIAGNOSIS — O479 False labor, unspecified: Secondary | ICD-10-CM | POA: Diagnosis present

## 2021-04-08 DIAGNOSIS — Z3A38 38 weeks gestation of pregnancy: Secondary | ICD-10-CM | POA: Insufficient documentation

## 2021-04-08 DIAGNOSIS — R198 Other specified symptoms and signs involving the digestive system and abdomen: Secondary | ICD-10-CM | POA: Diagnosis present

## 2021-04-08 NOTE — OB Triage Note (Signed)
Pt is a G3 P2 at 38w 3d with ctx's starting around 2030 this evening. Pt states that ctx's have increased in intensity and are around 4-5 minutes apart. Pt denies vaginal bleeding or LOF at this time.

## 2021-04-09 ENCOUNTER — Other Ambulatory Visit: Payer: Self-pay | Admitting: Obstetrics and Gynecology

## 2021-04-09 DIAGNOSIS — O479 False labor, unspecified: Secondary | ICD-10-CM | POA: Diagnosis present

## 2021-04-09 DIAGNOSIS — O4703 False labor before 37 completed weeks of gestation, third trimester: Secondary | ICD-10-CM

## 2021-04-09 DIAGNOSIS — Z3A38 38 weeks gestation of pregnancy: Secondary | ICD-10-CM

## 2021-04-09 DIAGNOSIS — R198 Other specified symptoms and signs involving the digestive system and abdomen: Secondary | ICD-10-CM | POA: Diagnosis present

## 2021-04-09 MED ORDER — HYDROXYZINE HCL 50 MG PO TABS
50.0000 mg | ORAL_TABLET | Freq: Once | ORAL | Status: AC
  Administered 2021-04-09: 50 mg via ORAL
  Filled 2021-04-09: qty 1

## 2021-04-09 NOTE — Final Progress Note (Signed)
L&D OB Triage Note  Marcia Hodges is a 25 y.o. [id number] female at [redacted]w[redacted]d, EDD Estimated Date of Delivery: 04/19/21 who presented to triage for complaints of contractions since 2030 last night, approximately 4-5 minutes apart, increasing in intensity.  She was evaluated by the nurses with no significant findings for active labor. Vital signs stable. An NST was performed and has been reviewed by MD.   Physical Exam:  Blood pressure 118/83, pulse 95, temperature 98.4 F (36.9 C), temperature source Oral, resp. rate 18, height 5\' 2"  (1.575 m), weight 68.9 kg, last menstrual period 07/13/2020. Dilation: 2.5 Effacement (%): 50 Cervical Position: Middle Station: -2 Presentation: Vertex Exam by:: [email or web address], RN   NST INTERPRETATION: Indications: rule out uterine contractions  Mode: External Baseline Rate (A): 140 bpm (FHT) Variability: Moderate Accelerations: 15 x 15 Decelerations: None     Contraction Frequency (min): 2-4  Impression: reactive   Plan: NST performed was reviewed and was found to be reactive.  No significant cervical change noted after 2 hours.  She was discharged home with bleeding/labor precautions.  Continue routine prenatal care. Follow up with OB/GYN as previously scheduled.     Toney Sang, MD [facility]

## 2021-04-12 DIAGNOSIS — Z0289 Encounter for other administrative examinations: Secondary | ICD-10-CM

## 2021-04-17 ENCOUNTER — Ambulatory Visit (INDEPENDENT_AMBULATORY_CARE_PROVIDER_SITE_OTHER): Payer: BC Managed Care – PPO | Admitting: Obstetrics and Gynecology

## 2021-04-17 ENCOUNTER — Other Ambulatory Visit: Payer: Self-pay | Admitting: Obstetrics and Gynecology

## 2021-04-17 ENCOUNTER — Other Ambulatory Visit: Payer: Self-pay

## 2021-04-17 ENCOUNTER — Encounter: Payer: Self-pay | Admitting: Obstetrics and Gynecology

## 2021-04-17 VITALS — BP 102/67 | HR 88 | Wt 155.3 lb

## 2021-04-17 DIAGNOSIS — Z3A39 39 weeks gestation of pregnancy: Secondary | ICD-10-CM

## 2021-04-17 DIAGNOSIS — Z23 Encounter for immunization: Secondary | ICD-10-CM | POA: Diagnosis not present

## 2021-04-17 DIAGNOSIS — Z3403 Encounter for supervision of normal first pregnancy, third trimester: Secondary | ICD-10-CM

## 2021-04-17 LAB — POCT URINALYSIS DIPSTICK OB
Bilirubin, UA: NEGATIVE
Blood, UA: NEGATIVE
Glucose, UA: NEGATIVE
Ketones, UA: NEGATIVE
Nitrite, UA: NEGATIVE
POC,PROTEIN,UA: NEGATIVE
Spec Grav, UA: 1.01 (ref 1.010–1.025)
Urobilinogen, UA: 0.2 E.U./dL
pH, UA: 7 (ref 5.0–8.0)

## 2021-04-17 NOTE — Progress Notes (Signed)
ROB: She is doing well, just tired. She has no new concerns today.

## 2021-04-17 NOTE — Patient Instructions (Addendum)
COVID 19 Instructions for Scheduled Procedure (Inductions/C-sections and GYN surgeries)   Thank you for choosing [facility] for your services.  You have been scheduled for a procedure called ____Induction of Labor__________________.    Your procedure is scheduled on _________September 21, 2022 at midnight_________________________.  You are required to have COVID-19 testing performed 2 days prior to your scheduled procedure date.  Testing is performed between 9 AM and 1 PM Monday through Friday.  Please present for testing on ____September 19, 2022________________ during this hour. Drive up testing is performed in the [facility] (this is next to the [employer] [facility]).    Upon your scheduled procedure date, you will need to arrive at the [facility] entrance. (There is a statue at the front of this entrance.)   Please arrive on time if you are scheduled for an induction of labor.   If you are scheduled for a Cesarean delivery or for Gyn Surgery, arrive 2 hours prior to your procedure time.   If you are an Obstetric patient and your arrival time falls between 11 PM and 6 AM call L&D [phone]) when you arrive.  A staff member will meet you at the [facility] entrance.  At this time, patients are allowed 1 support person to accompany them. Face masks are required for you and your support person. Your support person is now allowed to be there with you during the entire time of your admission.   Please contact the office if you have any questions regarding this information.  The [facility] office number is [phone].     Thank you,    Your [facility] Providers     Signs and Symptoms of Labor Labor is the body's natural process of moving the baby and the placenta out of the uterus. The process of labor usually starts when the baby is full-term, between 34 and 40 weeks of pregnancy. Signs and symptoms that you are close to going into labor As your body  prepares for labor and the birth of your baby, you may notice the following symptoms in the weeks and days before true labor starts: Passing a small amount of thick, bloody mucus from your vagina. This is called normal bloody show or losing your mucus plug. This may happen more than a week before labor begins, or right before labor begins, as the opening of the cervix starts to widen (dilate). For some women, the entire mucus plug passes at once. For others, pieces of the mucus plug may gradually pass over several days. Your baby moving (dropping) lower in your pelvis to get into position for birth (lightening). When this happens, you may feel more pressure on your bladder and pelvic bone and less pressure on your ribs. This may make it easier to breathe. It may also cause you to need to urinate more often and have problems with bowel movements. Having "practice contractions," also called Braxton Hicks contractions or false labor. These occur at irregular (unevenly spaced) intervals that are more than 10 minutes apart. False labor contractions are common after exercise or sexual activity. They will stop if you change position, rest, or drink fluids. These contractions are usually mild and do not get stronger over time. They may feel like: A backache or back pain. Mild cramps, similar to menstrual cramps. Tightening or pressure in your abdomen. Other early symptoms include: Nausea or loss of appetite. Diarrhea. Having a sudden burst of energy, or feeling very tired. Mood changes. Having  trouble sleeping. Signs and symptoms that labor has begun Signs that you are in labor may include: Having contractions that come at regular (evenly spaced) intervals and increase in intensity. This may feel like more intense tightening or pressure in your abdomen that moves to your back. Contractions may also feel like rhythmic pain in your upper thighs or back that comes and goes at regular intervals. For first-time  mothers, this change in intensity of contractions often occurs at a more gradual pace. Women who have given birth before may notice a more rapid progression of contraction changes. Feeling pressure in the vaginal area. Your water breaking (rupture of membranes). This is when the sac of fluid that surrounds your baby breaks. Fluid leaking from your vagina may be clear or blood-tinged. Labor usually starts within 24 hours of your water breaking, but it may take longer to begin. Some women may feel a sudden gush of fluid. Others notice that their underwear repeatedly becomes damp. Follow these instructions at home:  When labor starts, or if your water breaks, call your health care provider or nurse care line. Based on your situation, they will determine when you should go in for an exam. During early labor, you may be able to rest and manage symptoms at home. Some strategies to try at home include: Breathing and relaxation techniques. Taking a warm bath or shower. Listening to music. Using a heating pad on the lower back for pain. If you are directed to use heat: Place a towel between your skin and the heat source. Leave the heat on for 20-30 minutes. Remove the heat if your skin turns bright red. This is especially important if you are unable to feel pain, heat, or cold. You may have a greater risk of getting burned. Contact a health care provider if: Your labor has started. Your water breaks. Get help right away if: You have painful, regular contractions that are 5 minutes apart or less. Labor starts before you are [redacted] weeks along in your pregnancy. You have a fever. You have bright red blood coming from your vagina. You do not feel your baby moving. You have a severe headache with or without vision problems. You have severe nausea, vomiting, or diarrhea. You have chest pain or shortness of breath. These symptoms may represent a serious problem that is an emergency. Do not wait to see if  the symptoms will go away. Get medical help right away. Call your local emergency services (911 in the [place] Do not drive yourself to the hospital. Summary Labor is your body's natural process of moving your baby and the placenta out of your uterus. The process of labor usually starts when your baby is full-term, between 23 and 40 weeks of pregnancy. When labor starts, or if your water breaks, call your health care provider or nurse care line. Based on your situation, they will determine when you should go in for an exam. This information is not intended to replace advice given to you by your health care provider. Make sure you discuss any questions you have with your health care provider. Document Revised: 05/13/2020 Document Reviewed: 05/13/2020 Elsevier Patient Education  2022 [employer].

## 2021-04-17 NOTE — Progress Notes (Signed)
ROB: Doing well, no complaints. Flu vaccine given today. Discussed IOL for postdates next week, scheduled for 04/25/21.

## 2021-04-20 ENCOUNTER — Inpatient Hospital Stay
Admission: EM | Admit: 2021-04-20 | Discharge: 2021-04-21 | DRG: 807 | Disposition: A | Discharge status: Home / Self Care | Payer: BC Managed Care – PPO | Attending: Obstetrics and Gynecology | Admitting: Obstetrics and Gynecology

## 2021-04-20 ENCOUNTER — Other Ambulatory Visit: Payer: Self-pay

## 2021-04-20 ENCOUNTER — Encounter: Payer: Self-pay | Admitting: Obstetrics and Gynecology

## 2021-04-20 DIAGNOSIS — F32A Depression, unspecified: Secondary | ICD-10-CM

## 2021-04-20 DIAGNOSIS — Z3A40 40 weeks gestation of pregnancy: Secondary | ICD-10-CM

## 2021-04-20 DIAGNOSIS — D509 Iron deficiency anemia, unspecified: Secondary | ICD-10-CM | POA: Diagnosis present

## 2021-04-20 DIAGNOSIS — Z20822 Contact with and (suspected) exposure to covid-19: Secondary | ICD-10-CM | POA: Diagnosis present

## 2021-04-20 DIAGNOSIS — O99013 Anemia complicating pregnancy, third trimester: Secondary | ICD-10-CM

## 2021-04-20 DIAGNOSIS — O99824 Streptococcus B carrier state complicating childbirth: Secondary | ICD-10-CM | POA: Diagnosis present

## 2021-04-20 DIAGNOSIS — O9902 Anemia complicating childbirth: Secondary | ICD-10-CM | POA: Diagnosis present

## 2021-04-20 DIAGNOSIS — F419 Anxiety disorder, unspecified: Secondary | ICD-10-CM

## 2021-04-20 DIAGNOSIS — O26893 Other specified pregnancy related conditions, third trimester: Secondary | ICD-10-CM | POA: Diagnosis present

## 2021-04-20 LAB — CBC
HCT: 34.6 % — ABNORMAL LOW (ref 36.0–46.0)
Hemoglobin: 10.9 g/dL — ABNORMAL LOW (ref 12.0–15.0)
MCH: 22.7 pg — ABNORMAL LOW (ref 26.0–34.0)
MCHC: 31.5 g/dL (ref 30.0–36.0)
MCV: 72.1 fL — ABNORMAL LOW (ref 80.0–100.0)
Platelets: 294 10*3/uL (ref 150–400)
RBC: 4.8 MIL/uL (ref 3.87–5.11)
RDW: 18.7 % — ABNORMAL HIGH (ref 11.5–15.5)
WBC: 12.2 10*3/uL — ABNORMAL HIGH (ref 4.0–10.5)
nRBC: 0.2 % (ref 0.0–0.2)

## 2021-04-20 LAB — TYPE AND SCREEN
ABO/RH(D): A POS
Antibody Screen: NEGATIVE

## 2021-04-20 LAB — RESP PANEL BY RT-PCR (FLU A&B, COVID) ARPGX2
Influenza A by PCR: NEGATIVE
Influenza B by PCR: NEGATIVE
SARS Coronavirus 2 by RT PCR: NEGATIVE

## 2021-04-20 MED ORDER — ACETAMINOPHEN 325 MG PO TABS: 650.0000 mg | ORAL_TABLET | ORAL | Status: DC | PRN

## 2021-04-20 MED ORDER — ONDANSETRON HCL 4 MG/2ML IJ SOLN: 4.0000 mg | Freq: Four times a day (QID) | INTRAMUSCULAR | Status: DC | PRN

## 2021-04-20 MED ORDER — ONDANSETRON HCL 4 MG/2ML IJ SOLN: 4.0000 mg | INTRAMUSCULAR | Status: DC | PRN

## 2021-04-20 MED ORDER — OXYTOCIN BOLUS FROM INFUSION
333.0000 mL | Freq: Once | INTRAVENOUS | Status: AC
  Administered 2021-04-20: 333 mL via INTRAVENOUS

## 2021-04-20 MED ORDER — OXYTOCIN-SODIUM CHLORIDE 30-0.9 UT/500ML-% IV SOLN
2.5000 [IU]/h | INTRAVENOUS | Status: DC
  Administered 2021-04-20: 2.5 [IU]/h via INTRAVENOUS
  Filled 2021-04-20: qty 1000

## 2021-04-20 MED ORDER — MISOPROSTOL 200 MCG PO TABS
ORAL_TABLET | ORAL | Status: AC
  Filled 2021-04-20: qty 4

## 2021-04-20 MED ORDER — SOD CITRATE-CITRIC ACID 500-334 MG/5ML PO SOLN: 30.0000 mL | ORAL | Status: DC | PRN

## 2021-04-20 MED ORDER — SIMETHICONE 80 MG PO CHEW: 80.0000 mg | CHEWABLE_TABLET | ORAL | Status: DC | PRN

## 2021-04-20 MED ORDER — ACETAMINOPHEN 325 MG PO TABS
650.0000 mg | ORAL_TABLET | ORAL | Status: DC | PRN
  Administered 2021-04-20 – 2021-04-21 (×3): 650 mg via ORAL
  Filled 2021-04-20 (×3): qty 2

## 2021-04-20 MED ORDER — LACTATED RINGERS IV SOLN: INTRAVENOUS | Status: DC

## 2021-04-20 MED ORDER — WITCH HAZEL-GLYCERIN EX PADS
1.0000 "application " | MEDICATED_PAD | CUTANEOUS | Status: DC | PRN
  Filled 2021-04-20: qty 100

## 2021-04-20 MED ORDER — SENNOSIDES-DOCUSATE SODIUM 8.6-50 MG PO TABS
2.0000 | ORAL_TABLET | Freq: Every day | ORAL | Status: DC
  Administered 2021-04-21: 2 via ORAL
  Filled 2021-04-20: qty 2

## 2021-04-20 MED ORDER — AMPICILLIN SODIUM 2 G IJ SOLR
2.0000 g | Freq: Once | INTRAMUSCULAR | Status: DC
Start: 2021-04-20 — End: 2021-04-20

## 2021-04-20 MED ORDER — PRENATAL MULTIVITAMIN CH
1.0000 | ORAL_TABLET | Freq: Every day | ORAL | Status: DC
  Administered 2021-04-20 – 2021-04-21 (×2): 1 via ORAL
  Filled 2021-04-20 (×2): qty 1

## 2021-04-20 MED ORDER — BUTORPHANOL TARTRATE 1 MG/ML IJ SOLN
1.0000 mg | INTRAMUSCULAR | Status: DC | PRN
Start: 2021-04-20 — End: 2021-04-20

## 2021-04-20 MED ORDER — OXYCODONE-ACETAMINOPHEN 5-325 MG PO TABS: 2.0000 | ORAL_TABLET | ORAL | Status: DC | PRN

## 2021-04-20 MED ORDER — SODIUM CHLORIDE 0.9 % IV SOLN
1.0000 g | INTRAVENOUS | Status: DC
  Filled 2021-04-20 (×4): qty 1000

## 2021-04-20 MED ORDER — OXYTOCIN 10 UNIT/ML IJ SOLN
INTRAMUSCULAR | Status: AC
  Filled 2021-04-20: qty 2

## 2021-04-20 MED ORDER — AMMONIA AROMATIC IN INHA
RESPIRATORY_TRACT | Status: AC
  Filled 2021-04-20: qty 10

## 2021-04-20 MED ORDER — LACTATED RINGERS IV SOLN: 125.0000 mL/h | INTRAVENOUS | Status: DC

## 2021-04-20 MED ORDER — LIDOCAINE HCL (PF) 1 % IJ SOLN
INTRAMUSCULAR | Status: AC
  Filled 2021-04-20: qty 30

## 2021-04-20 MED ORDER — FAMOTIDINE 20 MG PO TABS: 40.0000 mg | ORAL_TABLET | Freq: Once | ORAL | Status: DC

## 2021-04-20 MED ORDER — LACTATED RINGERS IV SOLN: 500.0000 mL | INTRAVENOUS | Status: DC | PRN

## 2021-04-20 MED ORDER — DIPHENHYDRAMINE HCL 25 MG PO CAPS: 25.0000 mg | ORAL_CAPSULE | Freq: Four times a day (QID) | ORAL | Status: DC | PRN

## 2021-04-20 MED ORDER — OXYCODONE-ACETAMINOPHEN 5-325 MG PO TABS
1.0000 | ORAL_TABLET | ORAL | Status: DC | PRN
Start: 2021-04-20 — End: 2021-04-20

## 2021-04-20 MED ORDER — DIBUCAINE (PERIANAL) 1 % EX OINT: 1.0000 "application " | TOPICAL_OINTMENT | CUTANEOUS | Status: DC | PRN

## 2021-04-20 MED ORDER — COCONUT OIL OIL: 1.0000 "application " | TOPICAL_OIL | Status: DC | PRN

## 2021-04-20 MED ORDER — METOCLOPRAMIDE HCL 10 MG PO TABS
10.0000 mg | ORAL_TABLET | Freq: Once | ORAL | Status: DC
  Filled 2021-04-20: qty 1

## 2021-04-20 MED ORDER — LACTATED RINGERS IV BOLUS
1000.0000 mL | Freq: Once | INTRAVENOUS | Status: DC
Start: 2021-04-20 — End: 2021-04-20

## 2021-04-20 MED ORDER — ONDANSETRON HCL 4 MG PO TABS: 4.0000 mg | ORAL_TABLET | ORAL | Status: DC | PRN

## 2021-04-20 MED ORDER — ZOLPIDEM TARTRATE 5 MG PO TABS: 5.0000 mg | ORAL_TABLET | Freq: Every evening | ORAL | Status: DC | PRN

## 2021-04-20 MED ORDER — IBUPROFEN 600 MG PO TABS
600.0000 mg | ORAL_TABLET | Freq: Four times a day (QID) | ORAL | Status: DC
  Administered 2021-04-20 – 2021-04-21 (×5): 600 mg via ORAL
  Filled 2021-04-20 (×5): qty 1

## 2021-04-20 MED ORDER — LIDOCAINE HCL (PF) 1 % IJ SOLN
30.0000 mL | INTRAMUSCULAR | Status: DC | PRN
  Filled 2021-04-20: qty 30

## 2021-04-20 MED ORDER — BENZOCAINE-MENTHOL 20-0.5 % EX AERO
1.0000 "application " | INHALATION_SPRAY | CUTANEOUS | Status: DC | PRN
  Filled 2021-04-20: qty 56

## 2021-04-20 NOTE — Discharge Instructions (Signed)

## 2021-04-20 NOTE — H&P (Signed)
Obstetric History and Physical  Marcia Hodges Marcia Hodges is a 25 y.o. [id number] with IUP at [redacted]w[redacted]d presenting for complaints of contractions since ~ 0300 this morning. Patient states she has been having  irregular, every 5-7 minutes contractions,  had small amount of vaginal bleeding yesterday, intact membranes, with active fetal movement.    Prenatal Course Source of Hodges: Marcia Hodges with onset of Hodges at 8 weeks Pregnancy complications or risks: Patient Active Problem List   Diagnosis Date Noted   Indication for Hodges in labor or delivery 04/20/2021   Abdominal complaints 04/09/2021   Irregular uterine contractions 04/09/2021   Labor and delivery indication for Hodges or intervention 04/06/2021   Headache in pregnancy 04/06/2021   Pregnancy 01/28/2021   Anemia of pregnancy in third trimester 04/27/2016   She plans to breastfeed She desires bilateral tubal ligation for postpartum contraception.   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   Allergic rhinitis    Hematuria    Medical history non-contributory     Past Surgical History:  Procedure Laterality Date   wisdom teeth pulled      OB History  Gravida Para Term Preterm AB Living  3 2 2  0 0 2  SAB IAB Ectopic Multiple Live Births  0 0 0 0 2    # Outcome Date GA Lbr Len/2nd Weight Sex Delivery Anes PTL Lv  3 Current           2 Term 11/26/18 [redacted]w[redacted]d / 00:09 3340 g M Vag-Spont EPI  LIV  1 Term 04/25/16 [redacted]w[redacted]d  3210 g M Vag-Spont EPI  LIV    Social History   Socioeconomic History   Marital status: Single     Spouse name: Marcia Hodges   Number of children: Not on file   Years of education: Not on file   Highest education level: Not on file  Occupational History   Not on file  Tobacco Use   Smoking status: Never   Smokeless tobacco: Never  Vaping Use   Vaping Use: Former  Substance and Sexual Activity   Alcohol use: No    Alcohol/week: 0.0 standard drinks   Drug use: No   Sexual activity: Yes    Partners: Male    Birth control/protection: None  Other Topics Concern   Not on file  Social History Narrative   Not on file   Social Determinants of Health   Financial Resource Strain: Not on file  Food Insecurity: Not on file  Transportation Needs: Not on file  Physical Activity: Not on file  Stress: Not on file  Social Connections: Not on file    Family History  Problem Relation Age of Onset   Hypertension Father    Diabetes Neg Hx    Cancer Neg Hx    Hearing loss Neg Hx     Medications Prior to Admission  Medication Sig Dispense Refill Last Dose   Iron-FA-B Cmp-C-Biot-Probiotic (FUSION PLUS) CAPS Take 65 mg by mouth 2 (two) times daily with a meal. 30 capsule 6 04/19/2021   Prenatal Vit-Fe Fumarate-FA (MULTIVITAMIN-PRENATAL) 27-0.8 MG TABS tablet Take 1 tablet  by mouth daily at 12 noon.   04/19/2021    Allergies  Allergen Reactions   Femoxetine    Ferumoxytol Rash    Review of Systems: Negative except for what is mentioned in HPI.  Physical Exam: BP 117/71   Pulse 91   Temp 97.9 F (36.6 C) (Oral)   Resp 18   Ht 5\' 2"  (1.575 m)   Wt 70.4 kg   LMP 07/13/2020 (Exact Date)   BMI 28.41 kg/m  CONSTITUTIONAL: Well-developed, well-nourished female in no acute distress.  HENT:  Normocephalic, atraumatic, External right and left ear normal. Oropharynx is clear and moist EYES: Conjunctivae and EOM are normal. Pupils are equal, round, and reactive to light. No scleral icterus.  NECK: Normal range of motion, supple, no masses SKIN: Skin is warm and dry. No rash noted. Not  diaphoretic. No erythema. No pallor. NEUROLOGIC: Alert and oriented to person, place, and time. Normal reflexes, muscle tone coordination. No cranial nerve deficit noted. PSYCHIATRIC: Normal mood and affect. Normal behavior. Normal judgment and thought content. CARDIOVASCULAR: Normal heart rate noted, regular rhythm RESPIRATORY: Effort and breath sounds normal, no problems with respiration noted ABDOMEN: Soft, nontender, nondistended, gravid. MUSCULOSKELETAL: Normal range of motion. No edema and no tenderness. 2+ distal pulses.  Cervical Exam: Dilatation 4.5 cm   Effacement 70%   Station 0  (changed from 3/50/-1 an hour ago) Presentation: cephalic FHT:  Baseline rate 125 bpm   Variability moderate  Accelerations present   Decelerations none Contractions: Every 2-5 mins   Pertinent Labs/Studies:   Results for orders placed or performed during the Hodges encounter of 04/20/21 (from the past 24 hour(s))  CBC     Status: Abnormal   Collection Time: 04/20/21  8:44 AM  Result Value Ref Range   WBC 12.2 (H) 4.0 - 10.5 K/uL   RBC 4.80 3.87 - 5.11 MIL/uL   Hemoglobin 10.9 (L) 12.0 - 15.0 g/dL   HCT 04/22/21 (L) 46.9 - 62.9 %   MCV 72.1 (L) 80.0 - 100.0 fL   MCH 22.7 (L) 26.0 - 34.0 pg   MCHC 31.5 30.0 - 36.0 g/dL   RDW 52.8 (H) 41.3 - 24.4 %   Platelets 294 150 - 400 K/uL   nRBC 0.2 0.0 - 0.2 %    Assessment : Marcia Hodges is a 25 y.o. G3P2002 at [redacted]w[redacted]d being admitted for labor. GBS positive. H/o anxiety/depression, no meds.   Plan: Labor: Expectant management. Augmentation as needed with Pitocin as per protocol. Analgesia as needed, desires epidural. FWB: Reassuring fetal heart tracing.  GBS positive, will treat with Ampicillin. Delivery plan: Hopeful for vaginal delivery    [redacted]w[redacted]d, MD Marcia Hodges

## 2021-04-20 NOTE — OB Triage Note (Signed)
Patient arrived in triage with complaints of contractions since approx 0300 this morning. States they are 5-7 mins apart, lasting 1.5 mins. Reports good fetal movement. Denies leaking of fluid. Reports some vaginal bleeding, some mucous discharge noted yesterday. Monitors applied and assessing. Initial fetal heart tones 130's. Abdomen non tender to touch.

## 2021-04-21 ENCOUNTER — Encounter
Admission: EM | Disposition: A | Discharge status: Home / Self Care | Payer: Self-pay | Source: Home / Self Care | Attending: Obstetrics and Gynecology

## 2021-04-21 LAB — CBC
HCT: 35.4 % — ABNORMAL LOW (ref 36.0–46.0)
Hemoglobin: 10.7 g/dL — ABNORMAL LOW (ref 12.0–15.0)
MCH: 21.7 pg — ABNORMAL LOW (ref 26.0–34.0)
MCHC: 30.2 g/dL (ref 30.0–36.0)
MCV: 72 fL — ABNORMAL LOW (ref 80.0–100.0)
Platelets: 260 10*3/uL (ref 150–400)
RBC: 4.92 MIL/uL (ref 3.87–5.11)
RDW: 19.4 % — ABNORMAL HIGH (ref 11.5–15.5)
WBC: 12.7 10*3/uL — ABNORMAL HIGH (ref 4.0–10.5)
nRBC: 0 % (ref 0.0–0.2)

## 2021-04-21 LAB — RPR: RPR Ser Ql: NONREACTIVE

## 2021-04-21 SURGERY — LIGATION, FALLOPIAN TUBE, POSTPARTUM
Anesthesia: Choice | Laterality: Bilateral

## 2021-04-21 MED ORDER — IBUPROFEN 600 MG PO TABS: 600.0000 mg | ORAL_TABLET | Freq: Four times a day (QID) | ORAL | 0 refills | Status: DC

## 2021-04-21 NOTE — Progress Notes (Signed)
Notified Dr. Valentino Saxon via phone call at 0600 that patient has declined the BTL procedure at this time.

## 2021-04-21 NOTE — Progress Notes (Signed)
Patient notified nurse at 0400 check that she has now decided not to go through with the BTL procedure.   Nurse notified OR charge nurse at [phone]. Nurse will call Dr. Valentino Saxon and notify her at 0600.   Informed patient she can eat/drink. No longer NPO.

## 2021-04-21 NOTE — Discharge Summary (Signed)
Postpartum Discharge Summary      Patient Name: Marcia Hodges DOB: 08-29-1995 MRN: [id number]  Date of admission: 04/20/2021 Delivery date:04/20/2021  Delivering provider: Boykin Nearing  Date of discharge: 04/21/2021  Admitting diagnosis: Indication for care in labor or delivery [O75.9] Intrauterine pregnancy: [redacted]w[redacted]d     Secondary diagnosis:  Active Problems:   Indication for care in labor or delivery  Additional problems: Anemia of pregnancy, history of anxiety and depression (no meds currently)    Discharge diagnosis: Term Pregnancy Delivered and Anemia                                              Post partum procedures: None Augmentation:  None Complications: None  Hospital course: Onset of Labor With Vaginal Delivery      25 y.o. yo G3P3003 at [redacted]w[redacted]d was admitted in Active Labor on 04/20/2021. Patient had an uncomplicated labor course as follows:  Membrane Rupture Time/Date: 9:06 AM ,04/20/2021   Delivery Method:Vaginal, Spontaneous  Episiotomy: None  Lacerations:  None  Patient had an uncomplicated postpartum course.  She is ambulating, tolerating a regular diet, passing flatus, and urinating well. Patient is discharged home in stable condition on 04/21/21.  Newborn Data: Birth date:04/20/2021  Birth time:9:07 AM  Gender:Female  Living status:Living  Apgars:9 ,9  Weight:2770 g   Magnesium Sulfate received: No BMZ received: No Rhophylac:No MMR:No T-DaP:Given prenatally Flu: Given prenatally Transfusion:No  Physical exam  Vitals:   04/20/21 2024 04/21/21 0005 04/21/21 0402 04/21/21 0805  BP: 102/65 107/70 103/67 113/63  Pulse: 66 (!) 59 (!) 58 67  Resp: $Remo'20 18 20 20  'AKTTd$ Temp: 98.1 F (36.7 C) 97.8 F (36.6 C) 97.6 F (36.4 C) 97.7 F (36.5 C)  TempSrc: Oral Oral Oral Oral  SpO2: 99% 99% 100% 98%  Weight:      Height:       General: alert, cooperative, and no distress Lochia: appropriate Uterine Fundus: firm Incision: N/A DVT Evaluation: No  evidence of DVT seen on physical exam. Negative Homan's sign. No cords or calf tenderness. No significant calf/ankle edema.   Labs: Lab Results  Component Value Date   WBC 12.7 (H) 04/21/2021   HGB 10.7 (L) 04/21/2021   HCT 35.4 (L) 04/21/2021   MCV 72.0 (L) 04/21/2021   PLT 260 04/21/2021   CMP Latest Ref Rng & Units 07/29/2019  Glucose 70 - 99 mg/dL 106(H)  BUN 6 - 20 mg/dL 12  Creatinine 0.44 - 1.00 mg/dL 0.72  Sodium 135 - 145 mmol/L 137  Potassium 3.5 - 5.1 mmol/L 3.8  Chloride 98 - 111 mmol/L 107  CO2 22 - 32 mmol/L 23  Calcium 8.9 - 10.3 mg/dL 8.8(L)  Total Protein 6.5 - 8.1 g/dL -  Total Bilirubin 0.3 - 1.2 mg/dL -  Alkaline Phos 38 - 126 U/L -  AST 15 - 41 U/L -  ALT 0 - 44 U/L -   Edinburgh Score: Edinburgh Postnatal Depression Scale Screening Tool 04/20/2021  I have been able to laugh and see the funny side of things. 0  I have looked forward with enjoyment to things. 0  I have blamed myself unnecessarily when things went wrong. 0  I have been anxious or worried for no good reason. 1  I have felt scared or panicky for no good reason. 0  Things have been getting  on top of me. 0  I have been so unhappy that I have had difficulty sleeping. 0  I have felt sad or miserable. 0  I have been so unhappy that I have been crying. 0  The thought of harming myself has occurred to me. 0  Edinburgh Postnatal Depression Scale Total 1      After visit meds:  Allergies as of 04/21/2021       Reactions   Femoxetine    Ferumoxytol Rash        Medication List     TAKE these medications    Fusion Plus Caps Take 65 mg by mouth 2 (two) times daily with a meal.   ibuprofen 600 MG tablet Commonly known as: ADVIL Take 1 tablet (600 mg total) by mouth every 6 (six) hours.   multivitamin-prenatal 27-0.8 MG Tabs tablet Take 1 tablet by mouth daily at 12 noon.         Discharge home in stable condition Infant Feeding: Breast Infant Disposition:home with  mother Discharge instruction: per After Visit Summary and Postpartum booklet. Activity: Advance as tolerated. Pelvic rest for 6 weeks.  Diet: routine diet Anticipated Birth Control: Nexplanon and Vasectomy Postpartum Appointment:6 weeks Additional Postpartum F/U: Postpartum Depression checkup in 2 weeks (televisit) Future Appointments:No future appointments. Follow up Visit:  Follow-up Information     Rubie Maid, MD Follow up.   Specialties: Obstetrics and Gynecology, Radiology Why: 2 week postartum mood check video visit 6 week postpartum visit Contact information: [address]   [id number] [phone]                     04/21/2021 Rubie Maid, MD

## 2021-04-21 NOTE — Progress Notes (Signed)
Prior to midnight, patient given motrin and tylenol for pain control and explained to be NPO after midnight.   CHG wipes given to patient and explained to complete upon next trip to the restroom.   Patient in tears in room, unsure about going through with BTL. Explained that it is entirely up to the patient and we could have Dr. Valentino Saxon  come talk to the patient in the morning.

## 2021-04-21 NOTE — Progress Notes (Signed)
Patient d/c home with infant. D/c instructions, Rx, and f/u appt given to and reviewed with pt. Pt verbalized understanding. Escorted out by auxillary.   

## 2021-04-21 NOTE — Progress Notes (Signed)
Post Partum Day # 1, s/p SVD  Subjective: no complaints, up ad lib, voiding, and tolerating PO  Objective: Temp:  [97.6 F (36.4 C)-98.1 F (36.7 C)] 97.7 F (36.5 C) (09/17 0805) Pulse Rate:  [58-78] 67 (09/17 0805) Resp:  [18-20] 20 (09/17 0805) BP: (102-118)/(63-72) 113/63 (09/17 0805) SpO2:  [98 %-100 %] 98 % (09/17 0805)  Physical Exam:  General: alert and no distress  Lungs: clear to auscultation bilaterally Breasts: normal appearance, no masses or tenderness Heart: regular rate and rhythm, S1, S2 normal, no murmur, click, rub or gallop Abdomen: soft, non-tender; bowel sounds normal; no masses,  no organomegaly Pelvis: Lochia: appropriate, Uterine Fundus: firm Extremities: DVT Evaluation: No evidence of DVT seen on physical exam. Negative Homan's sign. No cords or calf tenderness. No significant calf/ankle edema.   Recent Labs    04/20/21 0844 04/21/21 0539  HGB 10.9* 10.7*  HCT 34.6* 35.4*    Assessment/Plan: Doing well postpartum Breastfeeding, Lactation consult as needed Circumcision prior to discharge Contraception: previously desired BTL, however now plans for vasectomy.  Will use Nexplanon in interim.    LOS: 1 day   Hildred Laser, MD [facility] 04/21/2021 12:31 PM

## 2021-05-08 ENCOUNTER — Telehealth (INDEPENDENT_AMBULATORY_CARE_PROVIDER_SITE_OTHER): Payer: BC Managed Care – PPO | Admitting: Obstetrics and Gynecology

## 2021-05-08 ENCOUNTER — Encounter: Payer: Self-pay | Admitting: Obstetrics and Gynecology

## 2021-05-08 ENCOUNTER — Other Ambulatory Visit: Payer: Self-pay

## 2021-05-08 VITALS — Ht 62.0 in

## 2021-05-08 DIAGNOSIS — Z1332 Encounter for screening for maternal depression: Secondary | ICD-10-CM

## 2021-05-08 NOTE — Progress Notes (Signed)
    Virtual Visit via Video Note  I connected with [facility] on 05/08/21 at  4:15 PM EDT by a video enabled telemedicine application and verified that I am speaking with the correct person using two identifiers.  Location: Patient: Home Provider: Office   I discussed the limitations of evaluation and management by telemedicine and the availability of in person appointments. The patient expressed understanding and agreed to proceed.  History of Present Illness:  Marcia Hodges is a 25 y.o. G6P3003 female who presents for a 2 week postpartum visit and mood check. She is 2 week postpartum following a spontaneous vaginal delivery. I have fully reviewed the prenatal and intrapartum course. The delivery was at 40 gestational weeks.  Anesthesia: none. Postpartum course has been uncomplicated. Baby's course has been uncomplicated. Baby is feeding by bottle - Gerber Goodstart Gentle. Bleeding alternates between moderate and light flow, denies passage of large clots. Contraception method desired is Nexplanon and partner vasectomy. Postpartum depression screening: negative.  EDPS score is 1.    Review of Systems Pertinent items noted in HPI and remainder of comprehensive ROS otherwise negative  Observations/Objective: Height 5\' 2"  (1.575 m), not currently breastfeeding.  Weight unknown.  Gen App: NAD Psych: mood and affect normal.   Edinburgh Postnatal Depression Scale Screening Tool 05/08/2021 04/20/2021 12/30/2018 12/02/2018 11/28/2018  I have been able to laugh and see the funny side of things. 0 0 0 2 0  I have looked forward with enjoyment to things. 0 0 0 3 0  I have blamed myself unnecessarily when things went wrong. 0 0 0 3 0  I have been anxious or worried for no good reason. 0 1 0 3 0  I have felt scared or panicky for no good reason. 0 0 0 0 0  Things have been getting on top of me. 0 0 1 2 0  I have been so unhappy that I have had difficulty sleeping. 0 0 0 1 0  I have  felt sad or miserable. 0 0 0 2 0  I have been so unhappy that I have been crying. 0 0 0 2 0  The thought of harming myself has occurred to me. 0 0 0 0 0  Edinburgh Postnatal Depression Scale Total 0 1 1 18  0        Assessment and Plan:  Screening for postpartum depression Postpartum state S/p vaginal delivery  Follow Up Instructions:  Follow up in 4 weeks for final postpartum visit and Nexplanon insertion   I discussed the assessment and treatment plan with the patient. The patient was provided an opportunity to ask questions and all were answered. The patient agreed with the plan and demonstrated an understanding of the instructions.   The patient was advised to call back or seek an in-person evaluation if the symptoms worsen or if the condition fails to improve as anticipated.  I provided 6 minutes of non-face-to-face time during this encounter.   11/30/2018, MD   .     , MD [facility]

## 2021-06-11 NOTE — Progress Notes (Addendum)
OBSTETRICS POSTPARTUM CLINIC PROGRESS NOTE  Subjective:     Marcia Hodges is a 25 y.o. [phone] female who presents for a postpartum visit. She is 6 week postpartum following a spontaneous vaginal delivery. I have fully reviewed the prenatal and intrapartum course. The delivery was at 40 gestational weeks.  Anesthesia: none. Postpartum course has been uncomplicated. Baby's course has been uncomplicated. Baby is feeding by bottle - Gerber Goodstart Gentle . Bleeding: patient has resumed menses, with No LMP recorded.. Bowel function is normal. Bladder function is normal. Patient is sexually active. Contraception method desired is Nexplanon and partner will get a vasectomy. Postpartum depression screening: negative.  EDPS score is 0.    The following portions of the patient's history were reviewed and updated as appropriate: allergies, current medications, past family history, past medical history, past social history, past surgical history, and problem list.  Review of Systems Pertinent items noted in HPI and remainder of comprehensive ROS otherwise negative.   Objective:    BP 118/78   Pulse 81   Resp 16   Ht 5\' 2"  (1.575 m)   Wt 146 lb 12.8 oz (66.6 kg)   Breastfeeding No   BMI 26.85 kg/m   General:  alert and no distress   Breasts:  inspection negative, no nipple discharge or bleeding, no masses or nodularity palpable  Lungs: clear to auscultation bilaterally  Heart:  regular rate and rhythm, S1, S2 normal, no murmur, click, rub or gallop  Abdomen: soft, non-tender; bowel sounds normal; no masses,  no organomegaly.     Vulva:  normal  Vagina: normal vagina, no discharge, exudate, lesion, or erythema  Cervix:  no cervical motion tenderness and no lesions  Corpus: normal size, contour, position, consistency, mobility, non-tender  Adnexa:  normal adnexa and no mass, fullness, tenderness  Rectal Exam: Not performed.         Labs:  Lab Results  Component Value Date   HGB  10.7 (L) 04/21/2021     Assessment:   1. Postpartum care following vaginal delivery   2. Insertion of Nexplanon      Plan:   1. Contraception: Nexplanon (see procedure note below) and partner vasectomy 2. Follow up in:  4-6  months or as needed.     GYNECOLOGY OFFICE PROCEDURE NOTE  Marcia Hodges is a 25 y.o. [phone] here for Nexplanon insertion.  Last pap smear was on 10/12/2020 and was normal.  No other gynecologic concerns.  Nexplanon Insertion Procedure Patient identified, informed consent performed, consent signed.   Patient does understand that irregular bleeding is a very common side effect of this medication. She was advised to have backup contraception for one week after placement. Pregnancy test in clinic today was negative.  Appropriate time out taken.  Patient's left arm was prepped and draped in the usual sterile fashion. The ruler used to measure and mark insertion area.  Patient was prepped with alcohol swab and then injected with 3 ml of 1% lidocaine.  She was prepped with betadine, Nexplanon removed from packaging,  Device confirmed in needle, then inserted full length of needle and withdrawn per handbook instructions. Nexplanon was able to palpated in the patient's arm; patient palpated the insert herself. There was minimal blood loss.  Patient insertion site covered with guaze and a pressure bandage to reduce any bruising.  The patient tolerated the procedure well and was given post procedure instructions.     Exp: 03/17/2023 Lot: 05/17/2023  [id number], MD [facility]

## 2021-06-12 ENCOUNTER — Other Ambulatory Visit: Payer: Self-pay

## 2021-06-12 ENCOUNTER — Ambulatory Visit (INDEPENDENT_AMBULATORY_CARE_PROVIDER_SITE_OTHER): Payer: BC Managed Care – PPO | Admitting: Obstetrics and Gynecology

## 2021-06-12 ENCOUNTER — Encounter: Payer: Self-pay | Admitting: Obstetrics and Gynecology

## 2021-06-12 DIAGNOSIS — Z30017 Encounter for initial prescription of implantable subdermal contraceptive: Secondary | ICD-10-CM

## 2021-06-12 NOTE — Patient Instructions (Addendum)

## 2021-10-11 ENCOUNTER — Encounter: Payer: Medicaid Other | Admitting: Obstetrics and Gynecology

## 2021-10-11 NOTE — Progress Notes (Deleted)
? ? ?GYNECOLOGY ANNUAL PHYSICAL EXAM PROGRESS NOTE ? ?Subjective:  ? ? Marcia Hodges is a 26 y.o. [phone] female who presents for an annual exam. The patient has no complaints today. The patient {is/is not/has never been:13135} sexually active. The patient participates in regular exercise: {yes/no/not asked:9010}. Has the patient ever been transfused or tattooed?: {yes/no/not asked:9010}. The patient reports that there {is/is not:9024} domestic violence in her life.  ? ? ?Menstrual History: ?Menarche age: *** ?No LMP recorded. ?  ? ? ?Gynecologic History:  ?Contraception: {method:5051} ?History of STI's:  ?Last Pap: 10/12/2020. Results were: normal.  Denies Notes h/o abnormal pap smears. ?Last mammogram: Not age appropriate ? ? ? ? ? ?OB History  ?Gravida Para Term Preterm AB Living  ?3 3 3  0 0 3  ?SAB IAB Ectopic Multiple Live Births  ?0 0 0 0 3  ?  ?# Outcome Date GA Lbr Len/2nd Weight Sex Delivery Anes PTL Lv  ?3 Term 04/20/21 [redacted]w[redacted]d 06:05 / 00:02 6 lb 1.7 oz (2.77 kg) M Vag-Spont None  LIV  ?   Name: BRIGHID, VONDRACEK  ?   Apgar1: 9  Apgar5: 9  ?2 Term 11/26/18 [redacted]w[redacted]d / 00:09 7 lb 5.8 oz (3.34 kg) M Vag-Spont EPI  LIV  ?   Name: CARMITA, VERSTEEG  ?   Apgar1: 9  Apgar5: 9  ?1 Term 04/25/16 [redacted]w[redacted]d  7 lb 1.2 oz (3.21 kg) M Vag-Spont EPI  LIV  ?   Name: XOLANI, GRUNDSTROM  ?   Apgar1: 7  Apgar5: 9  ? ? ?Past Medical History:  ?Diagnosis Date  ? Abnormal breast exam   ? 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 Allergic rhinitis   ? Hematuria   ? Medical history non-contributory   ? ? ?Past Surgical History:  ?Procedure Laterality Date  ? wisdom teeth pulled    ? ? ?Family History  ?Problem Relation Age of Onset  ? Hypertension Father   ? Diabetes Neg Hx   ? Cancer Neg Hx   ? Hearing loss Neg Hx   ? ? ?Social History  ? ?Socioeconomic History  ? Marital status: Single  ?  Spouse name: Joey  ? Number of children: Not on file  ? Years of education: Not on file  ? Highest  education level: Not on file  ?Occupational History  ? Not on file  ?Tobacco Use  ? Smoking status: Never  ? Smokeless tobacco: Never  ?Vaping Use  ? Vaping Use: Former  ?Substance and Sexual Activity  ? Alcohol use: No  ?  Alcohol/week: 0.0 standard drinks  ? Drug use: No  ? Sexual activity: Yes  ?  Partners: Male  ?  Birth control/protection: None  ?Other Topics Concern  ? Not on file  ?Social History Narrative  ? Not on file  ? ?Social Determinants of Health  ? ?Financial Resource Strain: Not on file  ?Food Insecurity: Not on file  ?Transportation Needs: Not on file  ?Physical Activity: Not on file  ?Stress: Not on file  ?Social Connections: Not on file  ?Intimate Partner Violence: Not on file  ? ? ?Current Outpatient Medications on File Prior to Visit  ?Medication Sig Dispense Refill  ? Prenatal Vit-Fe Fumarate-FA (MULTIVITAMIN-PRENATAL) 27-0.8 MG TABS tablet Take 1 tablet by mouth daily at 12 noon.    ? ?No current facility-administered medications on file prior to visit.  ? ? ?Allergies  ?Allergen Reactions  ? Femoxetine   ?  Ferumoxytol Rash  ? ? ? ?Review of Systems ?Constitutional: negative for chills, fatigue, fevers and sweats ?Eyes: negative for irritation, redness and visual disturbance ?Ears, nose, mouth, throat, and face: negative for hearing loss, nasal congestion, snoring and tinnitus ?Respiratory: negative for asthma, cough, sputum ?Cardiovascular: negative for chest pain, dyspnea, exertional chest pressure/discomfort, irregular heart beat, palpitations and syncope ?Gastrointestinal: negative for abdominal pain, change in bowel habits, nausea and vomiting ?Genitourinary: negative for abnormal menstrual periods, genital lesions, sexual problems and vaginal discharge, dysuria and urinary incontinence ?Integument/breast: negative for breast lump, breast tenderness and nipple discharge ?Hematologic/lymphatic: negative for bleeding and easy bruising ?Musculoskeletal:negative for back pain and muscle  weakness ?Neurological: negative for dizziness, headaches, vertigo and weakness ?Endocrine: negative for diabetic symptoms including polydipsia, polyuria and skin dryness ?Allergic/Immunologic: negative for hay fever and urticaria    ? ? ?Objective:  ?not currently breastfeeding. There is no height or weight on file to calculate BMI. ? ?  ?General Appearance:    Alert, cooperative, no distress, appears stated age  ?Head:    Normocephalic, without obvious abnormality, atraumatic  ?Eyes:    PERRL, conjunctiva/corneas clear, EOM's intact, both eyes  ?Ears:    Normal external ear canals, both ears  ?Nose:   Nares normal, septum midline, mucosa normal, no drainage or sinus tenderness  ?Throat:   Lips, mucosa, and tongue normal; teeth and gums normal  ?Neck:   Supple, symmetrical, trachea midline, no adenopathy; thyroid: no enlargement/tenderness/nodules; no carotid bruit or JVD  ?Back:     Symmetric, no curvature, ROM normal, no CVA tenderness  ?Lungs:     Clear to auscultation bilaterally, respirations unlabored  ?Chest Wall:    No tenderness or deformity  ? Heart:    Regular rate and rhythm, S1 and S2 normal, no murmur, rub or gallop  ?Breast Exam:    No tenderness, masses, or nipple abnormality  ?Abdomen:     Soft, non-tender, bowel sounds active all four quadrants, no masses, no organomegaly.    ?Genitalia:    Pelvic:external genitalia normal, vagina without lesions, discharge, or tenderness, rectovaginal septum  normal. Cervix normal in appearance, no cervical motion tenderness, no adnexal masses or tenderness.  Uterus normal size, shape, mobile, regular contours, nontender.  ?Rectal:    Normal external sphincter.  No hemorrhoids appreciated. Internal exam not done.   ?Extremities:   Extremities normal, atraumatic, no cyanosis or edema  ?Pulses:   2+ and symmetric all extremities  ?Skin:   Skin color, texture, turgor normal, no rashes or lesions  ?Lymph nodes:   Cervical, supraclavicular, and axillary nodes normal   ?Neurologic:   CNII-XII intact, normal strength, sensation and reflexes throughout  ? ?. ? ?Labs:  ?Lab Results  ?Component Value Date  ? WBC 12.7 (H) 04/21/2021  ? HGB 10.7 (L) 04/21/2021  ? HCT 35.4 (L) 04/21/2021  ? MCV 72.0 (L) 04/21/2021  ? PLT 260 04/21/2021  ? ? ?Lab Results  ?Component Value Date  ? CREATININE 0.72 07/29/2019  ? BUN 12 07/29/2019  ? NA 137 07/29/2019  ? K 3.8 07/29/2019  ? CL 107 07/29/2019  ? CO2 23 07/29/2019  ? ? ?Lab Results  ?Component Value Date  ? ALT 21 03/27/2018  ? AST 19 03/27/2018  ? ALKPHOS 66 03/27/2018  ? BILITOT 0.4 03/27/2018  ? ? ?Lab Results  ?Component Value Date  ? TSH 0.962 10/15/2018  ? ? ? ?Assessment:  ? ?No diagnosis found. ?  ?Plan:  ?Blood tests: {blood tests:13147}. ?Breast self  exam technique reviewed and patient encouraged to perform self-exam monthly. ?Contraception: {contraceptive methods:5051}. ?Discussed healthy lifestyle modifications. ?Mammogram {discussed/ordered:14545} ?Pap smear  UTD . ?COVID vaccination status: ?Follow up in 1 year for annual exam ? ? ?Rubie Maid, MD ?[facility] ? ?

## 2021-10-11 NOTE — Patient Instructions (Incomplete)
Breast Self-Awareness Breast self-awareness means being familiar with how your breasts look and feel. It involves checking your breasts regularly and reporting any changes to your health care provider. Practicing breast self-awareness is important. Sometimes changes may not be harmful (are benign), but sometimes a change in your breasts can be a sign of a serious medical problem. It is important to learn how to do this procedure correctly so that you can catch problems early, when treatment is more likely to be successful. All women should practice breast self-awareness, including women who have had breast implants. What you need: A mirror. A well-lit room. How to do a breast self-exam A breast self-exam is one way to learn what is normal for your breasts and whether your breasts are changing. To do a breast self-exam: Look for changes  Remove all the clothing above your waist. Stand in front of a mirror in a room with good lighting. Put your hands on your hips. Push your hands firmly downward. Compare your breasts in the mirror. Look for differences between them (asymmetry), such as: Differences in shape. Differences in size. Puckers, dips, and bumps in one breast and not the other. Look at each breast for changes in the skin, such as: Redness. Scaly areas. Look for changes in your nipples, such as: Discharge. Bleeding. Dimpling. Redness. A change in position. Feel for changes Carefully feel your breasts for lumps and changes. It is best to do this while lying on your back on the floor, and again while sitting or standing in the tub or shower with soapy water on your skin. Feel each breast in the following way: Place the arm on the side of the breast you are examining above your head. Feel your breast with the other hand. Start in the nipple area and make -inch (2 cm) overlapping circles to feel your breast. Use the pads of your three middle fingers to do this. Apply light pressure,  then medium pressure, then firm pressure. The light pressure will allow you to feel the tissue closest to the skin. The medium pressure will allow you to feel the tissue that is a little deeper. The firm pressure will allow you to feel the tissue close to the ribs. Continue the overlapping circles, moving downward over the breast until you feel your ribs below your breast. Move one finger-width toward the center of the body. Continue to use the -inch (2 cm) overlapping circles to feel your breast as you move slowly up toward your collarbone. Continue the up-and-down exam using all three pressures until you reach your armpit.  Write down what you find Writing down what you find can help you remember what to discuss with your health care provider. Write down: What is normal for each breast. Any changes that you find in each breast, including: The kind of changes you find. Any pain or tenderness. Size and location of any lumps. Where you are in your menstrual cycle, if you are still menstruating. General tips and recommendations Examine your breasts every month. If you are breastfeeding, the best time to examine your breasts is after a feeding or after using a breast pump. If you menstruate, the best time to examine your breasts is 5-7 days after your period. Breasts are generally lumpier during menstrual periods, and it may be more difficult to notice changes. With time and practice, you will become more familiar with the variations in your breasts and more comfortable with the exam. Contact a health care provider if you:  See a change in the shape or size of your breasts or nipples. °See a change in the skin of your breast or nipples, such as a reddened or scaly area. °Have unusual discharge from your nipples. °Find a lump or thick area that was not there before. °Have pain in your breasts. °Have any concerns related to your breast health. °Summary °Breast self-awareness includes looking for  physical changes in your breasts, as well as feeling for any changes within your breasts. °Breast self-awareness should be performed in front of a mirror in a well-lit room. °You should examine your breasts every month. If you menstruate, the best time to examine your breasts is 5-7 days after your menstrual period. °Let your health care provider know of any changes you notice in your breasts, including changes in size, changes on the skin, pain or tenderness, or unusual fluid from your nipples. °This information is not intended to replace advice given to you by your health care provider. Make sure you discuss any questions you have with your health care provider. °Document Revised: 03/10/2018 Document Reviewed: 03/10/2018 °Elsevier Patient Education © 2022 [employer] Inc. °Preventive Care 21-39 Years Old, Female °Preventive care refers to lifestyle choices and visits with your health care provider that can promote health and wellness. Preventive care visits are also called wellness exams. °What can I expect for my preventive care visit? °Counseling °During your preventive care visit, your health care provider may ask about your: °Medical history, including: °Past medical problems. °Family medical history. °Pregnancy history. °Current health, including: °Menstrual cycle. °Method of birth control. °Emotional well-being. °Home life and relationship well-being. °Sexual activity and sexual health. °Lifestyle, including: °Alcohol, nicotine or tobacco, and drug use. °Access to firearms. °Diet, exercise, and sleep habits. °Work and work environment. °Sunscreen use. °Safety issues such as seatbelt and bike helmet use. °Physical exam °Your health care provider may check your: °Height and weight. These may be used to calculate your BMI (body mass index). BMI is a measurement that tells if you are at a healthy weight. °Waist circumference. This measures the distance around your waistline. This measurement also tells if you are  at a healthy weight and may help predict your risk of certain diseases, such as type 2 diabetes and high blood pressure. °Heart rate and blood pressure. °Body temperature. °Skin for abnormal spots. °What immunizations do I need? °Vaccines are usually given at various ages, according to a schedule. Your health care provider will recommend vaccines for you based on your age, medical history, and lifestyle or other factors, such as travel or where you work. °What tests do I need? °Screening °Your health care provider may recommend screening tests for certain conditions. This may include: °Pelvic exam and Pap test. °Lipid and cholesterol levels. °Diabetes screening. This is done by checking your blood sugar (glucose) after you have not eaten for a while (fasting). °Hepatitis B test. °Hepatitis C test. °HIV (human immunodeficiency virus) test. °STI (sexually transmitted infection) testing, if you are at risk. °BRCA-related cancer screening. This may be done if you have a family history of breast, ovarian, tubal, or peritoneal cancers. °Talk with your health care provider about your test results, treatment options, and if necessary, the need for more tests. °Follow these instructions at home: °Eating and drinking ° °Eat a healthy diet that includes fresh fruits and vegetables, whole grains, lean protein, and low-fat dairy products. °Take vitamin and mineral supplements as recommended by your health care provider. °Do not drink alcohol if: °Your health care   provider tells you not to drink. You are pregnant, may be pregnant, or are planning to become pregnant. If you drink alcohol: Limit how much you have to 0-1 drink a day. Know how much alcohol is in your drink. In the [place] one drink equals one 12 oz bottle of beer (355 mL), one 5 oz glass of wine (148 mL), or one 1 oz glass of hard liquor (44 mL). Lifestyle Brush your teeth every morning and night with fluoride toothpaste. Floss one time each day. Exercise for  at least 30 minutes 5 or more days each week. Do not use any products that contain nicotine or tobacco. These products include cigarettes, chewing tobacco, and vaping devices, such as e-cigarettes. If you need help quitting, ask your health care provider. Do not use drugs. If you are sexually active, practice safe sex. Use a condom or other form of protection to prevent STIs. If you do not wish to become pregnant, use a form of birth control. If you plan to become pregnant, see your health care provider for a prepregnancy visit. Find healthy ways to manage stress, such as: Meditation, yoga, or listening to music. Journaling. Talking to a trusted person. Spending time with friends and family. Minimize exposure to UV radiation to reduce your risk of skin cancer. Safety Always wear your seat belt while driving or riding in a vehicle. Do not drive: If you have been drinking alcohol. Do not ride with someone who has been drinking. If you have been using any mind-altering substances or drugs. While texting. When you are tired or distracted. Wear a helmet and other protective equipment during sports activities. If you have firearms in your house, make sure you follow all gun safety procedures. Seek help if you have been physically or sexually abused. What's next? Go to your health care provider once a year for an annual wellness visit. Ask your health care provider how often you should have your eyes and teeth checked. Stay up to date on all vaccines. This information is not intended to replace advice given to you by your health care provider. Make sure you discuss any questions you have with your health care provider. Document Revised: 01/17/2021 Document Reviewed: 01/17/2021 Elsevier Patient Education  [employer].

## 2021-11-25 ENCOUNTER — Ambulatory Visit: Payer: Self-pay

## 2022-05-29 ENCOUNTER — Ambulatory Visit (INDEPENDENT_AMBULATORY_CARE_PROVIDER_SITE_OTHER): Payer: BC Managed Care – PPO | Admitting: Obstetrics and Gynecology

## 2022-05-29 ENCOUNTER — Encounter: Payer: Self-pay | Admitting: Obstetrics and Gynecology

## 2022-05-29 VITALS — BP 103/64 | HR 84 | Resp 16 | Ht 62.0 in | Wt 142.9 lb

## 2022-05-29 DIAGNOSIS — Z3046 Encounter for surveillance of implantable subdermal contraceptive: Secondary | ICD-10-CM | POA: Diagnosis not present

## 2022-05-29 NOTE — Progress Notes (Signed)
      GYNECOLOGY OFFICE PROCEDURE NOTE  Marcia Hodges is a 26 y.o. [phone] here for Nexplanon removal. Last pap smear was on 10/12/2020 and was normal.  No other gynecologic concerns.   Nexplanon Removal Patient identified, informed consent performed, consent signed.   Appropriate time out taken. Nexplanon site identified.  Area prepped in usual sterile fashon. One ml of 1% lidocaine was used to anesthetize the area at the distal end of the implant. A small stab incision was made right beside the implant on the distal portion.  The Nexplanon rod was grasped using hemostats and removed without difficulty.  There was minimal blood loss. There were no complications.  3 ml of 1% lidocaine was injected around the incision for post-procedure analgesia.  Steri-strips were applied over the small incision.  A pressure bandage was applied to reduce any bruising.  The patient tolerated the procedure well and was given post procedure instructions.  Patient is planning to attempt conception.    Rubie Maid, MD [place]

## 2022-05-30 ENCOUNTER — Encounter: Payer: Self-pay | Admitting: Obstetrics and Gynecology

## 2022-07-09 NOTE — Progress Notes (Unsigned)
GYNECOLOGY ANNUAL PHYSICAL EXAM PROGRESS NOTE  Subjective:    Marcia Hodges is a 26 y.o. [phone] female who presents for an annual exam. The patient has no complaints today. The patient {is/is not/has never been:13135} sexually active. The patient participates in regular exercise: {yes/no/not asked:9010}. Has the patient ever been transfused or tattooed?: {yes/no/not asked:9010}. The patient reports that there {is/is not:9024} domestic violence in her life.    Menstrual History: Menarche age: *** No LMP recorded.     Gynecologic History:  Contraception: {method:5051} History of STI's:  Last Pap: ***. Results were: {norm/abn:16337}.  ***Denies/Notes h/o abnormal pap smears. Last mammogram: ***. Results were: {norm/abn:16337}       OB History  Gravida Para Term Preterm AB Living  3 3 3  0 0 3  SAB IAB Ectopic Multiple Live Births  0 0 0 0 3    # Outcome Date GA Lbr Len/2nd Weight Sex Delivery Anes PTL Lv  3 Term 04/20/21 [redacted]w[redacted]d 06:05 / 00:02 6 lb 1.7 oz (2.77 kg) M Vag-Spont None  LIV     Name: MARNEY, JULIA     Apgar1: 9  Apgar5: 9  2 Term 11/26/18 [redacted]w[redacted]d / 00:09 7 lb 5.8 oz (3.34 kg) M Vag-Spont EPI  LIV     Name: TAREN, LAIBLE     Apgar1: 9  Apgar5: 9  1 Term 04/25/16 [redacted]w[redacted]d  7 lb 1.2 oz (3.21 kg) M Vag-Spont EPI  LIV     Name: Gasior,BOY Larcenia     Apgar1: 7  Apgar5: 9    Past Medical History:  Diagnosis Date   Abnormal breast exam    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   Allergic rhinitis    Hematuria    Medical history non-contributory     Past Surgical History:  Procedure Laterality Date   wisdom teeth pulled      Family History  Problem Relation Age of Onset   Hypertension Father    Diabetes Neg Hx    Cancer Neg Hx    Hearing loss Neg Hx     Social History   Socioeconomic History   Marital status: Single    Spouse name: Joey   Number of children: Not on file   Years of education: Not  on file   Highest education level: Not on file  Occupational History   Not on file  Tobacco Use   Smoking status: Never   Smokeless tobacco: Never  Vaping Use   Vaping Use: Former  Substance and Sexual Activity   Alcohol use: No    Alcohol/week: 0.0 standard drinks of alcohol   Drug use: No   Sexual activity: Yes    Partners: Male    Birth control/protection: None  Other Topics Concern   Not on file  Social History Narrative   Not on file   Social Determinants of Health   Financial Resource Strain: Low Risk  (11/26/2018)   Overall Financial Resource Strain (CARDIA)    Difficulty of Paying Living Expenses: Not hard at all  Food Insecurity: No Food Insecurity (11/26/2018)   Hunger Vital Sign    Worried About Running Out of Food in the Last Year: Never true    Ran Out of Food in the Last Year: Never true  Transportation Needs: No Transportation Needs (11/26/2018)   PRAPARE - Hydrologist (Medical): No    Lack of Transportation (Non-Medical): No  Physical Activity: Insufficiently Active (11/26/2018)  Exercise Vital Sign    Days of Exercise per Week: 3 days    Minutes of Exercise per Session: 30 min  Stress: No Stress Concern Present (11/26/2018)   [place]    Feeling of Stress : Not at all  Social Connections: Unknown (11/26/2018)   Social Connection and Isolation Panel [NHANES]    Frequency of Communication with Friends and Family: Patient refused    Frequency of Social Gatherings with Friends and Family: Patient refused    Attends Religious Services: Patient refused    Active Member of Clubs or Organizations: Patient refused    Attends Archivist Meetings: Patient refused    Marital Status: Patient refused  Intimate Partner Violence: Not At Risk (11/26/2018)   Humiliation, Afraid, Rape, and Kick questionnaire    Fear of Current or Ex-Partner: No    Emotionally Abused:  No    Physically Abused: No    Sexually Abused: No    No current outpatient medications on file prior to visit.   No current facility-administered medications on file prior to visit.    Allergies  Allergen Reactions   Femoxetine    Ferumoxytol Rash     Review of Systems Constitutional: negative for chills, fatigue, fevers and sweats Eyes: negative for irritation, redness and visual disturbance Ears, nose, mouth, throat, and face: negative for hearing loss, nasal congestion, snoring and tinnitus Respiratory: negative for asthma, cough, sputum Cardiovascular: negative for chest pain, dyspnea, exertional chest pressure/discomfort, irregular heart beat, palpitations and syncope Gastrointestinal: negative for abdominal pain, change in bowel habits, nausea and vomiting Genitourinary: negative for abnormal menstrual periods, genital lesions, sexual problems and vaginal discharge, dysuria and urinary incontinence Integument/breast: negative for breast lump, breast tenderness and nipple discharge Hematologic/lymphatic: negative for bleeding and easy bruising Musculoskeletal:negative for back pain and muscle weakness Neurological: negative for dizziness, headaches, vertigo and weakness Endocrine: negative for diabetic symptoms including polydipsia, polyuria and skin dryness Allergic/Immunologic: negative for hay fever and urticaria      Objective:  not currently breastfeeding. There is no height or weight on file to calculate BMI.    General Appearance:    Alert, cooperative, no distress, appears stated age  Head:    Normocephalic, without obvious abnormality, atraumatic  Eyes:    PERRL, conjunctiva/corneas clear, EOM's intact, both eyes  Ears:    Normal external ear canals, both ears  Nose:   Nares normal, septum midline, mucosa normal, no drainage or sinus tenderness  Throat:   Lips, mucosa, and tongue normal; teeth and gums normal  Neck:   Supple, symmetrical, trachea midline, no  adenopathy; thyroid: no enlargement/tenderness/nodules; no carotid bruit or JVD  Back:     Symmetric, no curvature, ROM normal, no CVA tenderness  Lungs:     Clear to auscultation bilaterally, respirations unlabored  Chest Wall:    No tenderness or deformity   Heart:    Regular rate and rhythm, S1 and S2 normal, no murmur, rub or gallop  Breast Exam:    No tenderness, masses, or nipple abnormality  Abdomen:     Soft, non-tender, bowel sounds active all four quadrants, no masses, no organomegaly.    Genitalia:    Pelvic:external genitalia normal, vagina without lesions, discharge, or tenderness, rectovaginal septum  normal. Cervix normal in appearance, no cervical motion tenderness, no adnexal masses or tenderness.  Uterus normal size, shape, mobile, regular contours, nontender.  Rectal:    Normal external sphincter.  No hemorrhoids appreciated.  Internal exam not done.   Extremities:   Extremities normal, atraumatic, no cyanosis or edema  Pulses:   2+ and symmetric all extremities  Skin:   Skin color, texture, turgor normal, no rashes or lesions  Lymph nodes:   Cervical, supraclavicular, and axillary nodes normal  Neurologic:   CNII-XII intact, normal strength, sensation and reflexes throughout   .  Labs:  Lab Results  Component Value Date   WBC 12.7 (H) 04/21/2021   HGB 10.7 (L) 04/21/2021   HCT 35.4 (L) 04/21/2021   MCV 72.0 (L) 04/21/2021   PLT 260 04/21/2021    Lab Results  Component Value Date   CREATININE 0.72 07/29/2019   BUN 12 07/29/2019   NA 137 07/29/2019   K 3.8 07/29/2019   CL 107 07/29/2019   CO2 23 07/29/2019    Lab Results  Component Value Date   ALT 21 03/27/2018   AST 19 03/27/2018   ALKPHOS 66 03/27/2018   BILITOT 0.4 03/27/2018    Lab Results  Component Value Date   TSH 0.962 10/15/2018     Assessment:   No diagnosis found.   Plan:  Blood tests: {blood tests:13147}. Breast self exam technique reviewed and patient encouraged to perform  self-exam monthly. Contraception: {contraceptive methods:5051}. Discussed healthy lifestyle modifications. Mammogram {discussed/ordered:14545} Pap smear {discussed/ordered:14545}. COVID vaccination status: Follow up in 1 year for annual exam   Loney Laurence, [facility]

## 2022-07-10 ENCOUNTER — Encounter: Payer: Self-pay | Admitting: Obstetrics and Gynecology

## 2022-07-10 ENCOUNTER — Ambulatory Visit (INDEPENDENT_AMBULATORY_CARE_PROVIDER_SITE_OTHER): Payer: BC Managed Care – PPO | Admitting: Obstetrics and Gynecology

## 2022-07-10 VITALS — BP 105/76 | HR 85 | Resp 16 | Ht 62.0 in | Wt 145.2 lb

## 2022-07-10 DIAGNOSIS — N926 Irregular menstruation, unspecified: Secondary | ICD-10-CM

## 2022-07-10 DIAGNOSIS — Z30011 Encounter for initial prescription of contraceptive pills: Secondary | ICD-10-CM | POA: Diagnosis not present

## 2022-07-10 DIAGNOSIS — Z01419 Encounter for gynecological examination (general) (routine) without abnormal findings: Secondary | ICD-10-CM | POA: Diagnosis not present

## 2022-07-10 MED ORDER — SLYND 4 MG PO TABS: 1.0000 | ORAL_TABLET | Freq: Every day | ORAL | 3 refills | Status: DC

## 2022-07-11 ENCOUNTER — Encounter: Payer: Self-pay | Admitting: Obstetrics and Gynecology

## 2022-07-11 LAB — COMPREHENSIVE METABOLIC PANEL
ALT: 21 IU/L (ref 0–32)
AST: 16 IU/L (ref 0–40)
Albumin/Globulin Ratio: 1.6 (ref 1.2–2.2)
Albumin: 4.7 g/dL (ref 4.0–5.0)
Alkaline Phosphatase: 90 IU/L (ref 44–121)
BUN/Creatinine Ratio: 12 (ref 9–23)
BUN: 8 mg/dL (ref 6–20)
Bilirubin Total: 0.4 mg/dL (ref 0.0–1.2)
CO2: 23 mmol/L (ref 20–29)
Calcium: 9.8 mg/dL (ref 8.7–10.2)
Chloride: 100 mmol/L (ref 96–106)
Creatinine, Ser: 0.66 mg/dL (ref 0.57–1.00)
Globulin, Total: 2.9 g/dL (ref 1.5–4.5)
Glucose: 69 mg/dL — ABNORMAL LOW (ref 70–99)
Potassium: 4.3 mmol/L (ref 3.5–5.2)
Sodium: 139 mmol/L (ref 134–144)
Total Protein: 7.6 g/dL (ref 6.0–8.5)
eGFR: 123 mL/min/{1.73_m2} (ref >59)

## 2022-07-11 LAB — CBC
Hematocrit: 41.7 % (ref 34.0–46.6)
Hemoglobin: 13.7 g/dL (ref 11.1–15.9)
MCH: 27.9 pg (ref 26.6–33.0)
MCHC: 32.9 g/dL (ref 31.5–35.7)
MCV: 85 fL (ref 79–97)
Platelets: 349 10*3/uL (ref 150–450)
RBC: 4.91 x10E6/uL (ref 3.77–5.28)
RDW: 13.1 % (ref 11.7–15.4)
WBC: 8 10*3/uL (ref 3.4–10.8)

## 2022-07-11 LAB — TSH: TSH: 0.922 u[IU]/mL (ref 0.450–4.500)

## 2022-07-11 MED ORDER — NORETHINDRONE 0.35 MG PO TABS: 1.0000 | ORAL_TABLET | Freq: Every day | ORAL | 3 refills | Status: DC

## 2022-07-18 ENCOUNTER — Ambulatory Visit: Payer: Self-pay

## 2022-08-05 NOTE — L&D Delivery Note (Signed)
Delivery Note   Alaysiah Browder Kearse is a 27 y.o. [id number] at [redacted]w[redacted]d Estimated Date of Delivery: 05/19/23  PRE-OPERATIVE DIAGNOSIS:  1) [redacted]w[redacted]d pregnancy.  2) A1GDM  POST-OPERATIVE DIAGNOSIS:  1) [redacted]w[redacted]d pregnancy s/p Vaginal, Spontaneous   Delivery Type: Vaginal, Spontaneous   Delivery Anesthesia: Epidural  Labor Complications:  dysfunctional contraction pattern    ESTIMATED BLOOD LOSS: 200 ml    FINDINGS:   1) female infant, Apgar scores of 8   at 1 minute and 9   at 5 minutes and a birthweight pending per protocol.     SPECIMENS:   PLACENTA:   Appearance: Intact   Removal: Spontaneous     Disposition:  discarded  CORD BLOOD: not collected  DISPOSITION:  Infant left in stable condition in the delivery room, with L&D personnel and mother,  NARRATIVE SUMMARY: Labor course:  Cooper Stamp is a [id number] at [redacted]w[redacted]d who presented to Labor & Delivery for labor management. Her initial cervical exam was 4/60/-2. Labor proceeded with pitocin augmentation and she was found to be completely dilated at 0730. With excellent maternal pushing effort, she birthed a viable female infant at 48. There was not a nuchal cord. The shoulders were birthed without difficulty. The infant was placed skin-to-skin with mother. The cord was doubly clamped and cut when pulsations ceased. The placenta delivered spontaneously and was noted to be intact with a 3VC. A perineal and vaginal examination was performed. Episiotomy/Lacerations: Labial Lacerations were repaired with Vicryl suture using (local/epidural) anesthesia. The patient tolerated this well. Mother and baby were left in stable condition.   [name], CNM 05/11/2023 8:34 AM

## 2022-09-19 ENCOUNTER — Ambulatory Visit (INDEPENDENT_AMBULATORY_CARE_PROVIDER_SITE_OTHER): Payer: Medicaid Other

## 2022-09-19 VITALS — BP 120/80 | Ht 62.0 in | Wt 141.0 lb

## 2022-09-19 DIAGNOSIS — Z3201 Encounter for pregnancy test, result positive: Secondary | ICD-10-CM

## 2022-09-19 DIAGNOSIS — N912 Amenorrhea, unspecified: Secondary | ICD-10-CM

## 2022-09-19 LAB — POCT URINE PREGNANCY: Preg Test, Ur: POSITIVE — AB

## 2022-09-19 NOTE — Progress Notes (Signed)
    NURSE VISIT NOTE  Subjective:    Patient ID: Marcia Hodges, female    DOB: February 07, 1996, 27 y.o.   MRN: [id number]  HPI  Patient is a 27 y.o. [id number] female who presents for evaluation of amenorrhea. She believes she could be pregnant. Pregnancy is desired. Sexual Activity: has sex with males. Current symptoms also include: fatigue. Last period was normal.    Objective:    BP 120/80   Ht 5\' 2"  (1.575 m)   Wt 141 lb (64 kg)   LMP 08/12/2022   BMI 25.79 kg/m   Lab Review  Results for orders placed or performed in visit on 09/19/22  POCT urine pregnancy  Result Value Ref Range   Preg Test, Ur Positive (A) Negative    Assessment:   1. Amenorrhea     Plan:   Pregnancy Test: Positive   EDD 05/19/23   Quintella Baton, CMA

## 2022-10-11 ENCOUNTER — Ambulatory Visit (INDEPENDENT_AMBULATORY_CARE_PROVIDER_SITE_OTHER): Payer: Medicaid Other

## 2022-10-11 VITALS — Ht 62.0 in | Wt 142.0 lb

## 2022-10-11 DIAGNOSIS — Z3687 Encounter for antenatal screening for uncertain dates: Secondary | ICD-10-CM

## 2022-10-11 DIAGNOSIS — Z3480 Encounter for supervision of other normal pregnancy, unspecified trimester: Secondary | ICD-10-CM

## 2022-10-11 DIAGNOSIS — Z113 Encounter for screening for infections with a predominantly sexual mode of transmission: Secondary | ICD-10-CM

## 2022-10-11 NOTE — Progress Notes (Signed)
New OB Intake  I connected with  Marcia Hodges on 10/11/22 at  1:15 PM EST by telephone Video Visit and verified that I am speaking with the correct person using two identifiers. Nurse is located at [employer] and pt is located at home.  I discussed the limitations, risks, security and privacy concerns of performing an evaluation and management service by telephone and the availability of in person appointments. I also discussed with the patient that there may be a patient responsible charge related to this service. The patient expressed understanding and agreed to proceed.  I explained I am completing New OB Intake today. We discussed her EDD of 05/19/2023  that is based on LMP of 08/12/2022. Pt is G4/P3. I reviewed her allergies, medications, Medical/Surgical/OB history, and appropriate screenings. Based on history, this is a/an pregnancy uncomplicated .   Patient Active Problem List   Diagnosis Date Noted   Indication for care in labor or delivery 04/20/2021   Abdominal complaints 04/09/2021   Irregular uterine contractions 04/09/2021   Labor and delivery indication for care or intervention 04/06/2021   Headache in pregnancy 04/06/2021   Supervision of other normal pregnancy, antepartum 01/28/2021   Anemia of pregnancy in third trimester 04/27/2016    Concerns addressed today  Delivery Plans:  Plans to deliver at [facility]  Anatomy US Explained first scheduled [place] will be around 19 weeks.  Labs Discussed Maternity 21 genetic screening with patient. Patient is interested  genetic testing to be drawn at new OB visit. Discussed possible labs to be drawn at new OB appointment.  COVID Vaccine Patient has not had COVID vaccine.   Social Determinants of Health Food Insecurity: denies food insecurity WIC Referral: Patient is not interested in referral to [facility].  Transportation: Patient denies transportation needs. Childcare: Discussed no children allowed at  ultrasound appointments.    Clinical Staff Provider  Office Location  [facility] Dating  Not found.  Language  English Anatomy US    Flu Vaccine  2024 Genetic Screen  NIPS:   TDaP vaccine   offer Hgb A1C or  GTT Early : Third trimester :   Covid Has not    LAB RESULTS   Rhogam     Blood Type     Feeding Plan breast Antibody    Contraception tubal Rubella    Circumcision yes RPR     Pediatrician  [facility]  HBsAg     Support Person F[facility] and mom HIV    Prenatal Classes no Varicella     GBS  (For PCN allergy, check sensitivities)   BTL Consent needs Hep C     VBAC Consent  Pap Diagnosis  Date Value Ref Range Status  10/12/2020   Final   - Negative for intraepithelial lesion or malignancy (NILM)      Hgb Electro      CF      SMA           First visit review I reviewed new OB appt with pt. I explained she will have ob bloodwork and pap smear/pelvic exam if indicated. Explained pt will be seen by DR.Dove at first visit; encounter routed to appropriate provider.   Landis Gandy, [place] 10/11/2022  10:45 AM

## 2022-10-18 ENCOUNTER — Encounter: Payer: Self-pay | Admitting: Obstetrics and Gynecology

## 2022-10-21 ENCOUNTER — Ambulatory Visit
Admission: RE | Admit: 2022-10-21 | Discharge: 2022-10-21 | Disposition: A | Discharge status: Home / Self Care | Payer: Medicaid Other | Source: Ambulatory Visit | Attending: Obstetrics & Gynecology | Admitting: Obstetrics & Gynecology

## 2022-10-21 ENCOUNTER — Other Ambulatory Visit: Payer: Self-pay | Admitting: Obstetrics & Gynecology

## 2022-10-21 DIAGNOSIS — Z113 Encounter for screening for infections with a predominantly sexual mode of transmission: Secondary | ICD-10-CM

## 2022-10-21 DIAGNOSIS — Z3687 Encounter for antenatal screening for uncertain dates: Secondary | ICD-10-CM

## 2022-10-21 DIAGNOSIS — Z3480 Encounter for supervision of other normal pregnancy, unspecified trimester: Secondary | ICD-10-CM

## 2022-10-31 ENCOUNTER — Other Ambulatory Visit (HOSPITAL_COMMUNITY)
Admission: RE | Admit: 2022-10-31 | Discharge: 2022-10-31 | Disposition: A | Discharge status: Home / Self Care | Payer: Medicaid Other | Source: Ambulatory Visit | Attending: Obstetrics & Gynecology | Admitting: Obstetrics & Gynecology

## 2022-10-31 ENCOUNTER — Other Ambulatory Visit: Payer: Medicaid Other

## 2022-10-31 DIAGNOSIS — Z3480 Encounter for supervision of other normal pregnancy, unspecified trimester: Secondary | ICD-10-CM | POA: Diagnosis present

## 2022-10-31 DIAGNOSIS — Z113 Encounter for screening for infections with a predominantly sexual mode of transmission: Secondary | ICD-10-CM | POA: Diagnosis present

## 2022-11-01 LAB — CBC/D/PLT+RPR+RH+ABO+RUBIGG...
Antibody Screen: NEGATIVE
Basophils Absolute: 0.1 10*3/uL (ref 0.0–0.2)
Basos: 1 %
EOS (ABSOLUTE): 0.1 10*3/uL (ref 0.0–0.4)
Eos: 1 %
HCV Ab: NONREACTIVE
HIV Screen 4th Generation wRfx: NONREACTIVE
Hematocrit: 36.3 % (ref 34.0–46.6)
Hemoglobin: 11.7 g/dL (ref 11.1–15.9)
Hepatitis B Surface Ag: NEGATIVE
Immature Grans (Abs): 0.1 10*3/uL (ref 0.0–0.1)
Immature Granulocytes: 1 %
Lymphocytes Absolute: 1.9 10*3/uL (ref 0.7–3.1)
Lymphs: 26 %
MCH: 27.8 pg (ref 26.6–33.0)
MCHC: 32.2 g/dL (ref 31.5–35.7)
MCV: 86 fL (ref 79–97)
Monocytes Absolute: 0.4 10*3/uL (ref 0.1–0.9)
Monocytes: 6 %
Neutrophils Absolute: 4.7 10*3/uL (ref 1.4–7.0)
Neutrophils: 65 %
Platelets: 272 10*3/uL (ref 150–450)
RBC: 4.21 x10E6/uL (ref 3.77–5.28)
RDW: 13.2 % (ref 11.7–15.4)
RPR Ser Ql: NONREACTIVE
Rh Factor: POSITIVE
Rubella Antibodies, IGG: 3.43 index (ref >0.99)
Varicella zoster IgG: 393 index (ref >165)
WBC: 7.1 10*3/uL (ref 3.4–10.8)

## 2022-11-01 LAB — URINALYSIS, ROUTINE W REFLEX MICROSCOPIC
Bilirubin, UA: NEGATIVE
Glucose, UA: NEGATIVE
Ketones, UA: NEGATIVE
Nitrite, UA: NEGATIVE
Protein,UA: NEGATIVE
RBC, UA: NEGATIVE
Specific Gravity, UA: 1.009 (ref 1.005–1.030)
Urobilinogen, Ur: 1 mg/dL (ref 0.2–1.0)
pH, UA: 7.5 (ref 5.0–7.5)

## 2022-11-01 LAB — URINE CYTOLOGY ANCILLARY ONLY
Chlamydia: NEGATIVE
Comment: NEGATIVE
Comment: NORMAL
Neisseria Gonorrhea: NEGATIVE

## 2022-11-01 LAB — MICROSCOPIC EXAMINATION
Bacteria, UA: NONE SEEN
Casts: NONE SEEN /lpf
RBC, Urine: NONE SEEN /hpf (ref 0–2)

## 2022-11-01 LAB — HCV INTERPRETATION

## 2022-11-02 LAB — MONITOR DRUG PROFILE 14(MW)
Amphetamine Scrn, Ur: NEGATIVE ng/mL
BARBITURATE SCREEN URINE: NEGATIVE ng/mL
BENZODIAZEPINE SCREEN, URINE: NEGATIVE ng/mL
Buprenorphine, Urine: NEGATIVE ng/mL
CANNABINOIDS UR QL SCN: NEGATIVE ng/mL
Cocaine (Metab) Scrn, Ur: NEGATIVE ng/mL
Creatinine(Crt), U: 33 mg/dL (ref 20.0–300.0)
Fentanyl, Urine: NEGATIVE pg/mL
Meperidine Screen, Urine: NEGATIVE ng/mL
Methadone Screen, Urine: NEGATIVE ng/mL
OXYCODONE+OXYMORPHONE UR QL SCN: NEGATIVE ng/mL
Opiate Scrn, Ur: NEGATIVE ng/mL
Ph of Urine: 6.8 (ref 4.5–8.9)
Phencyclidine Qn, Ur: NEGATIVE ng/mL
Propoxyphene Scrn, Ur: NEGATIVE ng/mL
SPECIFIC GRAVITY: 1.008
Tramadol Screen, Urine: NEGATIVE ng/mL

## 2022-11-02 LAB — CULTURE, OB URINE

## 2022-11-02 LAB — URINE CULTURE, OB REFLEX

## 2022-11-02 LAB — NICOTINE SCREEN, URINE: Cotinine Ql Scrn, Ur: NEGATIVE ng/mL

## 2022-11-04 LAB — MATERNIT 21 PLUS CORE, BLOOD
Fetal Fraction: 10
Result (T21): NEGATIVE
Trisomy 13 (Patau syndrome): NEGATIVE
Trisomy 18 (Edwards syndrome): NEGATIVE
Trisomy 21 (Down syndrome): NEGATIVE

## 2022-11-14 ENCOUNTER — Ambulatory Visit (INDEPENDENT_AMBULATORY_CARE_PROVIDER_SITE_OTHER): Payer: Medicaid Other | Admitting: Obstetrics & Gynecology

## 2022-11-14 ENCOUNTER — Telehealth: Payer: Self-pay

## 2022-11-14 VITALS — BP 112/75 | HR 96 | Wt 144.0 lb

## 2022-11-14 DIAGNOSIS — O209 Hemorrhage in early pregnancy, unspecified: Secondary | ICD-10-CM

## 2022-11-14 DIAGNOSIS — Z3A13 13 weeks gestation of pregnancy: Secondary | ICD-10-CM

## 2022-11-14 NOTE — Progress Notes (Signed)
This is a work in visit for a G5P3 at 13.3 weeks c/w 10 week ultrasound. She started with bright red bleeding this morning "like a period". It stopped and did not restart. She reports last sex was about 4 days ago.   Well nourished, well hydrated White female, no apparent distress She is ambulating and conversing normally. Spec exam reveals a closed cervix, no active bleeding. A small amount of old brown mucous. I swabbed the cervix with a cyto broom and it was very friable, causing bright red bleeding from the ectocervix.  Bedside ultrasound reveals a mobile single fetus with FHR of 130.  I have reassured her and rec'd pelvic rest. She has a NOB visit scheduled next week.

## 2022-11-14 NOTE — Telephone Encounter (Signed)
Marcia Hodges called triage stating when she woke up there was blood in the toilet and when she wiped. I asked her was she still bleeding and she said only when she wipes. I advised her if the bleeding picks up and she's soaking a pad within an hour to please head to ED to be checked out. She's on the schedule today to see Dr. Marice Potter at 2:15 PM

## 2022-11-18 ENCOUNTER — Ambulatory Visit (INDEPENDENT_AMBULATORY_CARE_PROVIDER_SITE_OTHER): Payer: Medicaid Other | Admitting: Obstetrics & Gynecology

## 2022-11-18 ENCOUNTER — Encounter: Payer: Self-pay | Admitting: Obstetrics & Gynecology

## 2022-11-18 VITALS — BP 113/80 | HR 87 | Wt 143.0 lb

## 2022-11-18 DIAGNOSIS — Z3A14 14 weeks gestation of pregnancy: Secondary | ICD-10-CM

## 2022-11-18 DIAGNOSIS — Z3482 Encounter for supervision of other normal pregnancy, second trimester: Secondary | ICD-10-CM | POA: Diagnosis not present

## 2022-11-18 DIAGNOSIS — Z3480 Encounter for supervision of other normal pregnancy, unspecified trimester: Secondary | ICD-10-CM

## 2022-11-18 DIAGNOSIS — Z3687 Encounter for antenatal screening for uncertain dates: Secondary | ICD-10-CM

## 2022-11-18 LAB — POCT URINALYSIS DIPSTICK OB
Bilirubin, UA: NEGATIVE
Blood, UA: NEGATIVE
Glucose, UA: NEGATIVE
Ketones, UA: NEGATIVE
Leukocytes, UA: NEGATIVE
Nitrite, UA: NEGATIVE
Spec Grav, UA: 1.01 (ref 1.010–1.025)
Urobilinogen, UA: 1 E.U./dL
pH, UA: 6.5 (ref 5.0–8.0)

## 2022-11-18 NOTE — Progress Notes (Signed)
  Subjective:    Marcia Hodges is a S2G3151 at [redacted]w[redacted]d being seen today for her first obstetrical visit.  Her obstetrical history is significant for  none . Patient does intend to breast feed. Pregnancy history fully reviewed. The light spotting that brought her to the office last week has stopped. She denies any problems today.   Vitals:   11/18/22 1445  BP: 113/80  Pulse: 87  Weight: 143 lb (64.9 kg)    HISTORY: OB History  Gravida Para Term Preterm AB Living  5 3 3  0 0 3  SAB IAB Ectopic Multiple Live Births  0 0 0 0 3    # Outcome Date GA Lbr Len/2nd Weight Sex Delivery Anes PTL Lv  5 Current           4 Term 04/20/21 [redacted]w[redacted]d 06:05 / 00:02 6 lb 1.7 oz (2.77 kg) M Vag-Spont None  LIV  3 Term 11/26/18 [redacted]w[redacted]d / 00:09 7 lb 5.8 oz (3.34 kg) M Vag-Spont EPI  LIV  2 Term 04/25/16 [redacted]w[redacted]d  7 lb 1.2 oz (3.21 kg) M Vag-Spont EPI  LIV  1 Gravida            Past Medical History:  Diagnosis Date   Abnormal breast exam    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 Heart- rrr without murmur Lungs- CTAB Abd- benign    Assessment:    Pregnancy: [id number] Patient Active Problem List   Diagnosis Date Noted   Supervision of other normal pregnancy, antepartum 01/28/2021        Plan:     Initial labs drawn previously and noted to be normal including Mat 21. I ordered anatomy scan at 19 weeks Prenatal vitamins.   Marcia Hodges 11/18/2022

## 2022-11-19 ENCOUNTER — Other Ambulatory Visit: Payer: Self-pay

## 2022-11-19 ENCOUNTER — Encounter: Payer: Self-pay | Admitting: Obstetrics and Gynecology

## 2022-11-19 DIAGNOSIS — Z3689 Encounter for other specified antenatal screening: Secondary | ICD-10-CM

## 2022-12-02 ENCOUNTER — Encounter: Payer: Self-pay | Admitting: Obstetrics and Gynecology

## 2022-12-02 MED ORDER — BUTALBITAL-APAP-CAFFEINE 50-325-40 MG PO CAPS: 1.0000 | ORAL_CAPSULE | Freq: Four times a day (QID) | ORAL | 3 refills | Status: DC | PRN

## 2022-12-08 ENCOUNTER — Other Ambulatory Visit: Payer: Self-pay

## 2022-12-08 ENCOUNTER — Emergency Department
Admission: EM | Admit: 2022-12-08 | Discharge: 2022-12-08 | Disposition: A | Discharge status: Home / Self Care | Payer: Medicaid Other | Attending: Emergency Medicine | Admitting: Emergency Medicine

## 2022-12-08 ENCOUNTER — Emergency Department: Payer: Medicaid Other

## 2022-12-08 DIAGNOSIS — O209 Hemorrhage in early pregnancy, unspecified: Secondary | ICD-10-CM | POA: Insufficient documentation

## 2022-12-08 DIAGNOSIS — O4690 Antepartum hemorrhage, unspecified, unspecified trimester: Secondary | ICD-10-CM

## 2022-12-08 DIAGNOSIS — Z3A17 17 weeks gestation of pregnancy: Secondary | ICD-10-CM | POA: Diagnosis not present

## 2022-12-08 LAB — TYPE AND SCREEN
ABO/RH(D): A POS
Antibody Screen: NEGATIVE

## 2022-12-08 LAB — URINALYSIS, ROUTINE W REFLEX MICROSCOPIC
Bilirubin Urine: NEGATIVE
Glucose, UA: NEGATIVE mg/dL
Hgb urine dipstick: NEGATIVE
Ketones, ur: NEGATIVE mg/dL
Nitrite: NEGATIVE
Protein, ur: NEGATIVE mg/dL
Specific Gravity, Urine: 1.01 (ref 1.005–1.030)
pH: 7 (ref 5.0–8.0)

## 2022-12-08 LAB — CBC
HCT: 36.1 % (ref 36.0–46.0)
Hemoglobin: 11.8 g/dL — ABNORMAL LOW (ref 12.0–15.0)
MCH: 28.3 pg (ref 26.0–34.0)
MCHC: 32.7 g/dL (ref 30.0–36.0)
MCV: 86.6 fL (ref 80.0–100.0)
Platelets: 265 10*3/uL (ref 150–400)
RBC: 4.17 MIL/uL (ref 3.87–5.11)
RDW: 13.2 % (ref 11.5–15.5)
WBC: 9.3 10*3/uL (ref 4.0–10.5)
nRBC: 0 % (ref 0.0–0.2)

## 2022-12-08 LAB — POC URINE PREG, ED: Preg Test, Ur: POSITIVE — AB

## 2022-12-08 MED ORDER — METOCLOPRAMIDE HCL 5 MG/ML IJ SOLN
10.0000 mg | Freq: Once | INTRAMUSCULAR | Status: AC
  Administered 2022-12-08: 10 mg via INTRAVENOUS
  Filled 2022-12-08: qty 2

## 2022-12-08 MED ORDER — SODIUM CHLORIDE 0.9 % IV BOLUS
500.0000 mL | Freq: Once | INTRAVENOUS | Status: AC
  Administered 2022-12-08: 500 mL via INTRAVENOUS

## 2022-12-08 NOTE — Discharge Instructions (Signed)
You should do pelvic rest and not have any intercourse until you follow-up with your OB/GYN.  Follow-up as scheduled.  Return to the ER for new, worsening, or persistent severe bleeding, pain, or any other new or worsening symptoms that concern you.

## 2022-12-08 NOTE — ED Provider Notes (Signed)
[facility] Provider Note    Event Date/Time   First MD Initiated Contact with Patient 12/08/22 1012     (approximate)   History   Abdominal Pain and Vaginal Bleeding ([redacted] weeks pregnant)   HPI  [facility] Bartoli is a 27 y.o. female G5P003 with no active medical problems who presents with suprapubic abdominal pain over the last day associated with vaginal bleeding last night which has now subsided.  The patient denies any dysuria or frequency.  She also reports a headache which is left-sided and throbbing.  It is associated with nausea and is similar to prior migraines.  She states that normally it is relieved with Tylenol but she took Tylenol this morning and it did not get better.  I reviewed the past medical records.  The patient was seen by Dr. Marice Potter from OB/GYN for her first obstetrical visit on 4/15.  She had no acute issues at that time.   Physical Exam   Triage Vital Signs: ED Triage Vitals [12/08/22 0838]  Enc Vitals Group     BP 120/82     Pulse Rate 88     Resp 16     Temp 97.7 F (36.5 C)     Temp Source Oral     SpO2 100 %     Weight 145 lb (65.8 kg)     Height 5\' 2"  (1.575 m)     Head Circumference      Peak Flow      Pain Score 4     Pain Loc      Pain Edu?      Excl. in GC?     Most recent vital signs: Vitals:   12/08/22 0838 12/08/22 1308  BP: 120/82 106/68  Pulse: 88 88  Resp: 16 16  Temp: 97.7 F (36.5 C)   SpO2: 100% 99%     General: Alert, well-appearing. CV:  Good peripheral perfusion.  Resp:  Normal effort.  Abd:  Soft and nontender.  No distention.  Other:  EOMI.  PERRLA.  No photophobia.  No facial droop.  Motor intact in all extremities.  Normal coordination.  Normal external genitalia.  No blood in the vaginal vault.  Cervical os closed.   ED Results / Procedures / Treatments   Labs (all labs ordered are listed, but only abnormal results are displayed) Labs Reviewed  CBC - Abnormal; Notable for the  following components:      Result Value   Hemoglobin 11.8 (*)    All other components within normal limits  URINALYSIS, ROUTINE W REFLEX MICROSCOPIC - Abnormal; Notable for the following components:   Color, Urine YELLOW (*)    APPearance HAZY (*)    Leukocytes,Ua TRACE (*)    Bacteria, UA RARE (*)    All other components within normal limits  POC URINE PREG, ED - Abnormal; Notable for the following components:   Preg Test, Ur POSITIVE (*)    All other components within normal limits  TYPE AND SCREEN     EKG     RADIOLOGY  US OB:   IMPRESSION:  1. Single live intrauterine pregnancy with a measured gestational  age of [redacted] weeks and 1 day.  2. Echogenic material suggesting air and some fluid noted along the  endocervical canal. No other abnormality.    This exam is performed on an emergent basis and does not  comprehensively evaluate fetal size, dating, or anatomy; follow-up  complete OB US should be  considered if further fetal assessment is  warranted.    PROCEDURES:  Critical Care performed: No  Procedures   MEDICATIONS ORDERED IN ED: Medications  sodium chloride 0.9 % bolus 500 mL (0 mLs Intravenous Stopped 12/08/22 1131)  metoCLOPramide (REGLAN) injection 10 mg (10 mg Intravenous Given 12/08/22 1043)     IMPRESSION / MDM / ASSESSMENT AND PLAN / ED COURSE  I reviewed the triage vital signs and the nursing notes.  27 year old female with PMH as noted above presents with vaginal bleeding and suprapubic abdominal pain as well as a headache which is similar to headache she has had previously.  Differential diagnosis includes, but is not limited to, threatened miscarriage, subchorionic hemorrhage,, less likely placental abruption or other acute complication.  Headache is most consistent with migraine although differential also includes tension headache or other benign etiology.  We will give fluids, Reglan, obtain an ultrasound, urinalysis, and reassess.  Patient's  presentation is most consistent with acute complicated illness / injury requiring diagnostic workup.  ----------------------------------------- 12:49 PM on 12/08/2022 -----------------------------------------  Urinalysis shows rare bacteria but no other significant findings.  CBC shows no leukocytosis or anemia.  The patient is Rh+.  Ultrasound shows an IUP with normal FHR.  There is some echogenic material in the endocervical canal but no other acute findings.  Pelvic exam showed no blood in the vault or other acute findings.  I consulted and discussed case with CNM Eunice Blase from OB/GYN who did not recommend any further workup or intervention and states that the patient is appropriate for close follow-up.  She did recommend pelvic rest and abstaining from intercourse.  The patient agrees with this plan.  She is stable for discharge home.  I gave strict return precautions and she expressed understanding.  FINAL CLINICAL IMPRESSION(S) / ED DIAGNOSES   Final diagnoses:  Vaginal bleeding in pregnancy     Rx / DC Orders   ED Discharge Orders     None        Note:  This document was prepared using Dragon voice recognition software and may include unintentional dictation errors.    Dionne Bucy, MD 12/08/22 [id number]

## 2022-12-08 NOTE — ED Triage Notes (Signed)
Pt reports being [redacted] weeks pregnant with cramping and light bright red vaginal bleeding that started yesterday.

## 2022-12-16 ENCOUNTER — Encounter: Payer: Medicaid Other | Admitting: Obstetrics

## 2022-12-17 ENCOUNTER — Ambulatory Visit (INDEPENDENT_AMBULATORY_CARE_PROVIDER_SITE_OTHER): Payer: Medicaid Other | Admitting: Certified Nurse Midwife

## 2022-12-17 ENCOUNTER — Encounter: Payer: Self-pay | Admitting: Certified Nurse Midwife

## 2022-12-17 VITALS — BP 106/68 | HR 83 | Wt 147.0 lb

## 2022-12-17 DIAGNOSIS — F32A Depression, unspecified: Secondary | ICD-10-CM

## 2022-12-17 DIAGNOSIS — Z3A18 18 weeks gestation of pregnancy: Secondary | ICD-10-CM

## 2022-12-17 DIAGNOSIS — O99342 Other mental disorders complicating pregnancy, second trimester: Secondary | ICD-10-CM

## 2022-12-17 DIAGNOSIS — Z3480 Encounter for supervision of other normal pregnancy, unspecified trimester: Secondary | ICD-10-CM

## 2022-12-17 DIAGNOSIS — F419 Anxiety disorder, unspecified: Secondary | ICD-10-CM

## 2022-12-17 LAB — POCT URINALYSIS DIPSTICK OB
Bilirubin, UA: NEGATIVE
Blood, UA: NEGATIVE
Glucose, UA: NEGATIVE
Ketones, UA: NEGATIVE
Leukocytes, UA: NEGATIVE
Nitrite, UA: NEGATIVE
POC,PROTEIN,UA: NEGATIVE
Spec Grav, UA: 1.015 (ref 1.010–1.025)
Urobilinogen, UA: 1 E.U./dL
pH, UA: 6 (ref 5.0–8.0)

## 2022-12-17 MED ORDER — SERTRALINE HCL 50 MG PO TABS: 50.0000 mg | ORAL_TABLET | Freq: Every day | ORAL | 1 refills | Status: DC

## 2022-12-17 NOTE — Progress Notes (Signed)
   PRENATAL VISIT NOTE  Subjective:  Marcia Hodges is a 27 y.o. G5P3003 at [redacted]w[redacted]d being seen today for ongoing prenatal care.  She is currently monitored for the following issues for this low-risk pregnancy and has Supervision of other normal pregnancy, antepartum on their problem list.  Patient reports  increased anxiety & depressed mood with insomnia, decreased appetite and feelings that her family would be better off without her. She denies SI/HI/thoughts of self harm and does not have access to firearms. She is interested in restarting sertraline. She has taken it in the past for postpartum depression with good results and without significant side effects .  Contractions: Not present. Vag. Bleeding: None.  Movement: Absent. Denies leaking of fluid.   The following portions of the patient's history were reviewed and updated as appropriate: allergies, current medications, past family history, past medical history, past social history, past surgical history and problem list.   Objective:   Vitals:   12/17/22 0942  BP: 106/68  Pulse: 83  Weight: 147 lb (66.7 kg)  Total weight gain: 2 lb (0.907 kg)  Fetal Status: Fetal Heart Rate (bpm): 145   Movement: Absent     General:  Alert, oriented and cooperative. Patient is in no acute distress.  Skin: Skin is warm and dry. No rash noted.   Cardiovascular: Normal heart rate noted  Respiratory: Normal respiratory effort, no problems with respiration noted  Abdomen: Soft, gravid, appropriate for gestational age.  Pain/Pressure: Absent     Pelvic: Cervical exam deferred        Extremities: Normal range of motion.     Mental Status: Normal mood and affect. Normal behavior. Normal judgment and thought content.   Assessment and Plan:  Pregnancy: G5P3003 at [redacted]w[redacted]d 1. Supervision of other normal pregnancy, antepartum 2. Anxiety & depression. Start zoloft at 25mg  daily for six days then increase to 50mg . Declines counseling resources at this  time. [employer] information sent via portal. Emergency resources for thoughts of self harm reviewed.  - POC Urinalysis Dipstick OB  Preterm labor symptoms and general obstetric precautions including but not limited to vaginal bleeding, contractions, leaking of fluid and fetal movement were reviewed in detail with the patient. Please refer to After Visit Summary for other counseling recommendations.   Return in 2 weeks (on 12/31/2022) for video visit for mood check.  Future Appointments  Date Time Provider Department Center  12/25/2022 11:00 AM [facility]  01/14/2023 10:15 AM [name], CNM [facility] None    [name], CNM

## 2022-12-17 NOTE — Patient Instructions (Addendum)
[employer]: DentalFoam.cz   Second Trimester of Pregnancy  The second trimester of pregnancy is from week 13 through week 27. This is months 4 through 6 of pregnancy. The second trimester is often a time when you feel your best. Your body has adjusted to being pregnant, and you begin to feel better physically. During the second trimester: Morning sickness has lessened or stopped completely. You may have more energy. You may have an increase in appetite. The second trimester is also a time when the unborn baby (fetus) is growing rapidly. At the end of the sixth month, the fetus may be up to 12 inches long and weigh about 1 pounds. You will likely begin to feel the baby move (quickening) between 16 and 20 weeks of pregnancy. Body changes during your second trimester Your body continues to go through many changes during your second trimester. The changes vary and generally return to normal after the baby is born. Physical changes Your weight will continue to increase. You will notice your lower abdomen bulging out. You may begin to get stretch marks on your hips, abdomen, and breasts. Your breasts will continue to grow and to become tender. Dark spots or blotches (chloasma or mask of pregnancy) may develop on your face. A dark line from your belly button to the pubic area (linea nigra) may appear. You may have changes in your hair. These can include thickening of your hair, rapid growth, and changes in texture. Some people also have hair loss during or after pregnancy, or hair that feels dry or thin. Health changes You may develop headaches. You may have heartburn. You may develop constipation. You may develop hemorrhoids or swollen, bulging veins (varicose veins). Your gums may bleed and may be sensitive to brushing and flossing. You may urinate more often because the fetus is pressing on your bladder. You may have back pain. This is caused by: Weight  gain. Pregnancy hormones that are relaxing the joints in your pelvis. A shift in weight and the muscles that support your balance. Follow these instructions at home: Medicines Follow your health care provider's instructions regarding medicine use. Specific medicines may be either safe or unsafe to take during pregnancy. Do not take any medicines unless approved by your health care provider. Take a prenatal vitamin that contains at least 600 micrograms (mcg) of folic acid. Eating and drinking Eat a healthy diet that includes fresh fruits and vegetables, whole grains, good sources of protein such as meat, eggs, or tofu, and low-fat dairy products. Avoid raw meat and unpasteurized juice, milk, and cheese. These carry germs that can harm you and your baby. You may need to take these actions to prevent or treat constipation: Drink enough fluid to keep your urine pale yellow. Eat foods that are high in fiber, such as beans, whole grains, and fresh fruits and vegetables. Limit foods that are high in fat and processed sugars, such as fried or sweet foods. Activity Exercise only as directed by your health care provider. Most people can continue their usual exercise routine during pregnancy. Try to exercise for 30 minutes at least 5 days a week. Stop exercising if you develop contractions in your uterus. Stop exercising if you develop pain or cramping in the lower abdomen or lower back. Avoid exercising if it is very hot or humid or if you are at a high altitude. Avoid heavy lifting. If you choose to, you may have sex unless your health care provider tells you not to. Relieving  pain and discomfort Wear a supportive bra to prevent discomfort from breast tenderness. Take warm sitz baths to soothe any pain or discomfort caused by hemorrhoids. Use hemorrhoid cream if your health care provider approves. Rest with your legs raised (elevated) if you have leg cramps or low back pain. If you develop varicose  veins: Wear support hose as told by your health care provider. Elevate your feet for 15 minutes, 3-4 times a day. Limit salt in your diet. Safety Wear your seat belt at all times when driving or riding in a car. Talk with your health care provider if someone is verbally or physically abusive to you. Lifestyle Do not use hot tubs, steam rooms, or saunas. Do not douche. Do not use tampons or scented sanitary pads. Avoid cat litter boxes and soil used by cats. These carry germs that can cause birth defects in the baby and possibly loss of the fetus by miscarriage or stillbirth. Do not use herbal remedies, alcohol, illegal drugs, or medicines that are not approved by your health care provider. Chemicals in these products can harm your baby. Do not use any products that contain nicotine or tobacco, such as cigarettes, e-cigarettes, and chewing tobacco. If you need help quitting, ask your health care provider. General instructions During a routine prenatal visit, your health care provider will do a physical exam and other tests. He or she will also discuss your overall health. Keep all follow-up visits. This is important. Ask your health care provider for a referral to a local prenatal education class. Ask for help if you have counseling or nutritional needs during pregnancy. Your health care provider can offer advice or refer you to specialists for help with various needs. Where to find more information American Pregnancy Association: americanpregnancy.org [employer] of [employer]stetricians and Gynecologists: https://www.todd-brady.net/ Office on [employer] Health: MightyReward.co.nz Contact a health care provider if you have: A headache that does not go away when you take medicine. Vision changes or you see spots in front of your eyes. Mild pelvic cramps, pelvic pressure, or nagging pain in the abdominal area. Persistent nausea, vomiting, or diarrhea. A bad-smelling vaginal  discharge or foul-smelling urine. Pain when you urinate. Sudden or extreme swelling of your face, hands, ankles, feet, or legs. A fever. Get help right away if you: Have fluid leaking from your vagina. Have spotting or bleeding from your vagina. Have severe abdominal cramping or pain. Have difficulty breathing. Have chest pain. Have fainting spells. Have not felt your baby move for the time period told by your health care provider. Have new or increased pain, swelling, or redness in an arm or leg. Summary The second trimester of pregnancy is from week 13 through week 27 (months 4 through 6). Do not use herbal remedies, alcohol, illegal drugs, or medicines that are not approved by your health care provider. Chemicals in these products can harm your baby. Exercise only as directed by your health care provider. Most people can continue their usual exercise routine during pregnancy. Keep all follow-up visits. This is important. This information is not intended to replace advice given to you by your health care provider. Make sure you discuss any questions you have with your health care provider. Document Revised: 12/29/2019 Document Reviewed: 11/04/2019 Elsevier Patient Education  2023 [employer].

## 2022-12-25 ENCOUNTER — Ambulatory Visit
Admission: RE | Admit: 2022-12-25 | Discharge: 2022-12-25 | Disposition: A | Discharge status: Home / Self Care | Payer: Medicaid Other | Source: Ambulatory Visit | Attending: Obstetrics & Gynecology | Admitting: Obstetrics & Gynecology

## 2022-12-25 DIAGNOSIS — Z3689 Encounter for other specified antenatal screening: Secondary | ICD-10-CM | POA: Insufficient documentation

## 2023-01-14 ENCOUNTER — Ambulatory Visit (INDEPENDENT_AMBULATORY_CARE_PROVIDER_SITE_OTHER): Payer: Medicaid Other | Admitting: Certified Nurse Midwife

## 2023-01-14 VITALS — BP 110/70 | HR 85 | Wt 150.0 lb

## 2023-01-14 DIAGNOSIS — O99342 Other mental disorders complicating pregnancy, second trimester: Secondary | ICD-10-CM

## 2023-01-14 DIAGNOSIS — Z3A22 22 weeks gestation of pregnancy: Secondary | ICD-10-CM

## 2023-01-14 DIAGNOSIS — Z131 Encounter for screening for diabetes mellitus: Secondary | ICD-10-CM

## 2023-01-14 DIAGNOSIS — Z3480 Encounter for supervision of other normal pregnancy, unspecified trimester: Secondary | ICD-10-CM

## 2023-01-14 DIAGNOSIS — F32A Depression, unspecified: Secondary | ICD-10-CM

## 2023-01-14 DIAGNOSIS — Z369 Encounter for antenatal screening, unspecified: Secondary | ICD-10-CM

## 2023-01-14 LAB — POCT URINALYSIS DIPSTICK OB
Bilirubin, UA: NEGATIVE
Blood, UA: NEGATIVE
Glucose, UA: NEGATIVE
Ketones, UA: NEGATIVE
Leukocytes, UA: NEGATIVE
Nitrite, UA: NEGATIVE
POC,PROTEIN,UA: NEGATIVE
Spec Grav, UA: 1.01 (ref 1.010–1.025)
Urobilinogen, UA: 1 E.U./dL
pH, UA: 6 (ref 5.0–8.0)

## 2023-01-14 MED ORDER — SERTRALINE HCL 25 MG PO TABS: 25.0000 mg | ORAL_TABLET | Freq: Every day | ORAL | 1 refills | Status: DC

## 2023-01-14 NOTE — Addendum Note (Signed)
Addended by: Cornelius Moras D on: 01/14/2023 10:29 AM   Modules accepted: Orders

## 2023-01-14 NOTE — Patient Instructions (Signed)

## 2023-01-14 NOTE — Progress Notes (Signed)
   PRENATAL VISIT NOTE  Subjective:  Marcia Hodges is a 27 y.o. G5P3003 at 100w1d being seen today for ongoing prenatal care.  She is currently monitored for the following issues for this low-risk pregnancy and has Supervision of other normal pregnancy, antepartum and Anxiety and depression on their problem list. She is taking Zoloft 25mg , has not increased to 50mg  as symptoms have improved on this dose.  Patient reports  mood significantly improved. Sleeping better, waking occasionally but able to fall back asleep easily. Appetite improved as well. No longer having thoughts of family being better of without her .  Contractions: Not present. Vag. Bleeding: None.  Movement: Present. Denies leaking of fluid.   The following portions of the patient's history were reviewed and updated as appropriate: allergies, current medications, past family history, past medical history, past social history, past surgical history and problem list.   Objective:   Vitals:   01/14/23 1005  BP: 110/70  Pulse: 85  Weight: 150 lb (68 kg)   Total weight gain: 5 lb (2.268 kg) Fetal Status: Fetal Heart Rate (bpm): 145 Fundal Height: 22 cm Movement: Present      General:  Alert, oriented and cooperative. Patient is in no acute distress.  Skin: Skin is warm and dry. No rash noted.   Cardiovascular: Normal heart rate noted  Respiratory: Normal respiratory effort, no problems with respiration noted  Abdomen: Soft, gravid, appropriate for gestational age.  Pain/Pressure: Absent     Pelvic: Cervical exam deferred        Extremities: Normal range of motion.  Edema: None  Mental Status: Normal mood and affect. Normal behavior. Normal judgment and thought content.   Assessment and Plan:  Pregnancy: G5P3003 at [redacted]w[redacted]d 1. Supervision of other normal pregnancy, antepartum  2. [redacted] weeks gestation of pregnancy  3. Depression affecting pregnancy - sertraline (ZOLOFT) 25 MG tablet; Take 1 tablet (25 mg total) by mouth  daily.  Dispense: 90 tablet; Refill: 1 -Continue Zoloft at 25mg  daily.  28w labs at next visit.  Preterm labor symptoms and general obstetric precautions including but not limited to vaginal bleeding, contractions, leaking of fluid and fetal movement were reviewed in detail with the patient. Please refer to After Visit Summary for other counseling recommendations.   Return in 4 weeks (on 02/11/2023) for ROB with GDM screening.  Future Appointments  Date Time Provider Department Center  02/11/2023 10:55 AM Doreene Burke, CNM [facility] None    [name], CNM

## 2023-01-20 ENCOUNTER — Encounter: Payer: Self-pay | Admitting: Certified Nurse Midwife

## 2023-01-24 ENCOUNTER — Telehealth: Payer: Self-pay

## 2023-01-24 ENCOUNTER — Other Ambulatory Visit: Payer: Self-pay

## 2023-01-24 ENCOUNTER — Observation Stay: Payer: Medicaid Other

## 2023-01-24 ENCOUNTER — Encounter: Payer: Self-pay | Admitting: Obstetrics and Gynecology

## 2023-01-24 ENCOUNTER — Observation Stay
Admission: EM | Admit: 2023-01-24 | Discharge: 2023-01-24 | Disposition: A | Discharge status: Home / Self Care | Payer: Medicaid Other | Attending: Obstetrics | Admitting: Obstetrics

## 2023-01-24 DIAGNOSIS — O26892 Other specified pregnancy related conditions, second trimester: Secondary | ICD-10-CM | POA: Diagnosis present

## 2023-01-24 DIAGNOSIS — F419 Anxiety disorder, unspecified: Secondary | ICD-10-CM

## 2023-01-24 DIAGNOSIS — R109 Unspecified abdominal pain: Secondary | ICD-10-CM | POA: Insufficient documentation

## 2023-01-24 DIAGNOSIS — Z3A23 23 weeks gestation of pregnancy: Secondary | ICD-10-CM | POA: Diagnosis not present

## 2023-01-24 DIAGNOSIS — Z3490 Encounter for supervision of normal pregnancy, unspecified, unspecified trimester: Secondary | ICD-10-CM

## 2023-01-24 LAB — CBC
HCT: 32.3 % — ABNORMAL LOW (ref 36.0–46.0)
Hemoglobin: 10.6 g/dL — ABNORMAL LOW (ref 12.0–15.0)
MCH: 28.2 pg (ref 26.0–34.0)
MCHC: 32.8 g/dL (ref 30.0–36.0)
MCV: 85.9 fL (ref 80.0–100.0)
Platelets: 235 10*3/uL (ref 150–400)
RBC: 3.76 MIL/uL — ABNORMAL LOW (ref 3.87–5.11)
RDW: 13 % (ref 11.5–15.5)
WBC: 9.6 10*3/uL (ref 4.0–10.5)
nRBC: 0 % (ref 0.0–0.2)

## 2023-01-24 LAB — URINALYSIS, ROUTINE W REFLEX MICROSCOPIC
Bilirubin Urine: NEGATIVE
Glucose, UA: NEGATIVE mg/dL
Hgb urine dipstick: NEGATIVE
Ketones, ur: NEGATIVE mg/dL
Nitrite: NEGATIVE
Protein, ur: NEGATIVE mg/dL
Specific Gravity, Urine: 1.005 (ref 1.005–1.030)
pH: 8 (ref 5.0–8.0)

## 2023-01-24 LAB — WET PREP, GENITAL
Clue Cells Wet Prep HPF POC: NONE SEEN
Sperm: NONE SEEN
Trich, Wet Prep: NONE SEEN
WBC, Wet Prep HPF POC: >=10 — AB (ref <10)
Yeast Wet Prep HPF POC: NONE SEEN

## 2023-01-24 LAB — COMPREHENSIVE METABOLIC PANEL
ALT: 11 U/L (ref 0–44)
AST: 11 U/L — ABNORMAL LOW (ref 15–41)
Albumin: 2.7 g/dL — ABNORMAL LOW (ref 3.5–5.0)
Alkaline Phosphatase: 100 U/L (ref 38–126)
Anion gap: 6 (ref 5–15)
BUN: 6 mg/dL (ref 6–20)
CO2: 24 mmol/L (ref 22–32)
Calcium: 8.5 mg/dL — ABNORMAL LOW (ref 8.9–10.3)
Chloride: 106 mmol/L (ref 98–111)
Creatinine, Ser: 0.57 mg/dL (ref 0.44–1.00)
GFR, Estimated: >60 mL/min (ref >60)
Glucose, Bld: 89 mg/dL (ref 70–99)
Potassium: 2.9 mmol/L — ABNORMAL LOW (ref 3.5–5.1)
Sodium: 136 mmol/L (ref 135–145)
Total Bilirubin: 0.3 mg/dL (ref 0.3–1.2)
Total Protein: 6.6 g/dL (ref 6.5–8.1)

## 2023-01-24 LAB — LIPASE, BLOOD: Lipase: 28 U/L (ref 11–51)

## 2023-01-24 LAB — AMYLASE: Amylase: 50 U/L (ref 28–100)

## 2023-01-24 MED ORDER — OXYCODONE-ACETAMINOPHEN 5-325 MG PO TABS
1.0000 | ORAL_TABLET | Freq: Four times a day (QID) | ORAL | Status: DC | PRN
  Administered 2023-01-24: 1 via ORAL
  Filled 2023-01-24: qty 1

## 2023-01-24 MED ORDER — LACTATED RINGERS IV SOLN: INTRAVENOUS | Status: DC

## 2023-01-24 NOTE — Telephone Encounter (Signed)
Pt has been having left side belly pain since Wednesday morning, woke her up around 6:00 a.m. Describes pain as stabbing and burning, about a 5/6 from a scale 1-10 with 10 being the worse. She has been taking tylenol every 8-ish hours, she gets some relief but does not take pain away completely. Denies fevers, uti sx, pelvic pain, leakage of fluid/vag spotting/bleeding. Baby is moving, not as much as he use to but she feels him. Bowel movements are good, not constipated. Pt advised to go to ER, MMF is aware.

## 2023-01-24 NOTE — OB Triage Note (Signed)
Patient is a [id number] at [redacted]w[redacted]d who presents to nunit c/o left sided abdominal pain that is described as stabbing and burning that started on Wednesday morning and has not been relieved with Tylenol. Reports +FM, just a little bit less and denies LOF and vaginal bleeding. Also reports yellowish, watery discharge since the start of her pregnancy. Denies sexual intercourse in the past 24 hours. External monitors applied and assessing. Initial FHT 135. Vital signs WDL.

## 2023-01-24 NOTE — Final Progress Note (Signed)
Final Progress Note  Patient ID: Marcia Hodges MRN: [id number] DOB/AGE: [birth year] 27 y.o.  Admit date: 01/24/2023 Admitting provider: Hildred Laser, MD Discharge date: 01/24/2023   Admission Diagnoses:  Abdominal pain in pregnancy [redacted] weeks gestation  Discharge Diagnoses:  Principal Problem:   Abdominal pain in pregnancy, second trimester Active Problems:   Pregnancy  Resolving abdominal pain Reactive fetal heart tones  History of Present Illness: The patient is a 27 y.o. female [phone] at [redacted]w[redacted]d who presents for evaluation of some left sided abdominal pain that started last evening at home. The pain has been fairly constant and at times feels like a stabbing pain. She is [redacted] weeks gestation and feels good fetal movement, denies recent intercourse, denies LOF or vaginal bleeding.. She tried taking some po Tylenol, but felt that it di not help significantly.  Past Medical History:  Diagnosis Date  . 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 Allergic rhinitis   . Hematuria   . Medical history non-contributory     Past Surgical History:  Procedure Laterality Date  . VAGINAL DELIVERY    . wisdom teeth pulled      No current facility-administered medications on file prior to encounter.   Current Outpatient Medications on File Prior to Encounter  Medication Sig Dispense Refill  . Butalbital-APAP-Caffeine 50-325-40 MG capsule Take 1-2 capsules by mouth every 6 (six) hours as needed for headache. 30 capsule 3  . Prenatal Vit-Fe Fumarate-FA (MULTIVITAMIN-PRENATAL) 27-0.8 MG TABS tablet Take 1 tablet by mouth daily at 12 noon.    . sertraline (ZOLOFT) 25 MG tablet Take 1 tablet (25 mg total) by mouth daily. 90 tablet 1    Allergies  Allergen Reactions  . Femoxetine   . Ferumoxytol Rash    Social History   Socioeconomic History  . Marital status: Significant Other    Spouse name: Chanetta Marshall  . Number of children: 3   . Years of education: high school ged  . Highest education level: GED or equivalent  Occupational History  . Occupation: [employer]  Tobacco Use  . Smoking status: Never  . Smokeless tobacco: Never  Vaping Use  . Vaping Use: Former  Substance and Sexual Activity  . Alcohol use: No    Alcohol/week: 0.0 standard drinks of alcohol  . Drug use: No  . Sexual activity: Yes    Partners: Male    Birth control/protection: Surgical  Other Topics Concern  . Not on file  Social History Narrative  . Not on file   Social Determinants of Health   Financial Resource Strain: Low Risk  (10/11/2022)   Overall Financial Resource Strain (CARDIA)   . Difficulty of Paying Living Expenses: Not hard at all  Food Insecurity: No Food Insecurity (10/11/2022)   Hunger Vital Sign   . Worried About Programme researcher, broadcasting/film/video in the Last Year: Never true   . Ran Out of Food in the Last Year: Never true  Transportation Needs: No Transportation Needs (10/11/2022)   PRAPARE - Transportation   . Lack of Transportation (Medical): No   . Lack of Transportation (Non-Medical): No  Physical Activity: Sufficiently Active (10/11/2022)   Exercise Vital Sign   . Days of Exercise per Week: 5 days   . Minutes of Exercise per Session: 60 min  Stress: No Stress Concern Present (10/11/2022)   [employer] of Occupational Health - Occupational Stress Questionnaire   . Feeling of Stress : Not at  all  Social Connections: Moderately Isolated (10/11/2022)   Social Connection and Isolation Panel [NHANES]   . Frequency of Communication with Friends and Family: Twice a week   . Frequency of Social Gatherings with Friends and Family: Once a week   . Attends Religious Services: Never   . Active Member of Clubs or Organizations: No   . Attends Banker Meetings: Never   . Marital Status: Living with partner  Intimate Partner Violence: Not At Risk (10/11/2022)   Humiliation, Afraid, Rape, and Kick questionnaire   . Fear of Current  or Ex-Partner: No   . Emotionally Abused: No   . Physically Abused: No   . Sexually Abused: No    Family History  Problem Relation Age of Onset  . Hypertension Father   . Diabetes Neg Hx   . Cancer Neg Hx   . Hearing loss Neg Hx      ROS   Physical Exam: BP (!) 107/58 (BP Location: Right Arm)   Pulse 96   Temp 97.6 F (36.4 C) (Oral)   Resp 15   Ht 5\' 2"  (1.575 m)   Wt 66.7 kg   LMP 08/12/2022   BMI 26.89 kg/m   OBGyn Exam  Consults: None  Significant Findings/ Diagnostic Studies: labs:  Results for orders placed or performed during the hospital encounter of 01/24/23 (from the past 24 hour(s))  Wet prep, genital     Status: Abnormal   Collection Time: 01/24/23  3:22 PM  Result Value Ref Range   Yeast Wet Prep HPF POC NONE SEEN NONE SEEN   Trich, Wet Prep NONE SEEN NONE SEEN   Clue Cells Wet Prep HPF POC NONE SEEN NONE SEEN   WBC, Wet Prep HPF POC >=10 (A) <10   Sperm NONE SEEN   Urinalysis, Routine w reflex microscopic -Urine, Clean Catch     Status: Abnormal   Collection Time: 01/24/23  3:22 PM  Result Value Ref Range   Color, Urine YELLOW (A) YELLOW   APPearance CLOUDY (A) CLEAR   Specific Gravity, Urine 1.005 1.005 - 1.030   pH 8.0 5.0 - 8.0   Glucose, UA NEGATIVE NEGATIVE mg/dL   Hgb urine dipstick NEGATIVE NEGATIVE   Bilirubin Urine NEGATIVE NEGATIVE   Ketones, ur NEGATIVE NEGATIVE mg/dL   Protein, ur NEGATIVE NEGATIVE mg/dL   Nitrite NEGATIVE NEGATIVE   Leukocytes,Ua LARGE (A) NEGATIVE   RBC / HPF 0-5 0 - 5 RBC/hpf   WBC, UA 11-20 0 - 5 WBC/hpf   Bacteria, UA MANY (A) NONE SEEN   Squamous Epithelial / HPF 11-20 0 - 5 /HPF   Mucus PRESENT    Amorphous Crystal PRESENT   CBC     Status: Abnormal   Collection Time: 01/24/23  3:40 PM  Result Value Ref Range   WBC 9.6 4.0 - 10.5 K/uL   RBC 3.76 (L) 3.87 - 5.11 MIL/uL   Hemoglobin 10.6 (L) 12.0 - 15.0 g/dL   HCT 16.1 (L) 09.6 - 04.5 %   MCV 85.9 80.0 - 100.0 fL   MCH 28.2 26.0 - 34.0 pg   MCHC 32.8  30.0 - 36.0 g/dL   RDW 40.9 81.1 - 91.4 %   Platelets 235 150 - 400 K/uL   nRBC 0.0 0.0 - 0.2 %  Comprehensive metabolic panel     Status: Abnormal   Collection Time: 01/24/23  3:40 PM  Result Value Ref Range   Sodium 136 135 - 145 mmol/L   Potassium  2.9 (L) 3.5 - 5.1 mmol/L   Chloride 106 98 - 111 mmol/L   CO2 24 22 - 32 mmol/L   Glucose, Bld 89 70 - 99 mg/dL   BUN 6 6 - 20 mg/dL   Creatinine, Ser 1.61 0.44 - 1.00 mg/dL   Calcium 8.5 (L) 8.9 - 10.3 mg/dL   Total Protein 6.6 6.5 - 8.1 g/dL   Albumin 2.7 (L) 3.5 - 5.0 g/dL   AST 11 (L) 15 - 41 U/L   ALT 11 0 - 44 U/L   Alkaline Phosphatase 100 38 - 126 U/L   Total Bilirubin 0.3 0.3 - 1.2 mg/dL   GFR, Estimated >09 >60 mL/min   Anion gap 6 5 - 15  Amylase     Status: None   Collection Time: 01/24/23  3:40 PM  Result Value Ref Range   Amylase 50 28 - 100 U/L  Lipase, blood     Status: None   Collection Time: 01/24/23  3:40 PM  Result Value Ref Range   Lipase 28 11 - 51 U/L     Procedures: FHTS NST  FHR: 130s beats/min Fetus is premature, thus not considered candidate for NST. FHTS auscultated for over 30 minutes- moderate variability noted, and reassuring.  Tocometry: no contractions noted, fundus palpates soft.  Interpretation:  INDICATIONS: patient reassurance and rule out uterine contractions RESULTS:  A NST procedure was performed with FHR monitoring and a normal baseline established, appropriate time of 20-40 minutes of evaluation, and accels >2 seen w 15x15 characteristics.  Results show a REACTIVE NST.    Hospital Course: The patient was admitted to Labor and Delivery Triage for observation. She was place on the fetal monitor and reassuring FHTS noted. Lab work was ordered to assess for any infection or urinary problems.   Discharge Condition: {condition:18240}  Disposition: Discharge disposition: 01-Home or Self Care       Diet: {CHL DISCHARGE DIET:21201}  Discharge Activity: {CHL DISCHARGE ACTIVITY  ORDER:21200}  Discharge Instructions     Discharge activity: Bedrest   Complete by: As directed    Discharge diet:  No restrictions   Complete by: As directed    Notify physician for a general feeling that "something is not right"   Complete by: As directed    Notify physician for increase or change in vaginal discharge   Complete by: As directed    Notify physician for intestinal cramps, with or without diarrhea, sometimes described as "gas pain"   Complete by: As directed    Notify physician for leaking of fluid   Complete by: As directed    Notify physician for low, dull backache, unrelieved by heat or Tylenol   Complete by: As directed    Notify physician for menstrual like cramps   Complete by: As directed    Notify physician for pelvic pressure   Complete by: As directed    Notify physician for uterine contractions.  These may be painless and feel like the uterus is tightening or the baby is  "balling up"   Complete by: As directed    Notify physician for vaginal bleeding   Complete by: As directed    PRETERM LABOR:  Includes any of the follwing symptoms that occur between 20 - [redacted] weeks gestation.  If these symptoms are not stopped, preterm labor can result in preterm delivery, placing your baby at risk   Complete by: As directed       Allergies as of 01/24/2023  Reactions   Femoxetine    Ferumoxytol Rash        Medication List     TAKE these medications    Butalbital-APAP-Caffeine 50-325-40 MG capsule Take 1-2 capsules by mouth every 6 (six) hours as needed for headache.   multivitamin-prenatal 27-0.8 MG Tabs tablet Take 1 tablet by mouth daily at 12 noon.   sertraline 25 MG tablet Commonly known as: Zoloft Take 1 tablet (25 mg total) by mouth daily.         Total time spent taking care of this patient: *** minutes  Signed: Mirna Mires, CNM  01/24/2023, 10:14 PM

## 2023-01-24 NOTE — Discharge Summary (Signed)
  Please see Final Progress Note.  Mirna Mires, CNM  01/24/2023 10:14 PM

## 2023-02-11 ENCOUNTER — Other Ambulatory Visit: Payer: Medicaid Other

## 2023-02-11 ENCOUNTER — Ambulatory Visit (INDEPENDENT_AMBULATORY_CARE_PROVIDER_SITE_OTHER): Payer: Medicaid Other | Admitting: Certified Nurse Midwife

## 2023-02-11 ENCOUNTER — Encounter: Payer: Self-pay | Admitting: Certified Nurse Midwife

## 2023-02-11 VITALS — BP 107/68 | HR 83 | Wt 152.8 lb

## 2023-02-11 DIAGNOSIS — Z3482 Encounter for supervision of other normal pregnancy, second trimester: Secondary | ICD-10-CM

## 2023-02-11 DIAGNOSIS — Z3A26 26 weeks gestation of pregnancy: Secondary | ICD-10-CM

## 2023-02-11 LAB — POCT URINALYSIS DIPSTICK OB
Bilirubin, UA: NEGATIVE
Blood, UA: NEGATIVE
Glucose, UA: NEGATIVE
Ketones, UA: NEGATIVE
Leukocytes, UA: NEGATIVE
Nitrite, UA: NEGATIVE
POC,PROTEIN,UA: NEGATIVE
Spec Grav, UA: 1.01 (ref 1.010–1.025)
Urobilinogen, UA: 0.2 E.U./dL
pH, UA: 7 (ref 5.0–8.0)

## 2023-02-11 NOTE — Patient Instructions (Signed)
Oral Glucose Tolerance Test During Pregnancy Why am I having this test? The oral glucose tolerance test (OGTT) is done to check how your body processes blood sugar (glucose). This is one of several tests used to diagnose diabetes that develops during pregnancy (gestational diabetes mellitus). Gestational diabetes is a short-term form of diabetes that some women develop while they are pregnant. It usually occurs during the second trimester of pregnancy and goes away after delivery. Testing, or screening, for gestational diabetes usually occurs at weeks 24-28 of pregnancy. You may have the OGTT test after having a 1-hour glucose screening test if the results from that test indicate that you may have gestational diabetes. This test may also be needed if: You have a history of gestational diabetes. There is a history of giving birth to very large babies or of losing pregnancies (having stillbirths). You have signs and symptoms of diabetes, such as: Changes in your eyesight. Tingling or numbness in your hands or feet. Changes in hunger, thirst, and urination, and these are not explained by your pregnancy. What is being tested? This test measures the amount of glucose in your blood at different times during a period of 3 hours. This shows how well your body can process glucose. What kind of sample is taken?  Blood samples are required for this test. They are usually collected by inserting a needle into a blood vessel. How do I prepare for this test? For 3 days before your test, eat normally. Have plenty of carbohydrate-rich foods. Follow instructions from your health care provider about: Eating or drinking restrictions on the day of the test. You may be asked not to eat or drink anything other than water (to fast) starting 8-10 hours before the test. Changing or stopping your regular medicines. Some medicines may interfere with this test. Tell a health care provider about: All medicines you are  taking, including vitamins, herbs, eye drops, creams, and over-the-counter medicines. Any blood disorders you have. Any surgeries you have had. Any medical conditions you have. What happens during the test? First, your blood glucose will be measured. This is referred to as your fasting blood glucose because you fasted before the test. Then, you will drink a glucose solution that contains a certain amount of glucose. Your blood glucose will be measured again 1, 2, and 3 hours after you drink the solution. This test takes about 3 hours to complete. You will need to stay at the testing location during this time. During the testing period: Do not eat or drink anything other than the glucose solution. Do not exercise. Do not use any products that contain nicotine or tobacco, such as cigarettes, e-cigarettes, and chewing tobacco. These can affect your test results. If you need help quitting, ask your health care provider. The testing procedure may vary among health care providers and hospitals. How are the results reported? Your results will be reported as milligrams of glucose per deciliter of blood (mg/dL) or millimoles per liter (mmol/L). There is more than one source for screening and diagnosis reference values used to diagnose gestational diabetes. Your health care provider will compare your results to normal values that were established after testing a large group of people (reference values). Reference values may vary among labs and hospitals. For this test (Carpenter-Coustan), reference values are: Fasting: 95 mg/dL (5.3 mmol/L). 1 hour: 180 mg/dL (10.0 mmol/L). 2 hour: 155 mg/dL (8.6 mmol/L). 3 hour: 140 mg/dL (7.8 mmol/L). What do the results mean? Results below the reference values are   considered normal. If two or more of your blood glucose levels are at or above the reference values, you may be diagnosed with gestational diabetes. If only one level is high, your health care provider may  suggest repeat testing or other tests to confirm a diagnosis. Talk with your health care provider about what your results mean. Questions to ask your health care provider Ask your health care provider, or the department that is doing the test: When will my results be ready? How will I get my results? What are my treatment options? What other tests do I need? What are my next steps? Summary The oral glucose tolerance test (OGTT) is one of several tests used to diagnose diabetes that develops during pregnancy (gestational diabetes mellitus). Gestational diabetes is a short-term form of diabetes that some women develop while they are pregnant. You may have the OGTT test after having a 1-hour glucose screening test if the results from that test show that you may have gestational diabetes. You may also have this test if you have any symptoms or risk factors for this type of diabetes. Talk with your health care provider about what your results mean. This information is not intended to replace advice given to you by your health care provider. Make sure you discuss any questions you have with your health care provider. Document Revised: 02/26/2022 Document Reviewed: 12/30/2019 Elsevier Patient Education  2024 Elsevier Inc.  

## 2023-02-11 NOTE — Progress Notes (Signed)
ROB doing well. Feels good movement. 28 wk labs today: Glucose screen/RPR/CBC. , Discussed developing a birth plan.  Discussed birth control after delivery. Pt plans to have  BTL. Consent completed. Will do Tdap and BTC next visit.  Follow up 2 wk  for ROB or sooner if needed.    Doreene Burke, CNM

## 2023-02-12 LAB — 28 WEEK RH+PANEL
Basophils Absolute: 0 10*3/uL (ref 0.0–0.2)
Basos: 0 %
EOS (ABSOLUTE): 0.1 10*3/uL (ref 0.0–0.4)
Eos: 1 %
Gestational Diabetes Screen: 181 mg/dL — ABNORMAL HIGH (ref 70–139)
HIV Screen 4th Generation wRfx: NONREACTIVE
Hematocrit: 33.9 % — ABNORMAL LOW (ref 34.0–46.6)
Hemoglobin: 11.2 g/dL (ref 11.1–15.9)
Immature Grans (Abs): 0 10*3/uL (ref 0.0–0.1)
Immature Granulocytes: 1 %
Lymphocytes Absolute: 1.4 10*3/uL (ref 0.7–3.1)
Lymphs: 19 %
MCH: 27.9 pg (ref 26.6–33.0)
MCHC: 33 g/dL (ref 31.5–35.7)
MCV: 85 fL (ref 79–97)
Monocytes Absolute: 0.4 10*3/uL (ref 0.1–0.9)
Monocytes: 5 %
Neutrophils Absolute: 5.4 10*3/uL (ref 1.4–7.0)
Neutrophils: 74 %
Platelets: 240 10*3/uL (ref 150–450)
RBC: 4.01 x10E6/uL (ref 3.77–5.28)
RDW: 12.2 % (ref 11.7–15.4)
RPR Ser Ql: NONREACTIVE
WBC: 7.3 10*3/uL (ref 3.4–10.8)

## 2023-02-16 ENCOUNTER — Other Ambulatory Visit: Payer: Self-pay | Admitting: Certified Nurse Midwife

## 2023-02-16 DIAGNOSIS — O99810 Abnormal glucose complicating pregnancy: Secondary | ICD-10-CM

## 2023-02-16 DIAGNOSIS — Z131 Encounter for screening for diabetes mellitus: Secondary | ICD-10-CM

## 2023-02-16 HISTORY — DX: Abnormal glucose complicating pregnancy: O99.810

## 2023-02-17 NOTE — Progress Notes (Signed)
The patient is scheduled for 3 GTT.

## 2023-02-17 NOTE — Progress Notes (Signed)
Can you schedule a 3 hour?

## 2023-02-17 NOTE — Progress Notes (Signed)
I contacted the patient, she has already been scheduled for 7/18 for 3 GTT

## 2023-02-20 ENCOUNTER — Other Ambulatory Visit: Payer: Medicaid Other

## 2023-02-21 ENCOUNTER — Other Ambulatory Visit: Payer: Self-pay | Admitting: Certified Nurse Midwife

## 2023-02-21 DIAGNOSIS — O24419 Gestational diabetes mellitus in pregnancy, unspecified control: Secondary | ICD-10-CM

## 2023-02-21 HISTORY — DX: Gestational diabetes mellitus in pregnancy, unspecified control: O24.419

## 2023-02-21 LAB — GESTATIONAL GLUCOSE TOLERANCE
Glucose, Fasting: 82 mg/dL (ref 70–94)
Glucose, GTT - 1 Hour: 201 mg/dL — ABNORMAL HIGH (ref 70–179)
Glucose, GTT - 2 Hour: 179 mg/dL — ABNORMAL HIGH (ref 70–154)
Glucose, GTT - 3 Hour: 138 mg/dL (ref 70–139)

## 2023-02-21 MED ORDER — BLOOD GLUCOSE MONITORING SUPPL DEVI: 1.0000 | Freq: Three times a day (TID) | 0 refills | Status: DC

## 2023-02-21 MED ORDER — LANCETS MISC. MISC: 1.0000 | Freq: Four times a day (QID) | 3 refills | Status: AC

## 2023-02-21 MED ORDER — BLOOD GLUCOSE TEST VI STRP: 1.0000 | ORAL_STRIP | Freq: Four times a day (QID) | 3 refills | Status: DC

## 2023-02-21 MED ORDER — LANCET DEVICE MISC: 1.0000 | Freq: Three times a day (TID) | 0 refills | Status: AC

## 2023-02-26 ENCOUNTER — Encounter: Payer: Medicaid Other | Attending: Certified Nurse Midwife | Admitting: Dietician

## 2023-02-26 ENCOUNTER — Encounter: Payer: Self-pay | Admitting: Dietician

## 2023-02-26 DIAGNOSIS — O24419 Gestational diabetes mellitus in pregnancy, unspecified control: Secondary | ICD-10-CM | POA: Diagnosis present

## 2023-02-26 DIAGNOSIS — Z713 Dietary counseling and surveillance: Secondary | ICD-10-CM | POA: Insufficient documentation

## 2023-02-26 NOTE — Progress Notes (Signed)
Patient was seen on 02/26/2023 for Gestational Diabetes self-management class at the [facility]. The following learning objectives were met by the patient during this course:  States the definition of Gestational Diabetes States why dietary management is important in controlling blood glucose Describes the effects each nutrient has on blood glucose levels Demonstrates ability to create a balanced meal plan Demonstrates carbohydrate counting  States when to check blood glucose levels Demonstrates proper blood glucose monitoring techniques States the effect of stress and exercise on blood glucose levels States the importance of limiting caffeine and abstaining from alcohol and smoking   Patient instructed to monitor glucose levels: FBS: 60 - <90 1 hour: <140 2 hour: <120  *Patient received handouts: Nutrition Diabetes and Pregnancy Carbohydrate Counting List  Patient will be seen for follow-up as needed.

## 2023-02-27 ENCOUNTER — Ambulatory Visit (INDEPENDENT_AMBULATORY_CARE_PROVIDER_SITE_OTHER): Payer: Medicaid Other | Admitting: Advanced Practice Midwife

## 2023-02-27 ENCOUNTER — Encounter: Payer: Self-pay | Admitting: Advanced Practice Midwife

## 2023-02-27 VITALS — BP 102/60 | HR 99 | Wt 154.1 lb

## 2023-02-27 DIAGNOSIS — Z3A28 28 weeks gestation of pregnancy: Secondary | ICD-10-CM

## 2023-02-27 DIAGNOSIS — Z23 Encounter for immunization: Secondary | ICD-10-CM

## 2023-02-27 DIAGNOSIS — Z369 Encounter for antenatal screening, unspecified: Secondary | ICD-10-CM

## 2023-02-27 DIAGNOSIS — Z3483 Encounter for supervision of other normal pregnancy, third trimester: Secondary | ICD-10-CM

## 2023-02-27 DIAGNOSIS — Z3480 Encounter for supervision of other normal pregnancy, unspecified trimester: Secondary | ICD-10-CM

## 2023-02-27 DIAGNOSIS — O24410 Gestational diabetes mellitus in pregnancy, diet controlled: Secondary | ICD-10-CM

## 2023-02-27 LAB — POCT URINALYSIS DIPSTICK
Bilirubin, UA: NEGATIVE
Blood, UA: NEGATIVE
Glucose, UA: NEGATIVE
Ketones, UA: NEGATIVE
Leukocytes, UA: NEGATIVE
Nitrite, UA: NEGATIVE
Protein, UA: NEGATIVE
Spec Grav, UA: 1.02 (ref 1.010–1.025)
Urobilinogen, UA: 0.2 E.U./dL
pH, UA: 7.5 (ref 5.0–8.0)

## 2023-02-27 NOTE — Progress Notes (Addendum)
Routine Prenatal Care Visit  Subjective  Marcia Hodges is a 27 y.o. [phone] at [redacted]w[redacted]d being seen today for ongoing prenatal care.  She is currently monitored for the following issues for this low-risk pregnancy and has Supervision of other normal pregnancy, antepartum; Anxiety and depression; Pregnancy; Abdominal pain in pregnancy, second trimester; Impaired glucose in pregnancy, antepartum; and Gestational diabetes on their problem list.  ----------------------------------------------------------------------------------- Patient reports no complaints.  She had Lifestyles class yesterday and has been making dietary changes- mainly cutting out soda. Contractions: Not present.  .  Movement: Present. Leaking Fluid denies.  ----------------------------------------------------------------------------------- The following portions of the patient's history were reviewed and updated as appropriate: allergies, current medications, past family history, past medical history, past social history, past surgical history and problem list. Problem list updated.  Objective  Last menstrual period 08/12/2022, not currently breastfeeding. BP: 102/60, P: 99 Pregravid weight 145 lb (65.8 kg) Total Weight Gain 7 lb 12.8 oz (3.538 kg) Urinalysis: Urine Protein    Urine Glucose    BS log: has been checking for the past 3 days; fasting are all normal, post prandial normal except after lunch two values are elevated- 123 and 130.  Fetal Status: Fetal Heart Rate (bpm): 143 Fundal Height: 29 cm Movement: Present     General:  Alert, oriented and cooperative. Patient is in no acute distress.  Skin: Skin is warm and dry. No rash noted.   Cardiovascular: Normal heart rate noted  Respiratory: Normal respiratory effort, no problems with respiration noted  Abdomen: Soft, gravid, appropriate for gestational age. Pain/Pressure: Absent     Pelvic:  Cervical exam deferred        Extremities: Normal range of motion.  Edema:  None  Mental Status: Normal mood and affect. Normal behavior. Normal judgment and thought content.   Assessment   27 y.o. [id number] at [redacted]w[redacted]d by  05/19/2023, Alternate EDD Entry presenting for routine prenatal visit  Plan   G4 Problems (from 10/11/22 to present)     Problem Noted Resolved   Gestational diabetes 02/21/2023 by [name], CNM No   Impaired glucose in pregnancy, antepartum 02/16/2023 by [name], CNM No   Anxiety and depression 12/17/2022 by [name], CNM No   Overview Signed 12/17/2022 10:10 AM by [name], CNM    Zoloft prescribed 12/17/22       Clinical Staff Provider  Office Location  [facility] Dating  By LMP  Language  English Anatomy US    Flu Vaccine  2024 Genetic Screen  NIPS: negative, female  TDaP vaccine   offer Hgb A1C or  GTT Early : Third trimester : elevated  Covid Has not    LAB RESULTS   Rhogam  A Positive    --/--/A POS (05/05 1033)  Feeding Plan breast Antibody  NEG (05/05 1033)  Contraception tubal Rubella 3.43 (03/28 6213)  Circumcision yes RPR   Non Reactive (03/28 0937)  Pediatrician  [facility]  HBsAg   Negative (03/28 0937)  Support Person Fob and mom HIV  Non Reactive (03/28 0865)  Prenatal Classes no Varicella     GBS  (For PCN allergy, check sensitivities)   BTL Consent needs Hep C     VBAC Consent  Pap Diagnosis  Date Value Ref Range Status  10/12/2020   Final   - Negative for intraepithelial lesion or malignancy (NILM)      Hgb Electro      CF  SMA          Preterm labor symptoms and general obstetric precautions including but not limited to vaginal bleeding, contractions, leaking of fluid and fetal movement were reviewed in detail with the patient. Please refer to After Visit Summary for other counseling recommendations.   Return in about 2 weeks (around 03/13/2023) for f/u growth scan and rob after.  Tresea Mall, CNM 02/27/2023 11:11 AM

## 2023-03-03 ENCOUNTER — Ambulatory Visit
Admission: RE | Admit: 2023-03-03 | Discharge: 2023-03-03 | Disposition: A | Discharge status: Home / Self Care | Payer: Medicaid Other | Source: Ambulatory Visit | Attending: Advanced Practice Midwife | Admitting: Advanced Practice Midwife

## 2023-03-03 DIAGNOSIS — Z369 Encounter for antenatal screening, unspecified: Secondary | ICD-10-CM | POA: Insufficient documentation

## 2023-03-03 DIAGNOSIS — Z3A29 29 weeks gestation of pregnancy: Secondary | ICD-10-CM | POA: Diagnosis not present

## 2023-03-03 DIAGNOSIS — O24410 Gestational diabetes mellitus in pregnancy, diet controlled: Secondary | ICD-10-CM | POA: Insufficient documentation

## 2023-03-03 DIAGNOSIS — O321XX0 Maternal care for breech presentation, not applicable or unspecified: Secondary | ICD-10-CM | POA: Diagnosis not present

## 2023-03-03 DIAGNOSIS — O24419 Gestational diabetes mellitus in pregnancy, unspecified control: Secondary | ICD-10-CM | POA: Diagnosis present

## 2023-03-03 DIAGNOSIS — Z3480 Encounter for supervision of other normal pregnancy, unspecified trimester: Secondary | ICD-10-CM | POA: Insufficient documentation

## 2023-03-13 ENCOUNTER — Ambulatory Visit (INDEPENDENT_AMBULATORY_CARE_PROVIDER_SITE_OTHER): Payer: Medicaid Other

## 2023-03-13 VITALS — BP 101/68 | HR 81 | Wt 152.0 lb

## 2023-03-13 DIAGNOSIS — Z3480 Encounter for supervision of other normal pregnancy, unspecified trimester: Secondary | ICD-10-CM

## 2023-03-13 DIAGNOSIS — O24410 Gestational diabetes mellitus in pregnancy, diet controlled: Secondary | ICD-10-CM

## 2023-03-13 DIAGNOSIS — Z3A30 30 weeks gestation of pregnancy: Secondary | ICD-10-CM

## 2023-03-13 NOTE — Assessment & Plan Note (Signed)
Reviewed blood sugar log: 2/18 elevated FBG, 15/52 2-hr postprandial elevated (~28%). Has attended Lifestyles class and has been working on diet. Keeping log of food and is learning that things like bread and french fries cause elevated sugars. Encouraged her to continue with diet modifications. Most recent growth ultrasound at 29 weeks shows EFW 42%tile, AFI 14.5. Plan for repeat growth ultrasound in 3 weeks.

## 2023-03-13 NOTE — Progress Notes (Signed)
    Return Prenatal Note   Assessment/Plan   Plan  27 y.o. G4P3003 at [redacted]w[redacted]d presents for follow-up OB visit. Reviewed prenatal record including previous visit note.  Supervision of other normal pregnancy, antepartum Reviewed kick counts and preterm labor warning signs. Instructed to call office or come to hospital with persistent headache, vision changes, regular contractions, leaking of fluid, decreased fetal movement or vaginal bleeding.  Gestational diabetes Reviewed blood sugar log: 2/18 elevated FBG, 15/52 2-hr postprandial elevated (~28%). Has attended Lifestyles class and has been working on diet. Keeping log of food and is learning that things like bread and french fries cause elevated sugars. Encouraged her to continue with diet modifications. Most recent growth ultrasound at 29 weeks shows EFW 42%tile, AFI 14.5. Plan for repeat growth ultrasound in 3 weeks.    No orders of the defined types were placed in this encounter.  Return in about 2 weeks (around 03/27/2023) for ROB.   Future Appointments  Date Time Provider Department Center  03/27/2023  1:15 PM Glenetta Borg, CNM [facility]OB-[facility] None  04/03/2023  2:00 PM AOB-AOB [place] 1 AOB-IM[facility] None  04/10/2023  1:15 PM Doreene Burke, CNM [facility] None  04/24/2023  1:15 PM Glenetta Borg, CNM [facility] None  05/01/2023  1:15 PM Dominic, Courtney Heys, CNM [facility]OB[facility] None  05/08/2023  1:15 PM Glenetta Borg, CNM AOB[facility] None    For next visit:  continue with routine prenatal care     Subjective   27 y.o. [id number] at [redacted]w[redacted]d presents for this follow-up prenatal visit.  Patient has questions about number of guests allowed at hospital.  Patient reports: Movement: Present Contractions: Not present  Objective   Flow sheet Vitals: Pulse Rate: 81 BP: 101/68 Fundal Height: 30 cm Fetal Heart Rate (bpm): 160 Presentation: Vertex Total weight gain: 7 lb (3.175 kg)  General Appearance  No acute distress, well appearing, and well  nourished Pulmonary   Normal work of breathing Neurologic   Alert and oriented to person, place, and time Psychiatric   Mood and affect within normal limits  Lindalou Hose Ballard Budney, CNM  08/08/241:29 PM

## 2023-03-13 NOTE — Assessment & Plan Note (Signed)
Reviewed kick counts and preterm labor warning signs. Instructed to call office or come to hospital with persistent headache, vision changes, regular contractions, leaking of fluid, decreased fetal movement or vaginal bleeding. 

## 2023-03-19 ENCOUNTER — Encounter: Payer: Self-pay | Admitting: Licensed Practical Nurse

## 2023-03-27 ENCOUNTER — Encounter: Payer: Self-pay | Admitting: Obstetrics

## 2023-03-27 ENCOUNTER — Ambulatory Visit: Payer: Medicaid Other | Admitting: Obstetrics

## 2023-03-27 ENCOUNTER — Other Ambulatory Visit (HOSPITAL_COMMUNITY)
Admission: RE | Admit: 2023-03-27 | Discharge: 2023-03-27 | Disposition: A | Discharge status: Home / Self Care | Payer: Medicaid Other | Source: Ambulatory Visit | Attending: Obstetrics | Admitting: Obstetrics

## 2023-03-27 VITALS — BP 105/66 | HR 89 | Wt 152.0 lb

## 2023-03-27 DIAGNOSIS — N898 Other specified noninflammatory disorders of vagina: Secondary | ICD-10-CM | POA: Diagnosis present

## 2023-03-27 DIAGNOSIS — Z3480 Encounter for supervision of other normal pregnancy, unspecified trimester: Secondary | ICD-10-CM

## 2023-03-27 DIAGNOSIS — Z3A32 32 weeks gestation of pregnancy: Secondary | ICD-10-CM

## 2023-03-27 DIAGNOSIS — O24410 Gestational diabetes mellitus in pregnancy, diet controlled: Secondary | ICD-10-CM

## 2023-03-27 LAB — POCT URINALYSIS DIPSTICK OB
Bilirubin, UA: NEGATIVE
Blood, UA: NEGATIVE
Glucose, UA: NEGATIVE
Ketones, UA: NEGATIVE
Leukocytes, UA: NEGATIVE
Nitrite, UA: NEGATIVE
POC,PROTEIN,UA: NEGATIVE
Spec Grav, UA: 1.01 (ref 1.010–1.025)
Urobilinogen, UA: 0.2 E.U./dL
pH, UA: 6 (ref 5.0–8.0)

## 2023-03-27 NOTE — Progress Notes (Signed)
    Return Prenatal Note   Assessment/Plan   Plan  27 y.o. G4P3003 at [redacted]w[redacted]d presents for follow-up OB visit. Reviewed prenatal record including previous visit note.  Gestational diabetes -Reviewed blood sugar log today. Mostly WNL. -Reviewed s/s of PTL and when to go to the hospital -Swab collected for vaginal discharge -S>D today. Already has growth [place] scheduled for 04/03/23    Orders Placed This Encounter  Procedures   POC Urinalysis Dipstick OB   Return in about 2 weeks (around 04/10/2023).   Future Appointments  Date Time Provider Department Center  04/03/2023  2:00 PM AOB-AOB [place] 1 AOB-[facility] None  04/10/2023  1:15 PM Doreene Burke, CNM AOB-[facility] None  04/24/2023  1:15 PM Glenetta Borg, CNM AOB-[facility] None  05/01/2023  1:15 PM Dominic, Courtney Heys, CNM AOB-A[facility] None  05/08/2023  1:15 PM Glenetta Borg, CNM AOB-A[facility] None    For next visit:  continue with routine prenatal care     Subjective   Kapri is figuring out what foods work for her and managing blood sugars well. She is getting everything organized for the baby - bag is already packed! Her in-laws will watch the older children when she goes into labor. She is having frequent BH ctx and leaking thin vaginal discharge. She is wearing her abdominal support belt at work and staying active every day.  Movement: Present Contractions: Not present  Objective   Flow sheet Vitals: Pulse Rate: 89 BP: 105/66 Fundal Height: 35 cm Fetal Heart Rate (bpm): 130 Total weight gain: 7 lb (3.175 kg)  General Appearance  No acute distress, well appearing, and well nourished Pulmonary   Normal work of breathing Neurologic   Alert and oriented to person, place, and time Psychiatric   Mood and affect within normal limits  Guadlupe Spanish, CNM 03/27/23 1:47 PM

## 2023-03-27 NOTE — Assessment & Plan Note (Signed)
-  Reviewed blood sugar log today. Mostly WNL. -Reviewed s/s of PTL and when to go to the hospital -Swab collected for vaginal discharge -S>D today. Already has growth [place] scheduled for 04/03/23

## 2023-03-31 LAB — CERVICOVAGINAL ANCILLARY ONLY
Bacterial Vaginitis (gardnerella): NEGATIVE
Candida Glabrata: NEGATIVE
Candida Vaginitis: NEGATIVE
Comment: NEGATIVE
Comment: NEGATIVE
Comment: NEGATIVE

## 2023-04-03 ENCOUNTER — Ambulatory Visit (INDEPENDENT_AMBULATORY_CARE_PROVIDER_SITE_OTHER): Payer: Medicaid Other

## 2023-04-03 DIAGNOSIS — O24410 Gestational diabetes mellitus in pregnancy, diet controlled: Secondary | ICD-10-CM

## 2023-04-03 DIAGNOSIS — Z3480 Encounter for supervision of other normal pregnancy, unspecified trimester: Secondary | ICD-10-CM

## 2023-04-03 DIAGNOSIS — Z3A33 33 weeks gestation of pregnancy: Secondary | ICD-10-CM

## 2023-04-10 ENCOUNTER — Encounter: Payer: Self-pay | Admitting: Certified Nurse Midwife

## 2023-04-10 ENCOUNTER — Ambulatory Visit (INDEPENDENT_AMBULATORY_CARE_PROVIDER_SITE_OTHER): Payer: Medicaid Other | Admitting: Certified Nurse Midwife

## 2023-04-10 VITALS — BP 98/64 | HR 90 | Wt 152.1 lb

## 2023-04-10 DIAGNOSIS — Z3A34 34 weeks gestation of pregnancy: Secondary | ICD-10-CM

## 2023-04-10 DIAGNOSIS — O24419 Gestational diabetes mellitus in pregnancy, unspecified control: Secondary | ICD-10-CM

## 2023-04-10 LAB — POCT URINALYSIS DIPSTICK OB
Bilirubin, UA: NEGATIVE
Blood, UA: NEGATIVE
Glucose, UA: NEGATIVE
Ketones, UA: NEGATIVE
Leukocytes, UA: NEGATIVE
Nitrite, UA: NEGATIVE
POC,PROTEIN,UA: NEGATIVE
Spec Grav, UA: <=1.005 — AB (ref 1.010–1.025)
Urobilinogen, UA: 0.2 U/dL
pH, UA: 6.5 (ref 5.0–8.0)

## 2023-04-10 NOTE — Progress Notes (Signed)
ROB doing well, feeling good movement. BS log reviewed. She has 1 elevated fasting 122. She has a few 3 2 hr pp that are just out side of range 126,128,128 and 2 2 hr pp 141, 140. Consulted Dr. Valentino Saxon. No changes at this time. Reviewed diet choices and recommend avoidance of creamy dressings.   Follow up in 2 weeks.   Doreene Burke, CNM

## 2023-04-10 NOTE — Patient Instructions (Signed)

## 2023-04-15 ENCOUNTER — Encounter: Payer: Self-pay | Admitting: Certified Nurse Midwife

## 2023-04-15 NOTE — Telephone Encounter (Signed)
Spoke with patient. She describes contraction as tightening of her entire abdomen and radiating to her back. She was fairly uncomfortable/unable to talk during the contractions. Advised of remaining well hydrated. Can try tylenol, warm bath, pelvic rest. If she has more than four of those type contractions in one hour, she should report to Emergency Department to be sent to Labor and Delivery for monitoring. She can also contact our office and be advised if she is unsure. Patient again denies leaking of fluid, vaginal bleeding. She will monitor and contact us back with any further questions or concerns.

## 2023-04-24 ENCOUNTER — Ambulatory Visit (INDEPENDENT_AMBULATORY_CARE_PROVIDER_SITE_OTHER): Payer: Medicaid Other | Admitting: Obstetrics

## 2023-04-24 ENCOUNTER — Other Ambulatory Visit (HOSPITAL_COMMUNITY)
Admission: RE | Admit: 2023-04-24 | Discharge: 2023-04-24 | Disposition: A | Discharge status: Home / Self Care | Payer: Medicaid Other | Source: Ambulatory Visit | Attending: Obstetrics | Admitting: Obstetrics

## 2023-04-24 VITALS — BP 106/70 | HR 85 | Wt 153.1 lb

## 2023-04-24 DIAGNOSIS — Z3483 Encounter for supervision of other normal pregnancy, third trimester: Secondary | ICD-10-CM

## 2023-04-24 DIAGNOSIS — Z113 Encounter for screening for infections with a predominantly sexual mode of transmission: Secondary | ICD-10-CM

## 2023-04-24 DIAGNOSIS — Z3A36 36 weeks gestation of pregnancy: Secondary | ICD-10-CM

## 2023-04-24 DIAGNOSIS — Z3480 Encounter for supervision of other normal pregnancy, unspecified trimester: Secondary | ICD-10-CM

## 2023-04-24 LAB — POCT URINALYSIS DIPSTICK
Bilirubin, UA: NEGATIVE
Blood, UA: NEGATIVE
Glucose, UA: NEGATIVE
Ketones, UA: NEGATIVE
Leukocytes, UA: NEGATIVE
Nitrite, UA: NEGATIVE
Protein, UA: NEGATIVE
Spec Grav, UA: 1.015 (ref 1.010–1.025)
Urobilinogen, UA: 0.2 E.U./dL
pH, UA: 7 (ref 5.0–8.0)

## 2023-04-24 NOTE — Assessment & Plan Note (Signed)
-  Blood sugar log shows 6/20 elevated readings -Doing daily walks

## 2023-04-24 NOTE — Addendum Note (Signed)
Addended by: Burtis Junes on: 04/24/2023 01:47 PM   Modules accepted: Orders

## 2023-04-24 NOTE — Progress Notes (Addendum)
    Return Prenatal Note   Assessment/Plan   Plan  27 y.o. [id number] at [redacted]w[redacted]d presents for follow-up OB visit. Reviewed prenatal record including previous visit note.  Gestational diabetes -Blood sugar log shows 6/20 elevated readings -Doing daily walks   Supervision of other normal pregnancy, antepartum -GBS and GC/chlamydia swabs today -Reviewed s/s of labor and when to go to the hospital -Encouraged to increase hydration to 8-10 bottles of water daily -Cephalic by BSUS   Orders Placed This Encounter  Procedures   Strep Gp B NAA   Return in about 1 week (around 05/01/2023).   Future Appointments  Date Time Provider Department Center  05/01/2023  9:15 AM AOB-AOB [place] 1 AOB-IMG None  05/01/2023  1:15 PM Dominic, Courtney Heys, CNM AOB-AOB None  05/08/2023  1:15 PM Doreene Burke, CNM AOB-AOB None  05/15/2023  1:55 PM Mirna Mires, CNM AOB-AOB None    For next visit:  continue with routine prenatal care     Subjective   Marcia Hodges has been having long runs of painful contractions. She denies LOF and vaginal bleeding. She is concerned about PTL. She has been staying active and her blood sugars have mostly been within range.  Movement: Present Contractions: Irritability  Objective   Flow sheet Vitals: Pulse Rate: 85 BP: 106/70 Dilation: 1 Effacement (%): 40 Station: -2 Total weight gain: 8 lb 1.6 oz (3.674 kg)  General Appearance  No acute distress, well appearing, and well nourished Pulmonary   Normal work of breathing Neurologic   Alert and oriented to person, place, and time Psychiatric   Mood and affect within normal limits  Marcia Hodges, CNM 04/24/23 1:44 PM

## 2023-04-24 NOTE — Assessment & Plan Note (Addendum)
-  GBS and GC/chlamydia swabs today -Reviewed s/s of labor and when to go to the hospital -Encouraged to increase hydration to 8-10 bottles of water daily -Cephalic by BSUS

## 2023-04-26 LAB — STREP GP B NAA: Strep Gp B NAA: POSITIVE — AB

## 2023-04-28 ENCOUNTER — Encounter: Payer: Self-pay | Admitting: Certified Nurse Midwife

## 2023-04-28 LAB — CERVICOVAGINAL ANCILLARY ONLY
Chlamydia: NEGATIVE
Comment: NEGATIVE
Comment: NORMAL
Neisseria Gonorrhea: NEGATIVE

## 2023-05-01 ENCOUNTER — Encounter: Payer: Self-pay | Admitting: Licensed Practical Nurse

## 2023-05-01 ENCOUNTER — Ambulatory Visit: Payer: Medicaid Other

## 2023-05-01 ENCOUNTER — Ambulatory Visit: Payer: Medicaid Other | Admitting: Licensed Practical Nurse

## 2023-05-01 VITALS — BP 110/71 | HR 91 | Wt 154.8 lb

## 2023-05-01 DIAGNOSIS — O24410 Gestational diabetes mellitus in pregnancy, diet controlled: Secondary | ICD-10-CM

## 2023-05-01 DIAGNOSIS — Z3A38 38 weeks gestation of pregnancy: Secondary | ICD-10-CM | POA: Diagnosis not present

## 2023-05-01 DIAGNOSIS — O24419 Gestational diabetes mellitus in pregnancy, unspecified control: Secondary | ICD-10-CM | POA: Diagnosis not present

## 2023-05-01 DIAGNOSIS — Z3A37 37 weeks gestation of pregnancy: Secondary | ICD-10-CM

## 2023-05-01 DIAGNOSIS — Z3483 Encounter for supervision of other normal pregnancy, third trimester: Secondary | ICD-10-CM

## 2023-05-01 DIAGNOSIS — Z3A34 34 weeks gestation of pregnancy: Secondary | ICD-10-CM

## 2023-05-01 LAB — POCT URINALYSIS DIPSTICK OB
Bilirubin, UA: NEGATIVE
Blood, UA: NEGATIVE
Glucose, UA: NEGATIVE
Ketones, UA: NEGATIVE
Nitrite, UA: NEGATIVE
POC,PROTEIN,UA: NEGATIVE
Spec Grav, UA: 1.01 (ref 1.010–1.025)
Urobilinogen, UA: 0.2 E.U./dL
pH, UA: 6 (ref 5.0–8.0)

## 2023-05-01 NOTE — Progress Notes (Signed)
Routine Prenatal Care Visit  Subjective  Marcia Hodges is a 27 y.o. [phone] at [redacted]w[redacted]d being seen today for ongoing prenatal care.  She is currently monitored for the following issues for this high-risk pregnancy and has Supervision of other normal pregnancy, antepartum; Anxiety and depression; Pregnancy; Impaired glucose in pregnancy, antepartum; and Gestational diabetes on their problem list.  ----------------------------------------------------------------------------------- Patient reports  tired, lower abdominal and pelvic pressure-makes it difficult to walk at times. .   -Mood has been good. 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  -Has plenty of family nearby for PP support, her fiance will be her labor support -Had growth [place] today EFW 3338grams 66.2%, AFI 15.90, reassured pt this is normal, pt concerned about having a big baby, her other children were 6 and 7 lbs.  -GDM, reviewed with Dr Logan Bores, no need to add medication   date fasting Breakfast Lunch Dinner  9/20 84 118 122 90  9/21   121   9/22 91 115  81  9/23 95 98  127  9/24 98 79 134 122  9/25 82 83 103 124  9/26 89 115      Contractions: Not present. Vag. Bleeding: None.  Movement: Present. Leaking Fluid denies.  ----------------------------------------------------------------------------------- The following portions of the patient's history were reviewed and updated as appropriate: allergies, current medications, past family history, past medical history, past social history, past surgical history and problem list. Problem list updated.  Objective  Blood pressure 110/71, pulse 91, weight 154 lb 12.8 oz (70.2 kg), last menstrual period 08/12/2022. Pregravid weight 145 lb (65.8 kg) Total Weight Gain 9 lb 12.8 oz (4.445 kg) Urinalysis: Urine Protein Negative  Urine Glucose Negative  Fetal Status: Fetal Heart Rate  (bpm): 140 Fundal Height: 37 cm Movement: Present  Presentation: Vertex  General:  Alert, oriented and cooperative. Patient is in no acute distress.  Skin: Skin is warm and dry. No rash noted.   Cardiovascular: Normal heart rate noted  Respiratory: Normal respiratory effort, no problems with respiration noted  Abdomen: Soft, gravid, appropriate for gestational age. Pain/Pressure: Present     Pelvic:  Cervical exam performed Dilation: 1 Effacement (%): 40 Station: -3  Extremities: Normal range of motion.  Edema: None  Mental Status: Normal mood and affect. Normal behavior. Normal judgment and thought content.   Assessment   27 y.o. [id number] at [redacted]w[redacted]d by  05/19/2023, Alternate EDD Entry presenting for routine prenatal visit  Plan   G4 Problems (from 10/11/22 to present)     Problem Noted Resolved   Gestational diabetes 02/21/2023 by [name], CNM No   Overview Addendum 03/27/2023  1:48 PM by Glenetta Borg, CNM    Diet-controlled 29 wks: EFW 1,344g 42.2%ile, AFI 14.5      Impaired glucose in pregnancy, antepartum 02/16/2023 by [name], CNM No   Anxiety and depression 12/17/2022 by [name], CNM No   Overview Signed 12/17/2022 10:10 AM by [name], CNM    Zoloft prescribed 12/17/22      Supervision of other normal pregnancy, antepartum 01/28/2021 by Linzie Collin, MD No   Overview Addendum 03/13/2023  1:11 PM by Free, Lindalou Hose, CNM     Clinical Staff Provider  Office Location  [facility] Dating  By LMP  Language  English Anatomy US    Flu Vaccine  2024 Genetic Screen  NIPS:  negative, female  TDaP vaccine   02/27/2023 Hgb A1C or  GTT Early : NA Third trimester : elevated- GDM A1  Covid Has not    LAB RESULTS   Rhogam  A Positive    --/--/A POS (05/05 1033)  Feeding Plan breast Antibody  NEG (05/05 1033)  Contraception tubal Rubella 3.43 (03/28 7829)  Circumcision yes RPR   Non Reactive (03/28 0937)  Pediatrician  [facility]   HBsAg   Negative (03/28 0937)  Support Person Fob and mom HIV  Non Reactive (03/28 5621)  Prenatal Classes no Varicella Immune    GBS  (For PCN allergy, check sensitivities)   BTL Consent needs Hep C     VBAC Consent  Pap Diagnosis  Date Value Ref Range Status  10/12/2020   Final   - Negative for intraepithelial lesion or malignancy (NILM)      Hgb Electro      CF      SMA                   Term labor symptoms and general obstetric precautions including but not limited to vaginal bleeding, contractions, leaking of fluid and fetal movement were reviewed in detail with the patient. Please refer to After Visit Summary for other counseling recommendations.   Return in about 1 week (around 05/08/2023) for ROB.  Carie Caddy, CNM   [facility]  05/01/23  4:34 PM

## 2023-05-01 NOTE — Progress Notes (Signed)
ROB [redacted]w[redacted]d: She is doing well. She reports good fetal movement and pressure. She has no new concerns today.

## 2023-05-08 ENCOUNTER — Encounter: Payer: Self-pay | Admitting: Certified Nurse Midwife

## 2023-05-08 ENCOUNTER — Ambulatory Visit: Payer: Medicaid Other | Admitting: Certified Nurse Midwife

## 2023-05-08 VITALS — BP 116/76 | HR 73 | Wt 154.0 lb

## 2023-05-08 DIAGNOSIS — Z3480 Encounter for supervision of other normal pregnancy, unspecified trimester: Secondary | ICD-10-CM

## 2023-05-08 DIAGNOSIS — O24410 Gestational diabetes mellitus in pregnancy, diet controlled: Secondary | ICD-10-CM

## 2023-05-08 DIAGNOSIS — Z3A38 38 weeks gestation of pregnancy: Secondary | ICD-10-CM

## 2023-05-08 LAB — POCT URINALYSIS DIPSTICK OB
Bilirubin, UA: NEGATIVE
Blood, UA: NEGATIVE
Glucose, UA: NEGATIVE
Ketones, UA: NEGATIVE
Leukocytes, UA: NEGATIVE
Nitrite, UA: NEGATIVE
POC,PROTEIN,UA: NEGATIVE
Spec Grav, UA: 1.02 (ref 1.010–1.025)
Urobilinogen, UA: 0.2 U/dL
pH, UA: 7 (ref 5.0–8.0)

## 2023-05-08 NOTE — Patient Instructions (Signed)
.  Braxton Hicks Contractions  Contractions of the uterus can occur throughout pregnancy, but they are not always a sign that you are in labor. You may have practice contractions called Braxton Hicks contractions. These false labor contractions are sometimes confused with true labor. What are Braxton Hicks contractions? Braxton Hicks contractions are tightening movements that occur in the muscles of the uterus before labor. Unlike true labor contractions, these contractions do not result in opening (dilation) and thinning of the lowest part of the uterus (cervix). Toward the end of pregnancy (32-34 weeks), Braxton Hicks contractions can happen more often and may become stronger. These contractions are sometimes difficult to tell apart from true labor because they can be very uncomfortable. How to tell the difference between true labor and false labor True labor Contractions last 30-70 seconds. Contractions become very regular. Discomfort is usually felt in the top of the uterus, and it spreads to the lower abdomen and low back. Contractions do not go away with walking. Contractions usually become stronger and more frequent. The cervix dilates and gets thinner. False labor Contractions are usually shorter, weaker, and farther apart than true labor contractions. Contractions are usually irregular. Contractions are often felt in the front of the lower abdomen and in the groin. Contractions may go away when you walk around or change positions while lying down. The cervix usually does not dilate or become thin. Sometimes, the only way to tell if you are in true labor is for your health care provider to look for changes in your cervix. Your health care provider will do a physical exam and may monitor your contractions. If you are in true labor, your health care provider will send you home with instructions about when to return to the hospital. You may continue to have Braxton Hicks contractions until  you go into true labor. Follow these instructions at home:  Take over-the-counter and prescription medicines only as told by your health care provider. If Braxton Hicks contractions are making you uncomfortable: Change your position from lying down or resting to walking, or change from walking to resting. Sit and rest in a tub of warm water. Drink enough fluid to keep your urine pale yellow. Dehydration may cause these contractions. Do slow and deep breathing several times an hour. Keep all follow-up visits. This is important. Contact a health care provider if: You have a fever. You have continuous pain in your abdomen. Your contractions become stronger, more regular, and closer together. You pass blood-tinged mucus. Get help right away if: You have fluid leaking or gushing from your vagina. You have bright red blood coming from your vagina. Your baby is not moving inside you as much as it used to. Summary You may have practice contractions called Braxton Hicks contractions. These false labor contractions are sometimes confused with true labor. Braxton Hicks contractions are usually shorter, weaker, farther apart, and less regular than true labor contractions. True labor contractions usually become stronger, more regular, and more frequent. Manage discomfort from Braxton Hicks contractions by changing position, resting in a warm bath, practicing deep breathing, and drinking plenty of water. Keep all follow-up visits. Contact your health care provider if your contractions become stronger, more regular, and closer together. This information is not intended to replace advice given to you by your health care provider. Make sure you discuss any questions you have with your health care provider. Document Revised: 05/29/2020 Document Reviewed: 05/29/2020 Elsevier Patient Education  2024 [employer]vier Inc.  

## 2023-05-08 NOTE — Progress Notes (Signed)
ROB doing well, feeling good movement. Has had some episodes of braxton hicks. Labor precautions reviewed. BS lof reviewed  Date Fasting Breakfast Lunch Dinner  9/27 95 122  123  9/28  91  96 110  119  9/29 89 85  73 125  9/30 94 79  112 126  10/1 80  79 97  10/2 83 89 92 121  10/3 89 102        No significant change from last weeks results. Pt encouraged to continue monitoring and bringing in her log.   Sve per pt request 1.5/ 50/-2   Follow up 1 wk.   Doreene Burke, CNM

## 2023-05-10 ENCOUNTER — Inpatient Hospital Stay: Payer: Medicaid Other | Admitting: Anesthesiology

## 2023-05-10 ENCOUNTER — Other Ambulatory Visit: Payer: Self-pay

## 2023-05-10 ENCOUNTER — Encounter: Payer: Self-pay | Admitting: Obstetrics and Gynecology

## 2023-05-10 ENCOUNTER — Inpatient Hospital Stay
Admission: EM | Admit: 2023-05-10 | Discharge: 2023-05-12 | DRG: 798 | Disposition: A | Discharge status: Home / Self Care | Payer: Medicaid Other | Attending: Certified Nurse Midwife | Admitting: Certified Nurse Midwife

## 2023-05-10 DIAGNOSIS — Z8249 Family history of ischemic heart disease and other diseases of the circulatory system: Secondary | ICD-10-CM | POA: Diagnosis not present

## 2023-05-10 DIAGNOSIS — O479 False labor, unspecified: Secondary | ICD-10-CM | POA: Diagnosis present

## 2023-05-10 DIAGNOSIS — Z3A38 38 weeks gestation of pregnancy: Secondary | ICD-10-CM | POA: Diagnosis not present

## 2023-05-10 DIAGNOSIS — Z23 Encounter for immunization: Secondary | ICD-10-CM | POA: Diagnosis not present

## 2023-05-10 DIAGNOSIS — F32A Depression, unspecified: Secondary | ICD-10-CM | POA: Diagnosis present

## 2023-05-10 DIAGNOSIS — Z79899 Other long term (current) drug therapy: Secondary | ICD-10-CM | POA: Diagnosis not present

## 2023-05-10 DIAGNOSIS — Z302 Encounter for sterilization: Secondary | ICD-10-CM

## 2023-05-10 DIAGNOSIS — O24410 Gestational diabetes mellitus in pregnancy, diet controlled: Secondary | ICD-10-CM

## 2023-05-10 DIAGNOSIS — F419 Anxiety disorder, unspecified: Secondary | ICD-10-CM | POA: Diagnosis present

## 2023-05-10 DIAGNOSIS — O24420 Gestational diabetes mellitus in childbirth, diet controlled: Secondary | ICD-10-CM | POA: Diagnosis present

## 2023-05-10 DIAGNOSIS — O24419 Gestational diabetes mellitus in pregnancy, unspecified control: Secondary | ICD-10-CM | POA: Diagnosis not present

## 2023-05-10 DIAGNOSIS — O99824 Streptococcus B carrier state complicating childbirth: Secondary | ICD-10-CM | POA: Diagnosis present

## 2023-05-10 DIAGNOSIS — Z888 Allergy status to other drugs, medicaments and biological substances status: Secondary | ICD-10-CM | POA: Diagnosis not present

## 2023-05-10 DIAGNOSIS — O26893 Other specified pregnancy related conditions, third trimester: Secondary | ICD-10-CM | POA: Diagnosis present

## 2023-05-10 DIAGNOSIS — O99344 Other mental disorders complicating childbirth: Secondary | ICD-10-CM | POA: Diagnosis present

## 2023-05-10 DIAGNOSIS — O99810 Abnormal glucose complicating pregnancy: Secondary | ICD-10-CM

## 2023-05-10 DIAGNOSIS — Z3480 Encounter for supervision of other normal pregnancy, unspecified trimester: Principal | ICD-10-CM

## 2023-05-10 DIAGNOSIS — O99820 Streptococcus B carrier state complicating pregnancy: Secondary | ICD-10-CM | POA: Diagnosis not present

## 2023-05-10 LAB — TYPE AND SCREEN
ABO/RH(D): A POS
Antibody Screen: NEGATIVE

## 2023-05-10 LAB — CBC
HCT: 37.1 % (ref 36.0–46.0)
Hemoglobin: 12.1 g/dL (ref 12.0–15.0)
MCH: 26 pg (ref 26.0–34.0)
MCHC: 32.6 g/dL (ref 30.0–36.0)
MCV: 79.6 fL — ABNORMAL LOW (ref 80.0–100.0)
Platelets: 177 10*3/uL (ref 150–400)
RBC: 4.66 MIL/uL (ref 3.87–5.11)
RDW: 14.1 % (ref 11.5–15.5)
WBC: 8.6 10*3/uL (ref 4.0–10.5)
nRBC: 0 % (ref 0.0–0.2)

## 2023-05-10 LAB — GLUCOSE, CAPILLARY
Glucose-Capillary: 73 mg/dL (ref 70–99)
Glucose-Capillary: 96 mg/dL (ref 70–99)

## 2023-05-10 MED ORDER — FENTANYL CITRATE (PF) 100 MCG/2ML IJ SOLN: 50.0000 ug | INTRAMUSCULAR | Status: DC | PRN

## 2023-05-10 MED ORDER — MISOPROSTOL 200 MCG PO TABS
ORAL_TABLET | ORAL | Status: AC
  Filled 2023-05-10: qty 4

## 2023-05-10 MED ORDER — ACETAMINOPHEN 325 MG PO TABS: 650.0000 mg | ORAL_TABLET | ORAL | Status: DC | PRN

## 2023-05-10 MED ORDER — OXYTOCIN BOLUS FROM INFUSION
333.0000 mL | Freq: Once | INTRAVENOUS | Status: AC
  Administered 2023-05-11: 333 mL via INTRAVENOUS

## 2023-05-10 MED ORDER — LACTATED RINGERS IV SOLN: 500.0000 mL | INTRAVENOUS | Status: DC | PRN

## 2023-05-10 MED ORDER — LIDOCAINE-EPINEPHRINE (PF) 1.5 %-1:200000 IJ SOLN
INTRAMUSCULAR | Status: DC | PRN
  Administered 2023-05-10: 3 mL via EPIDURAL

## 2023-05-10 MED ORDER — PHENYLEPHRINE 80 MCG/ML (10ML) SYRINGE FOR IV PUSH (FOR BLOOD PRESSURE SUPPORT): 80.0000 ug | PREFILLED_SYRINGE | INTRAVENOUS | Status: DC | PRN

## 2023-05-10 MED ORDER — EPHEDRINE 5 MG/ML INJ: 10.0000 mg | INTRAVENOUS | Status: DC | PRN

## 2023-05-10 MED ORDER — OXYTOCIN-SODIUM CHLORIDE 30-0.9 UT/500ML-% IV SOLN
2.5000 [IU]/h | INTRAVENOUS | Status: DC
  Administered 2023-05-11: 2.5 [IU]/h via INTRAVENOUS
  Filled 2023-05-10: qty 500

## 2023-05-10 MED ORDER — LACTATED RINGERS IV SOLN: INTRAVENOUS | Status: DC

## 2023-05-10 MED ORDER — AMMONIA AROMATIC IN INHA
RESPIRATORY_TRACT | Status: AC
  Filled 2023-05-10: qty 10

## 2023-05-10 MED ORDER — LIDOCAINE HCL (PF) 1 % IJ SOLN
30.0000 mL | INTRAMUSCULAR | Status: DC | PRN
  Filled 2023-05-10: qty 30

## 2023-05-10 MED ORDER — SERTRALINE HCL 25 MG PO TABS
25.0000 mg | ORAL_TABLET | Freq: Every day | ORAL | Status: DC
  Administered 2023-05-10 – 2023-05-11 (×2): 25 mg via ORAL
  Filled 2023-05-10 (×2): qty 1

## 2023-05-10 MED ORDER — SODIUM CHLORIDE 0.9 % IV SOLN
INTRAVENOUS | Status: DC | PRN
  Administered 2023-05-10: 7 mL via EPIDURAL

## 2023-05-10 MED ORDER — DIPHENHYDRAMINE HCL 50 MG/ML IJ SOLN: 12.5000 mg | INTRAMUSCULAR | Status: DC | PRN

## 2023-05-10 MED ORDER — LIDOCAINE HCL (PF) 1 % IJ SOLN
INTRAMUSCULAR | Status: DC | PRN
  Administered 2023-05-10: 3 mL via SUBCUTANEOUS

## 2023-05-10 MED ORDER — PENICILLIN G POT IN DEXTROSE 60000 UNIT/ML IV SOLN
3.0000 10*6.[IU] | INTRAVENOUS | Status: DC
  Administered 2023-05-11 (×2): 3 10*6.[IU] via INTRAVENOUS
  Filled 2023-05-10 (×4): qty 50

## 2023-05-10 MED ORDER — OXYTOCIN 10 UNIT/ML IJ SOLN
INTRAMUSCULAR | Status: AC
  Filled 2023-05-10: qty 2

## 2023-05-10 MED ORDER — FLEET ENEMA RE ENEM: 1.0000 | ENEMA | RECTAL | Status: DC | PRN

## 2023-05-10 MED ORDER — SOD CITRATE-CITRIC ACID 500-334 MG/5ML PO SOLN: 30.0000 mL | ORAL | Status: DC | PRN

## 2023-05-10 MED ORDER — FENTANYL-BUPIVACAINE-NACL 0.5-0.125-0.9 MG/250ML-% EP SOLN
12.0000 mL/h | EPIDURAL | Status: DC | PRN
  Administered 2023-05-10: 12 mL/h via EPIDURAL
  Filled 2023-05-10: qty 250

## 2023-05-10 MED ORDER — ONDANSETRON HCL 4 MG/2ML IJ SOLN: 4.0000 mg | Freq: Four times a day (QID) | INTRAMUSCULAR | Status: DC | PRN

## 2023-05-10 MED ORDER — SODIUM CHLORIDE 0.9 % IV SOLN
5.0000 10*6.[IU] | Freq: Once | INTRAVENOUS | Status: AC
  Administered 2023-05-10: 5 10*6.[IU] via INTRAVENOUS
  Filled 2023-05-10: qty 5

## 2023-05-10 MED ORDER — LACTATED RINGERS IV SOLN: 500.0000 mL | Freq: Once | INTRAVENOUS | Status: DC

## 2023-05-10 NOTE — H&P (Cosign Needed Addendum)
[facility] Labor & Delivery  History and Physical   HPI   Chief Complaint: contractions  Marcia Hodges is a 27 y.o. [phone] at [redacted]w[redacted]d who presents for contractions that began early this morning. They were irregular and mild and became more frequent, regular & painful beginning at 3pm. She endorses fetal movement, denies loss of fluid or vaginal bleeding.  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  Pregnancy Complications Patient Active Problem List   Diagnosis Date Noted   Uterine contractions 05/10/2023   Gestational diabetes 02/21/2023   Impaired glucose in pregnancy, antepartum 02/16/2023   Pregnancy 01/24/2023   Anxiety and depression 12/17/2022   Supervision of other normal pregnancy, antepartum 01/28/2021    Review of Systems A twelve point review of systems was negative except as stated in HPI.   HISTORY   Medications Medications Prior to Admission  Medication Sig Dispense Refill Last Dose   Blood Glucose Monitoring Suppl DEVI 1 each by Does not apply route in the morning, at noon, and at bedtime. May substitute to any manufacturer covered by patient's insurance. 1 each 0 Past Week   Butalbital-APAP-Caffeine 50-325-40 MG capsule Take 1-2 capsules by mouth every 6 (six) hours as needed for headache. 30 capsule 3 Past Week   Glucose Blood (BLOOD GLUCOSE TEST STRIPS) STRP 1 each by In Vitro route in the morning, at noon, in the evening, and at bedtime. May substitute to any manufacturer covered by patient's insurance. Check blood sugar four times daily, fasting and two hours after each meal 200 strip 3 Past Week   Prenatal Vit-Fe Fumarate-FA (MULTIVITAMIN-PRENATAL) 27-0.8 MG TABS tablet Take 1 tablet by mouth daily at 12 noon.   05/09/2023   sertraline (ZOLOFT) 25 MG tablet Take 1 tablet (25 mg total) by mouth daily. 90 tablet 1 Past Week    Allergies is allergic to femoxetine and ferumoxytol.   OB History OB  History  Gravida Para Term Preterm AB Living  4 3 3  0 0 3  SAB IAB Ectopic Multiple Live Births  0 0 0 0 3    # Outcome Date GA Lbr Len/2nd Weight Sex Type Anes PTL Lv  4 Current           3 Term 04/20/21 [redacted]w[redacted]d 06:05 / 00:02 2770 g M Vag-Spont None  LIV     Name: NARELLE, SCHOENING     Apgar1: 9  Apgar5: 9  2 Term 11/26/18 [redacted]w[redacted]d / 00:09 3340 g M Vag-Spont EPI  LIV     Name: April Holding     Apgar1: 9  Apgar5: 9  1 Term 04/25/16 [redacted]w[redacted]d  3210 g M Vag-Spont EPI  LIV     Name: April Holding     Apgar1: 7  Apgar5: 9    Past Medical History Past Medical History:  Diagnosis Date   Abnormal breast exam    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 She has never used smokeless tobacco. She reports that she does not drink alcohol and does not use drugs.   Family History family history includes Hypertension in her father.   PHYSICAL EXAM   Vitals:   05/10/23  2010  BP: 123/79  Pulse: 90    Constitutional: No acute distress, well appearing, and well nourished. Neurologic: She is alert and conversational.  Psychiatric: She has a normal mood and affect.  Musculoskeletal: Normal gait, grossly normal range of motion Cardiovascular: Normal rate.   Pulmonary/Chest: Normal work of breathing.  Gastrointestinal/Abdominal: Soft. Gravid. There is no tenderness.  Skin: Skin is warm and dry. No rash noted.  Genitourinary: Normal external female genitalia.  SVE:   Dilation: 4 Effacement (%): 60 Station: -2 Exam by:: Robb Matar CNM   NST Interpretation Indication: contractions Baseline: 140 bpm Variability: moderate Accelerations: present Decelerations: none Contractions: regular, every 2-4  minutes Time noted:  See OBIX Impression: reactive Authenticated by: [name]    ASSESSMENT AND PLAN   Marcia Hodges is a 27 y.o. [phone] at [redacted]w[redacted]d with EDD: 05/19/2023, Alternate EDD Entry admitted for early labor with history of rapid active phase.  Expectant management  Fetal Status: - cephalic presentation by SVE, sutures palpated - EFW: 7.5lb by [place], 66% by ultrasound 9/26, 3338g - CEFM - FHT currently cat I  A1GDM - CBG on admission & q2h in active labor  Depression/anxiety - continue home zoloft dose at 25mg   Labs/Immunizations: TDAP: received prenatally 02/27/23 Flu: will offer postpartum Rubella: immune Varicella immune HIV: NR Hep B: Negative Hep C: NR RPR: NR GBS: positive COVID: declined vaccination RSV: not given prenatally Lab Results  Component Value Date   VZVIGG 393 10/31/2022   HIV Non Reactive 02/11/2023   HIV NON REACTIVE 11/26/2018     Postpartum Plan: - Feeding: Breast Milk and Formula - Contraception: plans Postpartum BTL - Prenatal Care Provider: [facility]  Attending Dr. Logan Bores was immediately available for the care of the patient.

## 2023-05-10 NOTE — OB Triage Note (Signed)
Patient is a  27 yo, G4 P3, at 38 weeks 5 days. Patient presents with complaints of CTX every 5-6 mins apart, rated a 6/10 by the patient.  Patient denies any vaginal bleeding or LOF. Patient reports +FM. Monitors applied and assessing. VSS. Initial fetal heart tone 135 bpm . Keitha Butte, CNM notified of patients arrival to unit. Plan to complete labor eval

## 2023-05-10 NOTE — Anesthesia Procedure Notes (Signed)
Epidural Patient location during procedure: OB  Staffing Anesthesiologist: Arita Miss, MD Performed: anesthesiologist   Preanesthetic Checklist Completed: patient identified, IV checked, site marked, risks and benefits discussed, surgical consent, monitors and equipment checked, pre-op evaluation and timeout performed  Epidural Patient position: sitting Prep: ChloraPrep Patient monitoring: heart rate, continuous pulse ox and blood pressure Approach: midline Location: L3-L4 Injection technique: LOR saline  Needle:  Needle type: Tuohy  Needle gauge: 17 G Needle length: 9 cm Needle insertion depth: 5 cm Catheter type: closed end flexible Catheter size: 19 Gauge Catheter at skin depth: 10 cm Test dose: negative and 1.5% lidocaine with Epi 1:200 K  Assessment Sensory level: T10 Events: blood not aspirated, no cerebrospinal fluid, injection not painful, no injection resistance, no paresthesia and negative IV test  Additional Notes First/one attempt Pt. Evaluated and documentation done after procedure finished. Patient identified. Risks/Benefits/Options discussed with patient including but not limited to bleeding, infection, nerve damage, paralysis, failed block, incomplete pain control, headache, blood pressure changes, nausea, vomiting, reactions to medication both or allergic, itching and postpartum back pain. Confirmed with bedside nurse the patient's most recent platelet count. Confirmed with patient that they are not currently taking any anticoagulation, have any bleeding history or any family history of bleeding disorders. Patient expressed understanding and wished to proceed. All questions were answered. Sterile technique was used throughout the entire procedure. Please see nursing notes for vital signs. Test dose was given through epidural catheter and negative prior to continuing to dose epidural or start infusion. Warning signs of high block given to the patient including  shortness of breath, tingling/numbness in hands, complete motor block, or any concerning symptoms with instructions to call for help. Patient was given instructions on fall risk and not to get out of bed. All questions and concerns addressed with instructions to call with any issues or inadequate analgesia.     Patient tolerated the insertion well without immediate complications.  Reason for block: procedure for painReason for block:procedure for pain

## 2023-05-10 NOTE — Anesthesia Preprocedure Evaluation (Signed)
Anesthesia Evaluation  Patient identified by MRN, date of birth, ID band Patient awake    Reviewed: Allergy & Precautions, NPO status , Patient's Chart, lab work & pertinent test results  History of Anesthesia Complications Negative for: history of anesthetic complications  Airway Mallampati: II  TM Distance: >3 FB Neck ROM: Full   Comment: Tongue piercing in place Dental  (+) Poor Dentition   Pulmonary neg pulmonary ROS, neg sleep apnea, neg COPD, Patient abstained from smoking.Not current smoker   Pulmonary exam normal breath sounds clear to auscultation       Cardiovascular Exercise Tolerance: Good METS(-) hypertension(-) CAD and (-) Past MI negative cardio ROS (-) dysrhythmias  Rhythm:Regular Rate:Normal - Systolic murmurs    Neuro/Psych  PSYCHIATRIC DISORDERS Anxiety Depression    negative neurological ROS     GI/Hepatic ,neg GERD  ,,(+)     (-) substance abuse    Endo/Other  diabetes, Well Controlled, Gestational    Renal/GU negative Renal ROS     Musculoskeletal   Abdominal   Peds  Hematology   Anesthesia Other Findings Past Medical History: No date: Abnormal breast exam     Comment:  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 No date: Allergic rhinitis No date: Hematuria No date: Medical history non-contributory  Reproductive/Obstetrics (+) Pregnancy                             Anesthesia Physical Anesthesia Plan  ASA: 2  Anesthesia Plan: Epidural   Post-op Pain Management:    Induction:   PONV Risk Score and Plan: 2 and Treatment may vary due to age or medical condition and Ondansetron  Airway Management Planned: Natural Airway  Additional Equipment:   Intra-op Plan:   Post-operative Plan:   Informed Consent: I have reviewed the patients History and Physical, chart, labs and discussed the procedure including the  risks, benefits and alternatives for the proposed anesthesia with the patient or authorized representative who has indicated his/her understanding and acceptance.       Plan Discussed with: Surgeon  Anesthesia Plan Comments: (Discussed R/B/A of neuraxial anesthesia technique with patient: - rare risks of spinal/epidural hematoma, nerve damage, infection - Risk of PDPH - Risk of itching - Risk of nausea and vomiting - Risk of poor block necessitating replacement of epidural. - Risk of allergic reactions. Patient voiced understanding.)       Anesthesia Quick Evaluation

## 2023-05-11 ENCOUNTER — Encounter
Admission: EM | Disposition: A | Discharge status: Home / Self Care | Payer: Self-pay | Source: Home / Self Care | Attending: Certified Nurse Midwife

## 2023-05-11 ENCOUNTER — Inpatient Hospital Stay: Payer: Medicaid Other | Admitting: Anesthesiology

## 2023-05-11 ENCOUNTER — Other Ambulatory Visit: Payer: Self-pay

## 2023-05-11 ENCOUNTER — Encounter: Payer: Self-pay | Admitting: Obstetrics and Gynecology

## 2023-05-11 DIAGNOSIS — F32A Depression, unspecified: Secondary | ICD-10-CM

## 2023-05-11 DIAGNOSIS — Z3A38 38 weeks gestation of pregnancy: Secondary | ICD-10-CM

## 2023-05-11 DIAGNOSIS — O24419 Gestational diabetes mellitus in pregnancy, unspecified control: Secondary | ICD-10-CM

## 2023-05-11 DIAGNOSIS — O99344 Other mental disorders complicating childbirth: Secondary | ICD-10-CM

## 2023-05-11 DIAGNOSIS — Z302 Encounter for sterilization: Secondary | ICD-10-CM

## 2023-05-11 DIAGNOSIS — F419 Anxiety disorder, unspecified: Secondary | ICD-10-CM

## 2023-05-11 DIAGNOSIS — O99820 Streptococcus B carrier state complicating pregnancy: Secondary | ICD-10-CM

## 2023-05-11 HISTORY — PX: TUBAL LIGATION: SHX77

## 2023-05-11 LAB — RPR: RPR Ser Ql: NONREACTIVE

## 2023-05-11 LAB — GLUCOSE, CAPILLARY
Glucose-Capillary: 88 mg/dL (ref 70–99)
Glucose-Capillary: 113 mg/dL — ABNORMAL HIGH (ref 70–99)
Glucose-Capillary: 85 mg/dL (ref 70–99)

## 2023-05-11 LAB — HIV ANTIBODY (ROUTINE TESTING W REFLEX): HIV Screen 4th Generation wRfx: NONREACTIVE

## 2023-05-11 SURGERY — LIGATION, FALLOPIAN TUBE, POSTPARTUM
Anesthesia: General | Site: Abdomen | Laterality: Bilateral

## 2023-05-11 MED ORDER — IBUPROFEN 600 MG PO TABS
600.0000 mg | ORAL_TABLET | Freq: Four times a day (QID) | ORAL | Status: DC
  Administered 2023-05-11 – 2023-05-12 (×3): 600 mg via ORAL
  Filled 2023-05-11 (×3): qty 1

## 2023-05-11 MED ORDER — ONDANSETRON HCL 4 MG/2ML IJ SOLN
INTRAMUSCULAR | Status: DC | PRN
  Administered 2023-05-11: 4 mg via INTRAVENOUS

## 2023-05-11 MED ORDER — BENZOCAINE-MENTHOL 20-0.5 % EX AERO
1.0000 | INHALATION_SPRAY | CUTANEOUS | Status: DC | PRN
  Filled 2023-05-11: qty 56

## 2023-05-11 MED ORDER — KETOROLAC TROMETHAMINE 30 MG/ML IJ SOLN
INTRAMUSCULAR | Status: AC
  Filled 2023-05-11: qty 1

## 2023-05-11 MED ORDER — ONDANSETRON 4 MG PO TBDP: 4.0000 mg | ORAL_TABLET | Freq: Four times a day (QID) | ORAL | Status: DC | PRN

## 2023-05-11 MED ORDER — BUPIVACAINE HCL 0.5 % IJ SOLN
INTRAMUSCULAR | Status: DC | PRN
  Administered 2023-05-11: 7 mL

## 2023-05-11 MED ORDER — KETOROLAC TROMETHAMINE 30 MG/ML IJ SOLN: 30.0000 mg | Freq: Once | INTRAMUSCULAR | Status: DC

## 2023-05-11 MED ORDER — FENTANYL CITRATE (PF) 100 MCG/2ML IJ SOLN
INTRAMUSCULAR | Status: AC
  Filled 2023-05-11: qty 2

## 2023-05-11 MED ORDER — BUPIVACAINE HCL (PF) 0.5 % IJ SOLN
INTRAMUSCULAR | Status: AC
  Filled 2023-05-11: qty 30

## 2023-05-11 MED ORDER — OXYCODONE HCL 5 MG PO TABS: 5.0000 mg | ORAL_TABLET | Freq: Once | ORAL | Status: DC | PRN

## 2023-05-11 MED ORDER — ONDANSETRON HCL 4 MG/2ML IJ SOLN
INTRAMUSCULAR | Status: AC
  Filled 2023-05-11: qty 2

## 2023-05-11 MED ORDER — DEXAMETHASONE SODIUM PHOSPHATE 10 MG/ML IJ SOLN
INTRAMUSCULAR | Status: AC
  Filled 2023-05-11: qty 1

## 2023-05-11 MED ORDER — ONDANSETRON HCL 4 MG PO TABS: 4.0000 mg | ORAL_TABLET | ORAL | Status: DC | PRN

## 2023-05-11 MED ORDER — ROCURONIUM BROMIDE 100 MG/10ML IV SOLN
INTRAVENOUS | Status: DC | PRN
  Administered 2023-05-11: 90 mg via INTRAVENOUS

## 2023-05-11 MED ORDER — ONDANSETRON HCL 4 MG/2ML IJ SOLN: 4.0000 mg | INTRAMUSCULAR | Status: DC | PRN

## 2023-05-11 MED ORDER — PROPOFOL 10 MG/ML IV BOLUS
INTRAVENOUS | Status: DC | PRN
  Administered 2023-05-11: 200 mg via INTRAVENOUS

## 2023-05-11 MED ORDER — FENTANYL CITRATE (PF) 100 MCG/2ML IJ SOLN
INTRAMUSCULAR | Status: DC | PRN
  Administered 2023-05-11 (×2): 50 ug via INTRAVENOUS

## 2023-05-11 MED ORDER — DEXAMETHASONE SODIUM PHOSPHATE 10 MG/ML IJ SOLN
INTRAMUSCULAR | Status: DC | PRN
  Administered 2023-05-11: 5 mg via INTRAVENOUS

## 2023-05-11 MED ORDER — ROCURONIUM BROMIDE 10 MG/ML (PF) SYRINGE
PREFILLED_SYRINGE | INTRAVENOUS | Status: AC
  Filled 2023-05-11: qty 10

## 2023-05-11 MED ORDER — SODIUM CHLORIDE 0.9 % IV SOLN
INTRAVENOUS | Status: DC | PRN
Start: 2023-05-11 — End: 2023-05-11

## 2023-05-11 MED ORDER — DEXTROSE IN LACTATED RINGERS 5 % IV SOLN: INTRAVENOUS | Status: DC

## 2023-05-11 MED ORDER — ACETAMINOPHEN 325 MG PO TABS
650.0000 mg | ORAL_TABLET | ORAL | Status: DC | PRN
  Administered 2023-05-11 (×2): 650 mg via ORAL
  Filled 2023-05-11 (×2): qty 2

## 2023-05-11 MED ORDER — SENNOSIDES-DOCUSATE SODIUM 8.6-50 MG PO TABS
2.0000 | ORAL_TABLET | Freq: Every day | ORAL | Status: DC
  Administered 2023-05-12: 2 via ORAL
  Filled 2023-05-11: qty 2

## 2023-05-11 MED ORDER — ZOLPIDEM TARTRATE 5 MG PO TABS: 5.0000 mg | ORAL_TABLET | Freq: Every evening | ORAL | Status: DC | PRN

## 2023-05-11 MED ORDER — DIPHENHYDRAMINE HCL 25 MG PO CAPS: 25.0000 mg | ORAL_CAPSULE | Freq: Four times a day (QID) | ORAL | Status: DC | PRN

## 2023-05-11 MED ORDER — SIMETHICONE 80 MG PO CHEW: 80.0000 mg | CHEWABLE_TABLET | ORAL | Status: DC | PRN

## 2023-05-11 MED ORDER — PROPOFOL 10 MG/ML IV BOLUS
INTRAVENOUS | Status: AC
  Filled 2023-05-11: qty 20

## 2023-05-11 MED ORDER — WITCH HAZEL-GLYCERIN EX PADS
1.0000 | MEDICATED_PAD | CUTANEOUS | Status: DC | PRN
  Filled 2023-05-11: qty 100

## 2023-05-11 MED ORDER — INFLUENZA VIRUS VACC SPLIT PF (FLUZONE) 0.5 ML IM SUSY
0.5000 mL | PREFILLED_SYRINGE | INTRAMUSCULAR | Status: AC
  Administered 2023-05-12: 0.5 mL via INTRAMUSCULAR
  Filled 2023-05-11: qty 0.5

## 2023-05-11 MED ORDER — KETOROLAC TROMETHAMINE 30 MG/ML IJ SOLN
INTRAMUSCULAR | Status: DC | PRN
  Administered 2023-05-11: 30 mg via INTRAVENOUS

## 2023-05-11 MED ORDER — 0.9 % SODIUM CHLORIDE (POUR BTL) OPTIME
TOPICAL | Status: DC | PRN
  Administered 2023-05-11: 500 mL

## 2023-05-11 MED ORDER — DIBUCAINE (PERIANAL) 1 % EX OINT
1.0000 | TOPICAL_OINTMENT | CUTANEOUS | Status: DC | PRN
  Filled 2023-05-11: qty 28

## 2023-05-11 MED ORDER — OXYCODONE HCL 5 MG/5ML PO SOLN: 5.0000 mg | Freq: Once | ORAL | Status: DC | PRN

## 2023-05-11 MED ORDER — OXYCODONE-ACETAMINOPHEN 5-325 MG PO TABS: 1.0000 | ORAL_TABLET | ORAL | Status: DC | PRN

## 2023-05-11 MED ORDER — PRENATAL MULTIVITAMIN CH
1.0000 | ORAL_TABLET | Freq: Every day | ORAL | Status: DC
  Administered 2023-05-11 – 2023-05-12 (×2): 1 via ORAL
  Filled 2023-05-11 (×2): qty 1

## 2023-05-11 MED ORDER — COCONUT OIL OIL
1.0000 | TOPICAL_OIL | Status: DC | PRN
  Filled 2023-05-11: qty 7.5

## 2023-05-11 MED ORDER — ONDANSETRON HCL 4 MG/2ML IJ SOLN: 4.0000 mg | Freq: Four times a day (QID) | INTRAMUSCULAR | Status: DC | PRN

## 2023-05-11 MED ORDER — SUGAMMADEX SODIUM 200 MG/2ML IV SOLN
INTRAVENOUS | Status: DC | PRN
  Administered 2023-05-11: 200 mg via INTRAVENOUS
  Administered 2023-05-11: 120 mg via INTRAVENOUS

## 2023-05-11 MED ORDER — OXYTOCIN-SODIUM CHLORIDE 30-0.9 UT/500ML-% IV SOLN
1.0000 m[IU]/min | INTRAVENOUS | Status: DC
  Administered 2023-05-11: 2 m[IU]/min via INTRAVENOUS

## 2023-05-11 MED ORDER — FENTANYL CITRATE (PF) 100 MCG/2ML IJ SOLN
25.0000 ug | INTRAMUSCULAR | Status: DC | PRN
  Administered 2023-05-11 (×2): 25 ug via INTRAVENOUS

## 2023-05-11 MED ORDER — LIDOCAINE HCL (PF) 2 % IJ SOLN
INTRAMUSCULAR | Status: AC
  Filled 2023-05-11: qty 5

## 2023-05-11 MED ORDER — TERBUTALINE SULFATE 1 MG/ML IJ SOLN: 0.2500 mg | Freq: Once | INTRAMUSCULAR | Status: DC | PRN

## 2023-05-11 MED ORDER — LIDOCAINE HCL (CARDIAC) PF 100 MG/5ML IV SOSY
PREFILLED_SYRINGE | INTRAVENOUS | Status: DC | PRN
  Administered 2023-05-11: 60 mg via INTRAVENOUS

## 2023-05-11 SURGICAL SUPPLY — 39 items
ADH SKN CLS APL DERMABOND .7 (GAUZE/BANDAGES/DRESSINGS) ×1
APL PRP STRL LF DISP 70% ISPRP (MISCELLANEOUS) ×1
APL SKNCLS STERI-STRIP NONHPOA (GAUZE/BANDAGES/DRESSINGS)
BENZOIN TINCTURE PRP APPL 2/3 (GAUZE/BANDAGES/DRESSINGS) ×1 IMPLANT
BLADE SURG 15 STRL LF DISP TIS (BLADE) ×1 IMPLANT
BLADE SURG 15 STRL SS (BLADE) ×1
BNDG ADH 2 X3.75 FABRIC TAN LF (GAUZE/BANDAGES/DRESSINGS) ×1 IMPLANT
BNDG ADH XL 3.75X2 STRCH LF (GAUZE/BANDAGES/DRESSINGS) ×1
CHLORAPREP W/TINT 26 (MISCELLANEOUS) ×1 IMPLANT
CLIP FILSHIE TUBAL LIGA STRL (Clip) ×1 IMPLANT
DERMABOND ADVANCED .7 DNX12 (GAUZE/BANDAGES/DRESSINGS) IMPLANT
DRAPE LAPAROTOMY 77X122 PED (DRAPES) ×1 IMPLANT
DRSG TEGADERM 2-3/8X2-3/4 SM (GAUZE/BANDAGES/DRESSINGS) ×1 IMPLANT
DRSG TELFA 3X4 N-ADH STERILE (GAUZE/BANDAGES/DRESSINGS) ×1 IMPLANT
ELECT REM PT RETURN 9FT ADLT (ELECTROSURGICAL) ×1
ELECTRODE REM PT RTRN 9FT ADLT (ELECTROSURGICAL) IMPLANT
GAUZE 4X4 16PLY ~~LOC~~+RFID DBL (SPONGE) ×1 IMPLANT
GLOVE PI ORTHO PRO STRL 7.5 (GLOVE) ×1 IMPLANT
GOWN STRL REUS W/ TWL LRG LVL3 (GOWN DISPOSABLE) ×1 IMPLANT
GOWN STRL REUS W/ TWL XL LVL3 (GOWN DISPOSABLE) ×1 IMPLANT
GOWN STRL REUS W/TWL LRG LVL3 (GOWN DISPOSABLE) ×1
GOWN STRL REUS W/TWL XL LVL3 (GOWN DISPOSABLE) ×1
KIT TURNOVER CYSTO (KITS) ×1 IMPLANT
LABEL OR SOLS (LABEL) ×1 IMPLANT
MANIFOLD NEPTUNE II (INSTRUMENTS) ×1 IMPLANT
NDL HYPO 25GX1X1/2 BEV (NEEDLE) ×1 IMPLANT
NEEDLE HYPO 25GX1X1/2 BEV (NEEDLE) ×1 IMPLANT
NS IRRIG 500ML POUR BTL (IV SOLUTION) ×1 IMPLANT
PACK BASIN MINOR ARMC (MISCELLANEOUS) ×1 IMPLANT
RETRACTOR RING XSMALL (MISCELLANEOUS) ×1 IMPLANT
RTRCTR WOUND ALEXIS 13CM XS SH (MISCELLANEOUS) ×1
SCRUB CHG 4% DYNA-HEX 4OZ (MISCELLANEOUS) ×1 IMPLANT
STRIP CLOSURE SKIN 1/2X4 (GAUZE/BANDAGES/DRESSINGS) ×1 IMPLANT
SUT VIC AB 4-0 PS2 18 (SUTURE) ×1 IMPLANT
SUT VICRYL 0 UR6 27IN ABS (SUTURE) ×2 IMPLANT
SYR 10ML LL (SYRINGE) ×1 IMPLANT
TAPE SURG TRANSPORE 1 IN (GAUZE/BANDAGES/DRESSINGS) ×1 IMPLANT
TRAP FLUID SMOKE EVACUATOR (MISCELLANEOUS) ×1 IMPLANT
WATER STERILE IRR 500ML POUR (IV SOLUTION) ×1 IMPLANT

## 2023-05-11 NOTE — Plan of Care (Signed)
  Problem: Education: Goal: Knowledge of General Education information will improve Description: Including pain rating scale, medication(s)/side effects and non-pharmacologic comfort measures Outcome: Progressing   Problem: Health Behavior/Discharge Planning: Goal: Ability to manage health-related needs will improve Outcome: Progressing   Problem: Clinical Measurements: Goal: Ability to maintain clinical measurements within normal limits will improve Outcome: Progressing   Problem: Clinical Measurements: Goal: Will remain free from infection Outcome: Progressing   Problem: Elimination: Goal: Will not experience complications related to bowel motility Outcome: Progressing   Problem: Elimination: Goal: Will not experience complications related to urinary retention Outcome: Progressing

## 2023-05-11 NOTE — Progress Notes (Addendum)
LABOR NOTE   SUBJECTIVE:   Marcia Hodges is a 27 y.o.  [phone]  at [redacted]w[redacted]d in early active labor. Comfortable with epidural.   Analgesia: Epidural  OBJECTIVE:  BP (!) 109/58   Pulse 76   Temp 98.4 F (36.9 C) (Oral)   Ht 5\' 2"  (1.575 m)   Wt 70.3 kg   LMP 08/12/2022   SpO2 100%   BMI 28.35 kg/m  Total I/O In: -  Out: 450 [Urine:450]  SVE:   Dilation: 6 Effacement (%): 70 Station: -3 Exam by:: Keitha Butte, CNM CONTRACTIONS: regular, every 2-4 minutes FHR: Fetal heart tracing reviewed. Baseline: 130 Variability: moderate Accelerations: present Decelerations:none Category I  Labs: Lab Results  Component Value Date   WBC 8.6 05/10/2023   HGB 12.1 05/10/2023   HCT 37.1 05/10/2023   MCV 79.6 (L) 05/10/2023   PLT 177 05/10/2023    ASSESSMENT: 1) Augmentation of labor, progressing well     Coping: resting with epidural     Membranes: intact           2) GBS positive  3) A1GDM  Principal Problem:   Uterine contractions   PLAN: Pitocin initiated due to decreased contraction frequency and limited changed after receiving epidural. Continue pitocin titration, consider AROM with next exam. Continue penicillin for GBS prophylaxis Continue blood glucose checks, no insulin indicated Anticipate NSVD   [name], CNM 05/11/2023 6:02 AM

## 2023-05-11 NOTE — Anesthesia Preprocedure Evaluation (Addendum)
Anesthesia Evaluation  Patient identified by MRN, date of birth, ID band Patient awake    Reviewed: Allergy & Precautions, NPO status , Patient's Chart, lab work & pertinent test results  History of Anesthesia Complications Negative for: history of anesthetic complications  Airway Mallampati: II  TM Distance: >3 FB Neck ROM: full    Dental  (+) Poor Dentition   Pulmonary neg pulmonary ROS   Pulmonary exam normal        Cardiovascular negative cardio ROS Normal cardiovascular exam     Neuro/Psych  PSYCHIATRIC DISORDERS Anxiety Depression    negative neurological ROS     GI/Hepatic negative GI ROS, Neg liver ROS,,,  Endo/Other  diabetes, Gestational    Renal/GU      Musculoskeletal   Abdominal   Peds  Hematology negative hematology ROS (+)   Anesthesia Other Findings Past Medical History: No date: Abnormal breast exam     Comment:  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 No date: Allergic rhinitis No date: Hematuria No date: Medical history non-contributory  Past Surgical History: No date: VAGINAL DELIVERY No date: wisdom teeth pulled  BMI    Body Mass Index: 28.35 kg/m      Reproductive/Obstetrics negative OB ROS                             Anesthesia Physical Anesthesia Plan  ASA: 2  Anesthesia Plan: General ETT   Post-op Pain Management: Toradol IV (intra-op)* and Ofirmev IV (intra-op)*   Induction: Intravenous  PONV Risk Score and Plan: 3 and Ondansetron, Dexamethasone, Midazolam and Treatment may vary due to age or medical condition  Airway Management Planned: Oral ETT  Additional Equipment:   Intra-op Plan:   Post-operative Plan: Extubation in OR  Informed Consent: I have reviewed the patients History and Physical, chart, labs and discussed the procedure including the risks, benefits and alternatives for the  proposed anesthesia with the patient or authorized representative who has indicated his/her understanding and acceptance.     Dental Advisory Given  Plan Discussed with: Anesthesiologist, CRNA and Surgeon  Anesthesia Plan Comments: (Patient consented for risks of anesthesia including but not limited to:  - adverse reactions to medications - damage to eyes, teeth, lips or other oral mucosa - nerve damage due to positioning  - sore throat or hoarseness - Damage to heart, brain, nerves, lungs, other parts of body or loss of life  Patient voiced understanding.)        Anesthesia Quick Evaluation

## 2023-05-11 NOTE — Anesthesia Postprocedure Evaluation (Signed)
Anesthesia Post Note  Patient: [facility]  Procedure(s) Performed: POST PARTUM TUBAL LIGATION (Bilateral: Abdomen)  Patient location during evaluation: PACU Anesthesia Type: General Level of consciousness: awake and alert Pain management: pain level controlled Vital Signs Assessment: post-procedure vital signs reviewed and stable Respiratory status: spontaneous breathing, nonlabored ventilation, respiratory function stable and patient connected to nasal cannula oxygen Cardiovascular status: blood pressure returned to baseline and stable Postop Assessment: no apparent nausea or vomiting Anesthetic complications: no   No notable events documented.   Last Vitals:  Vitals:   05/11/23 1515 05/11/23 1555  BP: 136/77 (!) 156/87  Pulse: (!) 50 (!) 52  Resp: 15 14  Temp: (!) 36.1 C 36.6 C  SpO2: 97% 97%    Last Pain:  Vitals:   05/11/23 1555  TempSrc: Oral  PainSc: 5                  Louie Boston

## 2023-05-11 NOTE — Transfer of Care (Signed)
Immediate Anesthesia Transfer of Care Note  Patient: Marcia Hodges  Procedure(s) Performed: POST PARTUM TUBAL LIGATION (Bilateral: Abdomen)  Patient Location: PACU  Anesthesia Type:General  Level of Consciousness: drowsy and patient cooperative  Airway & Oxygen Therapy: Patient Spontanous Breathing and Patient connected to nasal cannula oxygen  Post-op Assessment: Report given to RN and Post -op Vital signs reviewed and stable  Post vital signs: Reviewed and stable  Last Vitals:  Vitals Value Taken Time  BP 122/73 05/11/23 1446  Temp    Pulse 72 05/11/23 1446  Resp 16 05/11/23 1446  SpO2 100 % 05/11/23 1446  Vitals shown include unfiled device data.  Last Pain:  Vitals:   05/11/23 1205  TempSrc: Oral  PainSc:       Patients Stated Pain Goal: 0 (05/11/23 1030)  Complications: No notable events documented.

## 2023-05-11 NOTE — Progress Notes (Signed)
Patient recently postpartum.  Doing well.  Has reaffirmed her desire for permanent sterilization by tubal ligation.  Risks and benefits reviewed permanent nature of sterilization reviewed. Use of Filshie clips for sterilization discussed. Abdominal examination reveals you +2 -fundus. Patient will remain n.p.o. and likely have surgery early this afternoon.

## 2023-05-11 NOTE — Discharge Summary (Signed)
Postpartum Discharge Summary  Date of Service updated***     Patient Name: Marcia Hodges DOB: [birth year] MRN: [id number]  Date of admission: 05/10/2023 Delivery date:05/11/2023 Delivering provider: Hartley Barefoot L Date of discharge: 05/11/2023  Admitting diagnosis: Uterine contractions [O47.9] Intrauterine pregnancy: [redacted]w[redacted]d     Secondary diagnosis:  Principal Problem:   Uterine contractions  Additional problems: A1GDM    Discharge diagnosis: Term Pregnancy Delivered and GDM A1                                              Post partum procedures:{Postpartum procedures:23558} Augmentation: Pitocin Complications: None  Hospital course: Onset of Labor With Vaginal Delivery      27 y.o. yo [id number] at [redacted]w[redacted]d was admitted in Latent Labor on 05/10/2023. Labor course was complicated by dysfunctional uterine contractions, pitocin for augmentation Membrane Rupture Time/Date: 6:18 AM,05/11/2023  Delivery Method:Vaginal, Spontaneous Operative Delivery:N/A Episiotomy: None Lacerations:  Labial Patient had a postpartum course complicated by ***.  She is ambulating, tolerating a regular diet, passing flatus, and urinating well. Patient is discharged home in stable condition on 05/11/23.  Newborn Data: Birth date:05/11/2023 Birth time:7:37 AM Gender:Female Living status:Living Apgars:8 ,9  Weight:   Magnesium Sulfate received: No BMZ received: No Rhophylac:N/A MMR:N/A T-DaP:Given prenatally Flu: {JYN:82956} RSV Vaccine received: {RSV:31013} Transfusion:{Transfusion received:30440034} Immunizations administered: Immunization History  Administered Date(s) Administered   DTaP 03/13/2000   HIB (PRP-OMP) 09/17/1995   HPV Quadrivalent 12/21/2010   Hepatitis B 05/13/1995   Influenza Nasal 06/23/2008   Influenza,inj,Quad PF,6+ Mos 04/27/2016, 07/07/2018, 04/17/2021, 05/30/2022   MMR 03/13/2000, 04/27/2016   Td 06/23/2008   Tdap 06/23/2000, 02/07/2016, 09/03/2018, 01/24/2021,  02/27/2023   Varicella 09/17/1995    Physical exam  Vitals:   05/11/23 0728 05/11/23 0730 05/11/23 0746 05/11/23 0815  BP:      Pulse:      Temp:      TempSrc:      SpO2: 97% 99% 99% 99%  Weight:      Height:       General: {Exam; general:21111117} Lochia: {Desc; appropriate/inappropriate:30686::"appropriate"} Uterine Fundus: {Desc; firm/soft:30687} Incision: {Exam; incision:21111123} DVT Evaluation: {Exam; dvt:2111122} Labs: Lab Results  Component Value Date   WBC 8.6 05/10/2023   HGB 12.1 05/10/2023   HCT 37.1 05/10/2023   MCV 79.6 (L) 05/10/2023   PLT 177 05/10/2023      Latest Ref Rng & Units 01/24/2023    3:40 PM  CMP  Glucose 70 - 99 mg/dL 89   BUN 6 - 20 mg/dL 6   Creatinine 2.13 - 0.86 mg/dL 5.78   Sodium 469 - 629 mmol/L 136   Potassium 3.5 - 5.1 mmol/L 2.9   Chloride 98 - 111 mmol/L 106   CO2 22 - 32 mmol/L 24   Calcium 8.9 - 10.3 mg/dL 8.5   Total Protein 6.5 - 8.1 g/dL 6.6   Total Bilirubin 0.3 - 1.2 mg/dL 0.3   Alkaline Phos 38 - 126 U/L 100   AST 15 - 41 U/L 11   ALT 0 - 44 U/L 11    Edinburgh Score:    06/12/2021   10:54 AM  Edinburgh Postnatal Depression Scale Screening Tool  I have been able to laugh and see the funny side of things. 0  I have looked forward with enjoyment to things. 0  I have blamed myself unnecessarily when  things went wrong. 0  I have been anxious or worried for no good reason. 0  I have felt scared or panicky for no good reason. 0  Things have been getting on top of me. 0  I have been so unhappy that I have had difficulty sleeping. 0  I have felt sad or miserable. 0  I have been so unhappy that I have been crying. 0  The thought of harming myself has occurred to me. 0  Edinburgh Postnatal Depression Scale Total 0      After visit meds:  Allergies as of 05/11/2023       Reactions   Femoxetine    Ferumoxytol Rash     Med Rec must be completed prior to using this [facility]***        Discharge home in  stable condition Infant Feeding: {Baby feeding:23562} Infant Disposition:{CHL IP OB HOME WITH OZHYQM:57846} Discharge instruction: per After Visit Summary and Postpartum booklet. Activity: Advance as tolerated. Pelvic rest for 6 weeks.  Diet: {OB diet:21111121} Anticipated Birth Control: {Birth Control:23956} Postpartum Appointment:{Outpatient follow up:23559} Additional Postpartum F/U: {PP Procedure:23957} Future Appointments: Future Appointments  Date Time Provider Department Center  05/15/2023  1:55 PM Mirna Mires, CNM [facility] None   Follow up Visit:      05/11/2023 [name], CNM

## 2023-05-11 NOTE — Interval H&P Note (Signed)
History and Physical Interval Note:  05/11/2023 12:54 PM  Marcia Hodges  has presented today for surgery, with the diagnosis of desires permanent sterilization.  The various methods of treatment have been discussed with the patient and family. After consideration of risks, benefits and other options for treatment, the patient has consented to  Procedure(s): POST PARTUM TUBAL LIGATION (Bilateral) as a surgical intervention.  The patient's history has been reviewed, patient examined, no change in status, stable for surgery.  I have reviewed the patient's chart and labs.  Questions were answered to the patient's satisfaction.   See other note too.  Brennan Bailey

## 2023-05-11 NOTE — Op Note (Signed)
     OPERATIVE NOTE 05/11/2023 2:32 PM  PRE-OPERATIVE DIAGNOSIS:  1) desires permanent sterilization  POST-OPERATIVE DIAGNOSIS:  1) Same  OPERATION:   POST PARTUM TUBAL LIGATION:   SURGEON(S): Surgeons and Role:    Linzie Collin, MD - Primary   ANESTHESIA: General  ESTIMATED BLOOD LOSS: 5mL  SPECIMEN: * No specimens in log *  COMPLICATIONS: None  DISPOSITION: Stable to recovery room  DESCRIPTION OF PROCEDURE:      The patient was prepped and draped in the supine position and placed under general anesthesia. The bladder was emptied.  A small infraumbilical incision was made and we carefully dissected our way through the fascia and peritoneum with blunt and sharp dissection until we were within the abdominal cavity. Small right-angle retractors were placed. An alexis retractor was placed.  The right fallopian tube was identified and followed out to its fine fimbriated end and then back approximate half the way. At this point the section of tube was elevated on a Babcock clamp and Filshie clip was used to completely occlude the tube in a perpendicular manner. The left fallopian tube was similarly identified and again the midportion of the tube is completely occluded using a Filshie clip in a perpendicular manner. Hemostasis of the fallopian tubes was noted. The incision was closed with a deep suture through the fascia of 0 Vicryl followed by subcuticular closure of the skin. A long-acting anesthetic was injected.  Derma bond was applied.  The patient went to the recovery room in stable condition.   Elonda Husky, M.D. 05/11/2023 2:32 PM

## 2023-05-11 NOTE — Lactation Note (Signed)
This note was copied from a baby's chart. Lactation Consultation Note  Patient Name: Marcia Hodges Date: 05/11/2023 Age:28 hours Reason for consult: Initial assessment;Early term 37-38.6wks;RN request;Mother's request;Other (Comment);Exclusive pumping and bottle feeding (Mother with congenital right breast formation abnormailty)   Maternal Data This is mom's 4th baby, SVD. Mom had BTL today. Mom with history of anxiety, depression,gestational diabetes, and congenital right breast formation abnormality.  Per mom she did try some breastfeeding in L&D and she has decided she prefer to pump and provide her breastmilk. She has a Public librarian breastpump at home.  Does the patient have breastfeeding experience prior to this delivery?: No (per mom she did not breastfeed or pump with her previous 3 children)  Feeding Mother's Current Feeding Choice: Breast Milk and Formula Nipple Type: Slow - flow   Lactation Tools Discussed/Used  Tools: Pump Breast pump type: Double-Electric Breast Pump Pump Education: Setup, frequency, and cleaning;Milk Storage Reason for Pumping: Mother desires to pump and provide milk for her baby. Pumping frequency: Recommended mom pump as often as the baby eats,  goal of 8 times/24 hours if she would like to attempt to maximize her milk production. Pumped volume:  (Mother post BTL resting now and will hold off pump initiation until she is feeling able to pump.)  Interventions Interventions: DEBP;Education   Discharge Pump: Personal (Per mom she has a Medela breastpump)  Consult Status Consult Status: Follow-up Date: 05/12/23 Follow-up type: In-patient  Update provided to care nurse.  Fuller Song 05/11/2023, 7:10 PM

## 2023-05-11 NOTE — Anesthesia Procedure Notes (Signed)
Procedure Name: Intubation Date/Time: 05/11/2023 1:57 PM  Performed by: Omer Jack, CRNAPre-anesthesia Checklist: Patient identified, Patient being monitored, Timeout performed, Emergency Drugs available and Suction available Patient Re-evaluated:Patient Re-evaluated prior to induction Oxygen Delivery Method: Circle system utilized Preoxygenation: Pre-oxygenation with 100% oxygen Induction Type: IV induction Ventilation: Mask ventilation without difficulty Laryngoscope Size: Mac and 3 Grade View: Grade I Tube type: Oral Tube size: 6.5 mm Number of attempts: 1 Airway Equipment and Method: Stylet Placement Confirmation: ETT inserted through vocal cords under direct vision, positive ETCO2 and breath sounds checked- equal and bilateral Secured at: 21 cm Tube secured with: Tape Dental Injury: Teeth and Oropharynx as per pre-operative assessment

## 2023-05-12 ENCOUNTER — Encounter: Payer: Self-pay | Admitting: Obstetrics and Gynecology

## 2023-05-12 LAB — CBC
HCT: 31.9 % — ABNORMAL LOW (ref 36.0–46.0)
Hemoglobin: 10.5 g/dL — ABNORMAL LOW (ref 12.0–15.0)
MCH: 26 pg (ref 26.0–34.0)
MCHC: 32.9 g/dL (ref 30.0–36.0)
MCV: 79 fL — ABNORMAL LOW (ref 80.0–100.0)
Platelets: 152 10*3/uL (ref 150–400)
RBC: 4.04 MIL/uL (ref 3.87–5.11)
RDW: 14.1 % (ref 11.5–15.5)
WBC: 12.8 10*3/uL — ABNORMAL HIGH (ref 4.0–10.5)
nRBC: 0 % (ref 0.0–0.2)

## 2023-05-12 LAB — GLUCOSE, CAPILLARY: Glucose-Capillary: 72 mg/dL (ref 70–99)

## 2023-05-12 MED ORDER — ACETAMINOPHEN 325 MG PO TABS: 650.0000 mg | ORAL_TABLET | ORAL | Status: DC | PRN

## 2023-05-12 MED ORDER — IBUPROFEN 600 MG PO TABS: 600.0000 mg | ORAL_TABLET | Freq: Four times a day (QID) | ORAL | Status: DC

## 2023-05-12 NOTE — Lactation Note (Signed)
This note was copied from a baby's chart. Lactation Consultation Note  Patient Name: Marcia Hodges Date: 05/12/2023 Age:28 hours Reason for consult: Follow-up assessment;Exclusive pumping and bottle feeding;Early term 37-38.6wks   Maternal Data Lactation to room for a follow up assessment.  Patient stated that she would like to start pumping.  Infant gone for a circumcision.   Feeding Mother's Current Feeding Choice: Breast Milk and Formula   Lactation Tools Discussed/Used Tools: Pump Breast pump type: Double-Electric Breast Pump Pump Education: Setup, frequency, and cleaning;Milk Storage Reason for Pumping: Mom's choice Pumping frequency: At least 8x w/in a 24hr period  [facility] started mom pumping w/ the DEBP.  Drops of EBM were seen that were fed back to infant once he returned.   LC measured patients left nipple at a 18mm flange, and rt nipple at 16mm flange.   Discharge Discharge Education: Engorgement and breast care;Outpatient recommendation WIC Program: No  Education on engorgement prevention/treatment was discussed as well as breastmilk storage guidelines.  LC will provide a milk storage handout when she returns. LC also discussed how to contact [facility] outpatient lactation services for support. Patient verbalized understanding.    Consult Status Consult Status: Complete Follow-up type: Call as needed    Yvette Rack Free 05/12/2023, 12:16 PM

## 2023-05-12 NOTE — Progress Notes (Signed)
Patient discharged home with family.  Discharge instructions, when to follow up, and prescriptions reviewed with patient.  Patient verbalized understanding. Patient will be escorted out by auxiliary.   

## 2023-05-12 NOTE — Anesthesia Postprocedure Evaluation (Signed)
Anesthesia Post Note  Patient: [facility]  Procedure(s) Performed: AN AD HOC LABOR EPIDURAL  Patient location during evaluation: Mother Baby Anesthesia Type: Epidural Level of consciousness: awake and alert and oriented Pain management: pain level controlled Vital Signs Assessment: post-procedure vital signs reviewed and stable Respiratory status: respiratory function stable Cardiovascular status: stable Postop Assessment: no headache, no backache, patient able to bend at knees, able to ambulate, adequate PO intake and no apparent nausea or vomiting Anesthetic complications: no   No notable events documented.   Last Vitals:  Vitals:   05/11/23 1937 05/11/23 2320  BP: 131/78 113/75  Pulse: 61 61  Resp: 18 18  Temp: 36.7 C 37.2 C  SpO2: 98% 98%    Last Pain:  Vitals:   05/11/23 2320  TempSrc: Oral  PainSc:                  Zachary George

## 2023-05-15 ENCOUNTER — Encounter: Payer: Medicaid Other | Admitting: Obstetrics

## 2023-06-05 ENCOUNTER — Telehealth (INDEPENDENT_AMBULATORY_CARE_PROVIDER_SITE_OTHER): Payer: Medicaid Other | Admitting: Certified Nurse Midwife

## 2023-06-05 DIAGNOSIS — Z131 Encounter for screening for diabetes mellitus: Secondary | ICD-10-CM

## 2023-06-05 DIAGNOSIS — Z1332 Encounter for screening for maternal depression: Secondary | ICD-10-CM | POA: Diagnosis not present

## 2023-06-05 DIAGNOSIS — Z1331 Encounter for screening for depression: Secondary | ICD-10-CM

## 2023-06-05 NOTE — Progress Notes (Signed)
   Virtual Visit via Video Note  I connected with Marcia Hodges on 06/06/23 at   3:35 PM EDT by a telephone and verified that I am speaking with the correct person using two identifiers.  Location: Patient: home Provider: [facility] office   I discussed the limitations of evaluation and management by telemedicine and the availability of in person appointments. The patient expressed understanding and agreed to proceed.    History of Present Illness:   Marcia Hodges is a 27 y.o. [phone] female who presents for a 2 week televisit for mood check. She is 2 weeks postpartum following a spontaneous vaginal delivery.  The delivery was at 38 gestational weeks. Postpartum course has been well so far. Baby is feeding by bottle. Bleeding: scant to light. Postpartum depression screening: negative.  EDPS score is 0.      The following portions of the patient's history were reviewed and updated as appropriate: allergies, current medications, past family history, past medical history, past social history, past surgical history, and problem list.   Observations/Objective:   not currently breastfeeding. Gen App: NAD Psych: normal speech, affect. Good mood.        06/05/2023    3:34 PM 05/12/2023    9:05 AM 06/12/2021   10:54 AM 05/08/2021    4:31 PM 04/20/2021    9:00 PM  Edinburgh Postnatal Depression Scale Screening Tool  I have been able to laugh and see the funny side of things. 0 0 0 0 0  I have looked forward with enjoyment to things. 0 0 0 0 0  I have blamed myself unnecessarily when things went wrong. 0 0 0 0 0  I have been anxious or worried for no good reason. 0 0 0 0 1  I have felt scared or panicky for no good reason. 0 0 0 0 0  Things have been getting on top of me. 0 0 0 0 0  I have been so unhappy that I have had difficulty sleeping. 0 0 0 0 0  I have felt sad or miserable. 0 0 0 0 0  I have been so unhappy that I have been crying. 0 0 0 0 0  The thought of harming myself  has occurred to me. 0 0 0 0 0  Edinburgh Postnatal Depression Scale Total 0 0 0 0 1         Assessment and Plan:   1. Encounter for screening for maternal depression - Screening Negative today. Will rescreen at 6 week postpartum visit.   - Continue zoloft at 25mg  daily, does not need refill at this time   2. Postpartum state - Overall doing well. Continue routine postpartum home care.    3. Contraception - BTL performed after delivery  4. Gestational diabetes - plan 2h GTT at 6w PP visit, orders placed.   Follow Up Instructions:     I discussed the assessment and treatment plan with the patient. The patient was provided an opportunity to ask questions and all were answered. The patient agreed with the plan and demonstrated an understanding of the instructions.   The patient was advised to call back or seek an in-person evaluation if the symptoms worsen or if the condition fails to improve as anticipated.   Marcia Hodges, CNM

## 2023-06-19 ENCOUNTER — Encounter: Payer: Self-pay | Admitting: Certified Nurse Midwife

## 2023-06-20 NOTE — Telephone Encounter (Signed)
Do you think this is normal and just scarred  tissue ?

## 2023-06-25 ENCOUNTER — Other Ambulatory Visit (HOSPITAL_COMMUNITY)
Admission: RE | Admit: 2023-06-25 | Discharge: 2023-06-25 | Disposition: A | Discharge status: Home / Self Care | Payer: Medicaid Other | Source: Ambulatory Visit | Attending: Certified Nurse Midwife | Admitting: Certified Nurse Midwife

## 2023-06-25 ENCOUNTER — Other Ambulatory Visit: Payer: Medicaid Other

## 2023-06-25 ENCOUNTER — Ambulatory Visit: Payer: Medicaid Other | Admitting: Certified Nurse Midwife

## 2023-06-25 ENCOUNTER — Encounter: Payer: Self-pay | Admitting: Certified Nurse Midwife

## 2023-06-25 DIAGNOSIS — Z131 Encounter for screening for diabetes mellitus: Secondary | ICD-10-CM

## 2023-06-25 DIAGNOSIS — N898 Other specified noninflammatory disorders of vagina: Secondary | ICD-10-CM | POA: Insufficient documentation

## 2023-06-25 DIAGNOSIS — Z1332 Encounter for screening for maternal depression: Secondary | ICD-10-CM

## 2023-06-25 DIAGNOSIS — O99345 Other mental disorders complicating the puerperium: Secondary | ICD-10-CM

## 2023-06-25 DIAGNOSIS — F32A Depression, unspecified: Secondary | ICD-10-CM

## 2023-06-25 MED ORDER — SERTRALINE HCL 25 MG PO TABS
25.0000 mg | ORAL_TABLET | Freq: Every day | ORAL | 1 refills | Status: DC
Start: 2023-06-25 — End: 2024-01-23

## 2023-06-25 NOTE — Progress Notes (Signed)
Post Partum Visit Note  Marcia Hodges is a 27 y.o. [phone] female who presents for a postpartum visit. She is 6 weeks postpartum following a normal spontaneous vaginal delivery.  I have fully reviewed the prenatal and intrapartum course. The delivery was at 38+6 gestational weeks.  Anesthesia: epidural. Postpartum course has been uncomplicated. Baby boy Marcia Hodges is doing well. Baby is feeding by  bottle, formula with good weight gain.  Bleeding no bleeding. Bowel function is normal. Bladder function is normal. Patient is sexually active. Contraception method is tubal ligation. Postpartum depression screening: negative. Reports increased vaginal discharge with odor some days white to brown at times, denies pain with IC.   The pregnancy intention screening data noted above was reviewed. Potential methods of contraception were discussed. The patient elected to proceed with Female Sterilization.   Edinburgh Postnatal Depression Scale - 06/25/23 1048       Edinburgh Postnatal Depression Scale:  In the Past 7 Days   I have been able to laugh and see the funny side of things. 0    I have looked forward with enjoyment to things. 0    I have blamed myself unnecessarily when things went wrong. 0    I have been anxious or worried for no good reason. 0    I have felt scared or panicky for no good reason. 0    Things have been getting on top of me. 0    I have been so unhappy that I have had difficulty sleeping. 0    I have felt sad or miserable. 0    I have been so unhappy that I have been crying. 0    The thought of harming myself has occurred to me. 0    Edinburgh Postnatal Depression Scale Total 0             Health Maintenance Due  Topic Date Due   HPV VACCINES (2 - 3-dose series) 01/18/2011   COVID-19 Vaccine (1 - 2023-24 season) Never done    The following portions of the patient's history were reviewed and updated as appropriate: allergies, current medications, past family  history, past medical history, past social history, past surgical history, and problem list.  Review of Systems Pertinent items are noted in HPI.  Objective:  BP 101/67   Pulse 77   Ht 5\' 2"  (1.575 m)   Wt 139 lb (63 kg)   Breastfeeding No   BMI 25.42 kg/m    General:  alert, cooperative, appears stated age, and no distress   Breasts:  normal  Lungs: clear to auscultation bilaterally  Heart:  regular rate and rhythm, S1, S2 normal, no murmur, click, rub or gallop  Abdomen: Soft, nontender, surgical site well healed to umbilicus, DR 1FB    Wound N/A  GU exam:  normal       Assessment:    1. Postpartum care following vaginal delivery -complete involution -  2. Encounter for screening for maternal depression -EPDS negative today -Continue current dose of Zoloft  3. Vaginal discharge -Aptima swab collected, likely physiologic discharge  4. Depression affecting pregnancy -Continue Zoloft   Normal postpartum exam.   Plan:   Essential components of care per ACOG recommendations:  1.  Mood and well being: Patient with negative depression screening today. Continue 25mg  zoloft daily.  - Patient tobacco use? No.   - hx of drug use? No.    2. Infant care and feeding:  -Patient currently breastmilk feeding? No.  -  Social determinants of health (SDOH) reviewed in EPIC. No concerns  3. Sexuality, contraception and birth spacing - Patient does not want a pregnancy in the next year.  Desired family size is 4 children.  - BTL completed postpartum  4. Sleep and fatigue -Encouraged family/partner/community support of 4 hrs of uninterrupted sleep to help with mood and fatigue  5. Physical Recovery  - Discussed patients delivery and complications. She describes her labor as good. - Patient had a Vaginal, no problems at delivery. Patient had a  labial  laceration. Perineal healing reviewed. Patient expressed understanding - Patient has urinary incontinence? No. - Patient is  safe to resume physical and sexual activity  6.  Health Maintenance - HM due items addressed Yes - Last pap smear  Diagnosis  Date Value Ref Range Status  10/12/2020   Final   - Negative for intraepithelial lesion or malignancy (NILM)   Pap smear not done at today's visit.  -Breast Cancer screening indicated? No.   7. Chronic Disease/Pregnancy Condition follow up: Gestational Diabetes, 2h GTT completed today.  - PCP follow up  [name], CNM [facility], [facility]

## 2023-06-26 LAB — GLUCOSE TOLERANCE, 2 HOURS
Glucose, 2 hour: 70 mg/dL (ref 70–139)
Glucose, GTT - Fasting: 90 mg/dL (ref 70–99)

## 2023-06-26 LAB — CERVICOVAGINAL ANCILLARY ONLY
Bacterial Vaginitis (gardnerella): NEGATIVE
Candida Glabrata: NEGATIVE
Candida Vaginitis: NEGATIVE
Chlamydia: NEGATIVE
Comment: NEGATIVE
Comment: NEGATIVE
Comment: NEGATIVE
Comment: NEGATIVE
Comment: NEGATIVE
Comment: NORMAL
Neisseria Gonorrhea: NEGATIVE
Trichomonas: NEGATIVE

## 2023-08-26 ENCOUNTER — Encounter: Payer: Self-pay | Admitting: Plastic Surgery

## 2023-08-26 ENCOUNTER — Ambulatory Visit: Payer: Medicaid Other | Admitting: Plastic Surgery

## 2023-08-26 VITALS — BP 124/87 | HR 80 | Wt 154.6 lb

## 2023-08-26 DIAGNOSIS — Q839 Congenital malformation of breast, unspecified: Secondary | ICD-10-CM

## 2023-08-26 NOTE — Progress Notes (Signed)
Patient ID: Marcia Hodges, female    DOB: 02-Jul-1996, 28 y.o.   MRN: [id number]   Chief Complaint  Patient presents with   Consult    The patient is a 28 year old female here with her spouse and baby.  She has 4 kids and had tubal ligation so not planning on having anymore kids.  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  She does not have any hand anomalies.  She does not grow hair in her right axilla.  Her left bra is about a C cup and her right bra is less than a.  She is hoping for symmetry.  We it does not look like she has a pectoralis muscle based on the exam on the right.  Her sternal notch to nipple distance is 17 cm on the right and 25 cm on the left.  She is 5 feet 2 inches tall and weighs 154 pounds.  She realizes that might be very difficult to get the right breast large enough to match the left so she is willing to go smaller on the left side.  The best symmetry possible is what she is hoping for.    Review of Systems  Constitutional: Negative.   HENT: Negative.    Eyes: Negative.   Respiratory: Negative.    Cardiovascular: Negative.   Gastrointestinal: Negative.   Endocrine: Negative.   Genitourinary: Negative.   Musculoskeletal: Negative.     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  HENT:     Head: Atraumatic.  Cardiovascular:     Rate and Rhythm: Normal rate.     Pulses: Normal pulses.  Pulmonary:     Effort: Pulmonary effort is normal.  Abdominal:     Palpations: Abdomen is soft.  Skin:    General: Skin is warm.     Capillary Refill: Capillary refill takes less than 2 seconds.  Neurological:     Mental Status: She is alert and oriented to person, place, and time.  Psychiatric:        Mood and Affect: Mood normal.        Behavior: Behavior normal.        Thought Content: Thought content normal.        Judgment: Judgment normal.     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  Likely silicone and likely above the muscle as she very well may not have a pectoralis muscle.  And then a mastopexy reduction to the left side for symmetry.  The patient is good to come  in and get measured with the implant sizers.  We wanted know what her matching implant size would be to the left and then as small as she is willing to go with an implant on the right.  Pictures were obtained of the patient and placed in the chart with the patient's or guardian's permission.   Alena Bills Seneca Gadbois, DO

## 2023-09-10 ENCOUNTER — Ambulatory Visit: Payer: Medicaid Other | Admitting: Student

## 2023-09-10 ENCOUNTER — Encounter: Payer: Self-pay | Admitting: Student

## 2023-09-10 VITALS — BP 124/81 | HR 75 | Ht 62.0 in | Wt 150.6 lb

## 2023-09-10 DIAGNOSIS — Q839 Congenital malformation of breast, unspecified: Secondary | ICD-10-CM

## 2023-09-10 DIAGNOSIS — Z719 Counseling, unspecified: Secondary | ICD-10-CM

## 2023-09-10 MED ORDER — ONDANSETRON HCL 4 MG PO TABS: 4.0000 mg | ORAL_TABLET | Freq: Three times a day (TID) | ORAL | 0 refills | Status: DC | PRN

## 2023-09-10 MED ORDER — OXYCODONE HCL 5 MG PO TABS
5.0000 mg | ORAL_TABLET | Freq: Four times a day (QID) | ORAL | 0 refills | Status: DC | PRN
Start: 2023-09-10 — End: 2023-11-06

## 2023-09-10 MED ORDER — CEPHALEXIN 500 MG PO CAPS: 500.0000 mg | ORAL_CAPSULE | Freq: Four times a day (QID) | ORAL | 0 refills | Status: AC

## 2023-09-10 NOTE — Progress Notes (Signed)
 Patient ID: Marcia Hodges, female    DOB: 01/18/1996, 28 y.o.   MRN: [id number]  Chief Complaint  Patient presents with   Pre-op Exam      ICD-10-CM   1. Congenital breast deformity  Q83.9        History of Present Illness: Marcia Hodges is a 28 y.o.  female  with a history of [place] syndrome.  She presents for preoperative evaluation for upcoming procedure, right breast augmentation of the left breast mastopexy, scheduled for 09/18/2023 with Dr. Ulice Bold.  The patient has not had problems with anesthesia.  Patient states that she has never needed a mammogram before.  She denies any personal family history of breast cancer.  She denies any history of cardiac disease.  She denies taking any blood thinners.  Patient reports she is not a smoker.  Patient denies taking any birth control or hormone replacement.  She denies any history of miscarriages.  She denies any personal family history of blood clots or clotting diseases.  She denies any recent surgeries, traumas, infections.  Patient denies any history of stroke or heart attack.  She denies any history of Crohn's disease or ulcerative colitis.  She denies any history of COPD or asthma.  She denies any history of cancer.  She denies any varicosities to her lower extremities.  She denies any recent fevers, chills or changes in her health.  Summary of Previous Visit: Patient was seen for consult by Dr. Ulice Bold on 08/26/2023.  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  Patient was requesting for surgical intervention for symmetry.  Patient was found to be a good candidate for right breast implant, likely silicone and above the muscle as she did not seem to have a pectoralis muscle.  Plan was for a mastopexy reduction to the left side for symmetry.  Job: Works as a IT sales professional at [employer], planning to take 2 weeks off.  Patient understands that she will have lifting restrictions  for 6 weeks postoperatively.  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   Allergic rhinitis    Hematuria    Medical history non-contributory     Past Surgical History: Past Surgical History:  Procedure Laterality Date   TUBAL LIGATION Bilateral 05/11/2023   Procedure: POST PARTUM TUBAL LIGATION;  Surgeon: Linzie Collin, MD;  Location: [facility] ORS;  Service: Gynecology;  Laterality: Bilateral;   VAGINAL DELIVERY     wisdom teeth pulled      Social History: Social History   Socioeconomic History   Marital status: Significant Other    Spouse name: Jimmy   Number of children: 3   Years of education: high school ged   Highest education level: GED or equivalent  Occupational History   Occupation: Statistician  Tobacco Use   Smoking status: Never   Smokeless tobacco: Never  Vaping Use   Vaping status: Former  Substance and Sexual Activity   Alcohol use: No    Alcohol/week: 0.0  standard drinks of alcohol   Drug use: No   Sexual activity: Yes    Partners: Male    Birth control/protection: Surgical    Comment: Tubal Ligation  Other Topics Concern   Not on file  Social History Narrative   Not on file   Social Drivers of Health   Financial Resource Strain: Low Risk  (10/11/2022)   Overall Financial Resource Strain (CARDIA)    Difficulty of Paying Living Expenses: Not hard at all  Food Insecurity: No Food Insecurity (10/11/2022)   Hunger Vital Sign    Worried About Running Out of Food in the Last Year: Never true    Ran  Out of Food in the Last Year: Never true  Transportation Needs: No Transportation Needs (10/11/2022)   PRAPARE - Administrator, Civil Service (Medical): No    Lack of Transportation (Non-Medical): No  Physical Activity: Sufficiently Active (10/11/2022)   Exercise Vital Sign    Days of Exercise per Week: 5 days    Minutes of Exercise per Session: 60 min  Stress: No Stress Concern Present (10/11/2022)   [employer] of Occupational Health - Occupational Stress Questionnaire    Feeling of Stress : Not at all  Social Connections: Moderately Isolated (10/11/2022)   Social Connection and Isolation Panel [NHANES]    Frequency of Communication with Friends and Family: Twice a week    Frequency of Social Gatherings with Friends and Family: Once a week    Attends Religious Services: Never    Database Administrator or Organizations: No    Attends Banker Meetings: Never    Marital Status: Living with partner  Intimate Partner Violence: Not At Risk (10/11/2022)   Humiliation, Afraid, Rape, and Kick questionnaire    Fear of Current or Ex-Partner: No    Emotionally Abused: No    Physically Abused: No    Sexually Abused: No    Family History: Family History  Problem Relation Age of Onset   Hypertension Father    Diabetes Neg Hx    Cancer Neg Hx    Hearing loss Neg Hx     Review of Systems: Denies any recent fevers, chills or changes in her health  Physical Exam: Vital Signs BP 124/81 (BP Location: Left Arm, Patient Position: Sitting, Cuff Size: Normal)   Pulse 75   Ht 5' 2 (1.575 m)   Wt 150 lb 9.6 oz (68.3 kg)   SpO2 100%   BMI 27.55 kg/m   Physical Exam  Constitutional:      General: Not in acute distress.    Appearance: Normal appearance. Not ill-appearing.  HENT:     Head: Normocephalic and atraumatic.  Neck:     Musculoskeletal: Normal range of motion.  Cardiovascular:     Rate and Rhythm: Normal rate Pulmonary:     Effort: Pulmonary effort is  normal. No respiratory distress.  Musculoskeletal: Normal range of motion.  Skin:    General: Skin is warm and dry.     Findings: No erythema or rash.  Neurological:     Mental Status: Alert and oriented to person, place, and time. Mental status is at baseline.  Psychiatric:        Mood and Affect: Mood normal.        Behavior: Behavior normal.    Assessment/Plan: The patient is scheduled for right breast augmentation and left breast mastopexy Dr. Lowery.  Risks, benefits, and alternatives of procedure discussed, questions answered and consent obtained.  Smoking Status: Non-smoker; Counseling Given?  N/A Last Mammogram: N/A due to age  Caprini Score: 3; Risk Factors include: BMI > 25, and length of planned surgery. Recommendation for mechanical prophylaxis. Encourage early ambulation.   Pictures obtained: @consult   Post-op Rx sent to pharmacy: Oxycodone , Zofran , Keflex   Patient had the opportunity to try different implant sizers today with Isaiah Saltness, RN. Per Isaiah, patient liked the 350 cc sizer the most.  After sizing with a different implant options, RN believes range will be from 250 cc to 375 cc.  Patient was provided with the [facility] breast implant registry checklist, [place] implant consent form, General Surgical Risk consent document and Pain Medication Agreement prior to their appointment.  They had adequate time to read through the risk consent documents and Pain Medication Agreement. We also discussed them in person together during this preop appointment. All of their questions were answered to their satisfaction.  Recommended calling if they have any further questions.  Risk consent form and Pain Medication Agreement to be scanned into patient's chart.  The risk that can be encountered with breast reduction were discussed and include the following but not limited to these:  Breast asymmetry, fluid accumulation, firmness of the breast, loss of nipple or areola,  skin loss, decrease or no nipple sensation, fat necrosis of the breast tissue, bleeding, infection, healing delay.  There are risks of anesthesia, changes to skin sensation and injury to nerves or blood vessels.  The muscle can be temporarily or permanently injured.  You may have an allergic reaction to tape, suture, glue, blood products which can result in skin discoloration, swelling, pain, skin lesions, poor healing.  Any of these can lead to the need for revisonal surgery or stage procedures.  A reduction has potential to interfere with diagnostic procedures.  Nipple or breast piercing can increase risks of infection.  This procedure is best done when the breast is fully developed.  Changes in the breast will continue to occur over time.  Pregnancy can alter the outcomes of previous breast reduction surgery, weight gain and weigh loss can also effect the long term appearance.    The consent was obtained with risks and complications reviewed which included bleeding, pain, scar, infection and the risk of anesthesia.  The patients questions were answered to the patients expressed satisfaction.   Electronically signed by: Estefana FORBES Peck, PA-C 09/10/2023 9:30 AM

## 2023-09-10 NOTE — H&P (View-Only) (Signed)
 Patient ID: Marcia Hodges, female    DOB: 08-30-1995, 28 y.o.   MRN: [id number]  Chief Complaint  Patient presents with   Pre-op Exam      ICD-10-CM   1. Congenital breast deformity  Q83.9        History of Present Illness: Marcia Hodges is a 28 y.o.  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  She presents for preoperative evaluation for upcoming procedure, right breast augmentation of the left breast mastopexy, scheduled for 09/18/2023 with Dr. Lowery.  The patient has not had problems with anesthesia.  Patient states that she has never needed a mammogram before.  She denies any personal family history of breast cancer.  She denies any history of cardiac disease.  She denies taking any blood thinners.  Patient reports she is not a smoker.  Patient denies taking any birth control or hormone replacement.  She denies any history of miscarriages.  She denies any personal family history of blood clots or clotting diseases.  She denies any recent surgeries, traumas, infections.  Patient denies any history of stroke or heart attack.  She denies any history of Crohn's disease or ulcerative colitis.  She denies any history of COPD or asthma.  She denies any history of cancer.  She denies any varicosities to her lower extremities.  She denies any recent fevers, chills or changes in her health.  Summary of Previous Visit: Patient was seen for consult by Dr. Lowery on 08/26/2023.  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  Patient was requesting for surgical intervention for symmetry.  Patient was found to be a good candidate for right breast implant, likely silicone and above the muscle as she did not seem to have a pectoralis muscle.  Plan was for a mastopexy reduction to the left side for symmetry.  Job: Works as a it sales professional at [employer], planning to take 2 weeks off.  Patient understands that she will have lifting restrictions  for 6 weeks postoperatively.  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   Allergic rhinitis    Hematuria    Medical history non-contributory     Past Surgical History: Past Surgical History:  Procedure Laterality Date   TUBAL LIGATION Bilateral 05/11/2023   Procedure: POST PARTUM TUBAL LIGATION;  Surgeon: Janit Alm Agent, MD;  Location: [facility] ORS;  Service: Gynecology;  Laterality: Bilateral;   VAGINAL DELIVERY     wisdom teeth pulled      Social History: Social History   Socioeconomic History   Marital status: Significant Other    Spouse name: Jimmy   Number of children: 3   Years of education: high school ged   Highest education level: GED or equivalent  Occupational History   Occupation: statistician  Tobacco Use   Smoking status: Never   Smokeless tobacco: Never  Vaping Use   Vaping status: Former  Substance and Sexual Activity   Alcohol use: No    Alcohol/week: 0.0  standard drinks of alcohol   Drug use: No   Sexual activity: Yes    Partners: Male    Birth control/protection: Surgical    Comment: Tubal Ligation  Other Topics Concern   Not on file  Social History Narrative   Not on file   Social Drivers of Health   Financial Resource Strain: Low Risk  (10/11/2022)   Overall Financial Resource Strain (CARDIA)    Difficulty of Paying Living Expenses: Not hard at all  Food Insecurity: No Food Insecurity (10/11/2022)   Hunger Vital Sign    Worried About Running Out of Food in the Last Year: Never true    Ran  Out of Food in the Last Year: Never true  Transportation Needs: No Transportation Needs (10/11/2022)   PRAPARE - Administrator, Civil Service (Medical): No    Lack of Transportation (Non-Medical): No  Physical Activity: Sufficiently Active (10/11/2022)   Exercise Vital Sign    Days of Exercise per Week: 5 days    Minutes of Exercise per Session: 60 min  Stress: No Stress Concern Present (10/11/2022)   [employer] of Occupational Health - Occupational Stress Questionnaire    Feeling of Stress : Not at all  Social Connections: Moderately Isolated (10/11/2022)   Social Connection and Isolation Panel [NHANES]    Frequency of Communication with Friends and Family: Twice a week    Frequency of Social Gatherings with Friends and Family: Once a week    Attends Religious Services: Never    Database Administrator or Organizations: No    Attends Banker Meetings: Never    Marital Status: Living with partner  Intimate Partner Violence: Not At Risk (10/11/2022)   Humiliation, Afraid, Rape, and Kick questionnaire    Fear of Current or Ex-Partner: No    Emotionally Abused: No    Physically Abused: No    Sexually Abused: No    Family History: Family History  Problem Relation Age of Onset   Hypertension Father    Diabetes Neg Hx    Cancer Neg Hx    Hearing loss Neg Hx     Review of Systems: Denies any recent fevers, chills or changes in her health  Physical Exam: Vital Signs BP 124/81 (BP Location: Left Arm, Patient Position: Sitting, Cuff Size: Normal)   Pulse 75   Ht 5' 2 (1.575 m)   Wt 150 lb 9.6 oz (68.3 kg)   SpO2 100%   BMI 27.55 kg/m   Physical Exam  Constitutional:      General: Not in acute distress.    Appearance: Normal appearance. Not ill-appearing.  HENT:     Head: Normocephalic and atraumatic.  Neck:     Musculoskeletal: Normal range of motion.  Cardiovascular:     Rate and Rhythm: Normal rate Pulmonary:     Effort: Pulmonary effort is  normal. No respiratory distress.  Musculoskeletal: Normal range of motion.  Skin:    General: Skin is warm and dry.     Findings: No erythema or rash.  Neurological:     Mental Status: Alert and oriented to person, place, and time. Mental status is at baseline.  Psychiatric:        Mood and Affect: Mood normal.        Behavior: Behavior normal.    Assessment/Plan: The patient is scheduled for right breast augmentation and left breast mastopexy Dr. Lowery.  Risks, benefits, and alternatives of procedure discussed, questions answered and consent obtained.  Smoking Status: Non-smoker; Counseling Given?  N/A Last Mammogram: N/A due to age  Caprini Score: 3; Risk Factors include: BMI > 25, and length of planned surgery. Recommendation for mechanical prophylaxis. Encourage early ambulation.   Pictures obtained: @consult   Post-op Rx sent to pharmacy: Oxycodone , Zofran , Keflex   Patient had the opportunity to try different implant sizers today with Isaiah Saltness, RN. Per Isaiah, patient liked the 350 cc sizer the most.  After sizing with a different implant options, RN believes range will be from 250 cc to 375 cc.  Patient was provided with the [facility] breast implant registry checklist, [place] implant consent form, General Surgical Risk consent document and Pain Medication Agreement prior to their appointment.  They had adequate time to read through the risk consent documents and Pain Medication Agreement. We also discussed them in person together during this preop appointment. All of their questions were answered to their satisfaction.  Recommended calling if they have any further questions.  Risk consent form and Pain Medication Agreement to be scanned into patient's chart.  The risk that can be encountered with breast reduction were discussed and include the following but not limited to these:  Breast asymmetry, fluid accumulation, firmness of the breast, loss of nipple or areola,  skin loss, decrease or no nipple sensation, fat necrosis of the breast tissue, bleeding, infection, healing delay.  There are risks of anesthesia, changes to skin sensation and injury to nerves or blood vessels.  The muscle can be temporarily or permanently injured.  You may have an allergic reaction to tape, suture, glue, blood products which can result in skin discoloration, swelling, pain, skin lesions, poor healing.  Any of these can lead to the need for revisonal surgery or stage procedures.  A reduction has potential to interfere with diagnostic procedures.  Nipple or breast piercing can increase risks of infection.  This procedure is best done when the breast is fully developed.  Changes in the breast will continue to occur over time.  Pregnancy can alter the outcomes of previous breast reduction surgery, weight gain and weigh loss can also effect the long term appearance.    The consent was obtained with risks and complications reviewed which included bleeding, pain, scar, infection and the risk of anesthesia.  The patients questions were answered to the patients expressed satisfaction.   Electronically signed by: Estefana FORBES Peck, PA-C 09/10/2023 9:30 AM

## 2023-09-15 NOTE — Progress Notes (Signed)
 Surgery orders requested via Epic inbox.

## 2023-09-16 NOTE — Patient Instructions (Signed)
SURGICAL WAITING ROOM VISITATION  Patients having surgery or a procedure may have no more than 2 support people in the waiting area - these visitors may rotate.    Children under the age of 21 must have an adult with them who is not the patient.  Due to an increase in RSV and influenza rates and associated hospitalizations, children ages 54 and under may not visit patients in [facility] hospitals.  Visitors with respiratory illnesses are discouraged from visiting and should remain at home.  If the patient needs to stay at the hospital during part of their recovery, the visitor guidelines for inpatient rooms apply. Pre-op nurse will coordinate an appropriate time for 1 support person to accompany patient in pre-op.  This support person may not rotate.    Please refer to the [facility] website for the visitor guidelines for Inpatients (after your surgery is over and you are in a regular room).    Your procedure is scheduled on: 09/18/23   Report to [facility] Main Entrance    Report to admitting at 10:15 AM   Call this number if you have problems the morning of surgery [phone]   Do not eat food or drink liquids :After Midnight.   After Midnight you may have the following liquids until ______ AM/ PM DAY OF SURGERY         If you have questions, please contact your surgeon's office.   FOLLOW BOWEL PREP AND ANY ADDITIONAL PRE OP INSTRUCTIONS YOU RECEIVED FROM YOUR SURGEON'S OFFICE!!!     Oral Hygiene is also important to reduce your risk of infection.                                    Remember - BRUSH YOUR TEETH THE MORNING OF SURGERY WITH YOUR REGULAR TOOTHPASTE  DENTURES WILL BE REMOVED PRIOR TO SURGERY PLEASE DO NOT APPLY "Poly grip" OR ADHESIVES!!!   Stop all vitamins and herbal supplements 7 days before surgery.   Take these medicines the morning of surgery with A SIP OF WATER: None                               You may not have any metal on your body  including hair pins, jewelry, and body piercing             Do not wear make-up, lotions, powders, perfumes, or deodorant  Do not wear nail polish including gel and S&S, artificial/acrylic nails, or any other type of covering on natural nails including finger and toenails. If you have artificial nails, gel coating, etc. that needs to be removed by a nail salon please have this removed prior to surgery or surgery may need to be canceled/ delayed if the surgeon/ anesthesia feels like they are unable to be safely monitored.   Do not shave  48 hours prior to surgery.   Do not bring valuables to the hospital. [facility] IS NOT             RESPONSIBLE   FOR VALUABLES.   Contacts, glasses, dentures or bridgework may not be worn into surgery.  DO NOT BRING YOUR HOME MEDICATIONS TO THE HOSPITAL. PHARMACY WILL DISPENSE MEDICATIONS LISTED ON YOUR MEDICATION LIST TO YOU DURING YOUR ADMISSION IN THE HOSPITAL!    Patients discharged on the day of surgery will not  be allowed to drive home.  Someone NEEDS to stay with you for the first 24 hours after anesthesia.              Please read over the following fact sheets you were given: IF YOU HAVE QUESTIONS ABOUT YOUR PRE-OP INSTRUCTIONS PLEASE CALL [name]    If you received a COVID test during your pre-op visit  it is requested that you wear a mask when out in public, stay away from anyone that may not be feeling well and notify your surgeon if you develop symptoms. If you test positive for Covid or have been in contact with anyone that has tested positive in the last 10 days please notify you surgeon.    [facility] - Preparing for Surgery Before surgery, you can play an important role.  Because skin is not sterile, your skin needs to be as free of germs as possible.  You can reduce the number of germs on your skin by washing with CHG (chlorahexidine gluconate) soap before surgery.  CHG is an antiseptic cleaner which kills germs and bonds with  the skin to continue killing germs even after washing. Please DO NOT use if you have an allergy to CHG or antibacterial soaps.  If your skin becomes reddened/irritated stop using the CHG and inform your nurse when you arrive at Short Stay. Do not shave (including legs and underarms) for at least 48 hours prior to the first CHG shower.  You may shave your face/neck.  Please follow these instructions carefully:  1.  Shower with CHG Soap the night before surgery and the  morning of surgery.  2.  If you choose to wash your hair, wash your hair first as usual with your normal  shampoo.  3.  After you shampoo, rinse your hair and body thoroughly to remove the shampoo.                             4.  Use CHG as you would any other liquid soap.  You can apply chg directly to the skin and wash.  Gently with a scrungie or clean washcloth.  5.  Apply the CHG Soap to your body ONLY FROM THE NECK DOWN.   Do   not use on face/ open                           Wound or open sores. Avoid contact with eyes, ears mouth and   genitals (private parts).                       Wash face,  Genitals (private parts) with your normal soap.             6.  Wash thoroughly, paying special attention to the area where your    surgery  will be performed.  7.  Thoroughly rinse your body with warm water from the neck down.  8.  DO NOT shower/wash with your normal soap after using and rinsing off the CHG Soap.                9.  Pat yourself dry with a clean towel.            10.  Wear clean pajamas.            11.  Place clean sheets on your bed the night  of your first shower and do not  sleep with pets. Day of Surgery : Do not apply any lotions/deodorants the morning of surgery.  Please wear clean clothes to the hospital/surgery center.  FAILURE TO FOLLOW THESE INSTRUCTIONS MAY RESULT IN THE CANCELLATION OF YOUR SURGERY  PATIENT SIGNATURE_________________________________  NURSE  SIGNATURE__________________________________  ________________________________________________________________________

## 2023-09-16 NOTE — Progress Notes (Signed)
COVID Vaccine Completed:  Date of COVID positive in last 90 days:  PCP - Meryl Crutch, NP Cardiologist - Lorine Bears, MD LOV 10/15/18 for palpitations  Chest x-ray -  EKG -  Stress Test -  ECHO - 11/16/18 Epic Cardiac Cath -  Pacemaker/ICD device last checked: Spinal Cord Stimulator:  Bowel Prep -   Sleep Study -  CPAP -   Fasting Blood Sugar -  Checks Blood Sugar _____ times a day  Last dose of GLP1 agonist-  N/A GLP1 instructions:  Hold 7 days before surgery    Last dose of SGLT-2 inhibitors-  N/A SGLT-2 instructions:  Hold 3 days before surgery    Blood Thinner Instructions:  Last dose:   Time: Aspirin Instructions: Last Dose:  Activity level:  Can go up a flight of stairs and perform activities of daily living without stopping and without symptoms of chest pain or shortness of breath.  Able to exercise without symptoms  Unable to go up a flight of stairs without symptoms of     Anesthesia review:   Patient denies shortness of breath, fever, cough and chest pain at PAT appointment  Patient verbalized understanding of instructions that were given to them at the PAT appointment. Patient was also instructed that they will need to review over the PAT instructions again at home before surgery.

## 2023-09-17 ENCOUNTER — Encounter (HOSPITAL_COMMUNITY)
Admission: RE | Admit: 2023-09-17 | Discharge: 2023-09-17 | Disposition: A | Discharge status: Home / Self Care | Payer: Medicaid Other | Source: Ambulatory Visit | Attending: Plastic Surgery | Admitting: Plastic Surgery

## 2023-09-17 ENCOUNTER — Encounter (HOSPITAL_COMMUNITY): Payer: Self-pay

## 2023-09-17 ENCOUNTER — Other Ambulatory Visit: Payer: Self-pay

## 2023-09-17 VITALS — BP 114/74 | HR 70 | Temp 98.1°F | Resp 14 | Ht 62.0 in | Wt 147.0 lb

## 2023-09-17 DIAGNOSIS — Z01818 Encounter for other preprocedural examination: Secondary | ICD-10-CM

## 2023-09-17 DIAGNOSIS — Z01812 Encounter for preprocedural laboratory examination: Secondary | ICD-10-CM | POA: Insufficient documentation

## 2023-09-17 HISTORY — DX: Anxiety disorder, unspecified: F41.9

## 2023-09-17 HISTORY — DX: Depression, unspecified: F32.A

## 2023-09-17 LAB — CBC
HCT: 40.9 % (ref 36.0–46.0)
Hemoglobin: 13.2 g/dL (ref 12.0–15.0)
MCH: 28.1 pg (ref 26.0–34.0)
MCHC: 32.3 g/dL (ref 30.0–36.0)
MCV: 87.2 fL (ref 80.0–100.0)
Platelets: 354 10*3/uL (ref 150–400)
RBC: 4.69 MIL/uL (ref 3.87–5.11)
RDW: 13.3 % (ref 11.5–15.5)
WBC: 7 10*3/uL (ref 4.0–10.5)
nRBC: 0 % (ref 0.0–0.2)

## 2023-09-18 ENCOUNTER — Ambulatory Visit (HOSPITAL_COMMUNITY): Payer: Self-pay | Admitting: Anesthesiology

## 2023-09-18 ENCOUNTER — Encounter (HOSPITAL_COMMUNITY): Payer: Self-pay | Admitting: Plastic Surgery

## 2023-09-18 ENCOUNTER — Encounter (HOSPITAL_COMMUNITY)
Admission: RE | Disposition: A | Discharge status: Home / Self Care | Payer: Self-pay | Source: Home / Self Care | Attending: Plastic Surgery

## 2023-09-18 ENCOUNTER — Other Ambulatory Visit: Payer: Self-pay

## 2023-09-18 ENCOUNTER — Ambulatory Visit (HOSPITAL_COMMUNITY)
Admission: RE | Admit: 2023-09-18 | Discharge: 2023-09-18 | Disposition: A | Discharge status: Home / Self Care | Payer: Medicaid Other | Attending: Plastic Surgery | Admitting: Plastic Surgery

## 2023-09-18 ENCOUNTER — Ambulatory Visit (HOSPITAL_BASED_OUTPATIENT_CLINIC_OR_DEPARTMENT_OTHER): Payer: Self-pay | Admitting: Anesthesiology

## 2023-09-18 DIAGNOSIS — Q839 Congenital malformation of breast, unspecified: Secondary | ICD-10-CM

## 2023-09-18 DIAGNOSIS — F419 Anxiety disorder, unspecified: Secondary | ICD-10-CM | POA: Insufficient documentation

## 2023-09-18 DIAGNOSIS — N6489 Other specified disorders of breast: Secondary | ICD-10-CM

## 2023-09-18 DIAGNOSIS — Q838 Other congenital malformations of breast: Secondary | ICD-10-CM | POA: Insufficient documentation

## 2023-09-18 DIAGNOSIS — F32A Depression, unspecified: Secondary | ICD-10-CM | POA: Insufficient documentation

## 2023-09-18 DIAGNOSIS — Q798 Other congenital malformations of musculoskeletal system: Secondary | ICD-10-CM | POA: Insufficient documentation

## 2023-09-18 HISTORY — PX: MASTOPEXY: SHX5358

## 2023-09-18 HISTORY — PX: BREAST ENHANCEMENT SURGERY: SHX7

## 2023-09-18 LAB — HCG, SERUM, QUALITATIVE: Preg, Serum: NEGATIVE

## 2023-09-18 SURGERY — AUGMENTATION, BREAST
Anesthesia: General | Site: Breast | Laterality: Right

## 2023-09-18 MED ORDER — ROCURONIUM BROMIDE 100 MG/10ML IV SOLN
INTRAVENOUS | Status: DC | PRN
  Administered 2023-09-18: 50 mg via INTRAVENOUS

## 2023-09-18 MED ORDER — AMISULPRIDE (ANTIEMETIC) 5 MG/2ML IV SOLN: 10.0000 mg | Freq: Once | INTRAVENOUS | Status: DC | PRN

## 2023-09-18 MED ORDER — BUPIVACAINE HCL 0.25 % IJ SOLN
INTRAMUSCULAR | Status: AC
  Filled 2023-09-18: qty 1

## 2023-09-18 MED ORDER — LIDOCAINE-EPINEPHRINE 1 %-1:100000 IJ SOLN
INTRAMUSCULAR | Status: AC
Start: 2023-09-18 — End: ?
  Filled 2023-09-18: qty 1

## 2023-09-18 MED ORDER — LACTATED RINGERS IV SOLN: INTRAVENOUS | Status: DC

## 2023-09-18 MED ORDER — FENTANYL CITRATE (PF) 100 MCG/2ML IJ SOLN
INTRAMUSCULAR | Status: DC | PRN
  Administered 2023-09-18 (×3): 50 ug via INTRAVENOUS
  Administered 2023-09-18: 100 ug via INTRAVENOUS

## 2023-09-18 MED ORDER — MIDAZOLAM HCL 2 MG/2ML IJ SOLN
INTRAMUSCULAR | Status: AC
  Filled 2023-09-18: qty 2

## 2023-09-18 MED ORDER — ORAL CARE MOUTH RINSE: 15.0000 mL | Freq: Once | OROMUCOSAL | Status: AC

## 2023-09-18 MED ORDER — PROPOFOL 10 MG/ML IV BOLUS
INTRAVENOUS | Status: AC
  Filled 2023-09-18: qty 20

## 2023-09-18 MED ORDER — MIDAZOLAM HCL 5 MG/5ML IJ SOLN
INTRAMUSCULAR | Status: DC | PRN
  Administered 2023-09-18: 2 mg via INTRAVENOUS

## 2023-09-18 MED ORDER — SODIUM CHLORIDE 0.9% FLUSH: 3.0000 mL | INTRAVENOUS | Status: DC | PRN

## 2023-09-18 MED ORDER — CHLORHEXIDINE GLUCONATE 0.12 % MT SOLN
15.0000 mL | Freq: Once | OROMUCOSAL | Status: AC
  Administered 2023-09-18: 15 mL via OROMUCOSAL

## 2023-09-18 MED ORDER — ONDANSETRON HCL 4 MG/2ML IJ SOLN
INTRAMUSCULAR | Status: AC
  Filled 2023-09-18: qty 2

## 2023-09-18 MED ORDER — OXYCODONE HCL 5 MG/5ML PO SOLN: 5.0000 mg | Freq: Once | ORAL | Status: DC | PRN

## 2023-09-18 MED ORDER — LIDOCAINE-EPINEPHRINE 1 %-1:100000 IJ SOLN
INTRAMUSCULAR | Status: DC | PRN
  Administered 2023-09-18: 20 mL via INTRAMUSCULAR

## 2023-09-18 MED ORDER — LIDOCAINE HCL (CARDIAC) PF 100 MG/5ML IV SOSY
PREFILLED_SYRINGE | INTRAVENOUS | Status: DC | PRN
  Administered 2023-09-18: 80 mg via INTRAVENOUS

## 2023-09-18 MED ORDER — CHLORHEXIDINE GLUCONATE CLOTH 2 % EX PADS: 6.0000 | MEDICATED_PAD | Freq: Once | CUTANEOUS | Status: DC

## 2023-09-18 MED ORDER — SUGAMMADEX SODIUM 200 MG/2ML IV SOLN
INTRAVENOUS | Status: DC | PRN
  Administered 2023-09-18: 200 mg via INTRAVENOUS

## 2023-09-18 MED ORDER — LACTATED RINGERS IV SOLN: INTRAVENOUS | Status: DC | PRN

## 2023-09-18 MED ORDER — ACETAMINOPHEN 325 MG PO TABS: 650.0000 mg | ORAL_TABLET | ORAL | Status: DC | PRN

## 2023-09-18 MED ORDER — DEXMEDETOMIDINE HCL IN NACL 80 MCG/20ML IV SOLN
INTRAVENOUS | Status: DC | PRN
  Administered 2023-09-18: 8 ug via INTRAVENOUS
  Administered 2023-09-18: 12 ug via INTRAVENOUS

## 2023-09-18 MED ORDER — DEXAMETHASONE SODIUM PHOSPHATE 4 MG/ML IJ SOLN
INTRAMUSCULAR | Status: DC | PRN
  Administered 2023-09-18: 5 mg via INTRAVENOUS

## 2023-09-18 MED ORDER — CEFAZOLIN SODIUM-DEXTROSE 2-4 GM/100ML-% IV SOLN
2.0000 g | INTRAVENOUS | Status: AC
  Administered 2023-09-18: 2 g via INTRAVENOUS
  Filled 2023-09-18: qty 100

## 2023-09-18 MED ORDER — ACETAMINOPHEN 10 MG/ML IV SOLN
INTRAVENOUS | Status: AC
  Filled 2023-09-18: qty 100

## 2023-09-18 MED ORDER — VASHE WOUND IRRIGATION OPTIME
TOPICAL | Status: DC | PRN
  Administered 2023-09-18: 34 [oz_av]

## 2023-09-18 MED ORDER — ONDANSETRON HCL 4 MG/2ML IJ SOLN
INTRAMUSCULAR | Status: DC | PRN
  Administered 2023-09-18: 4 mg via INTRAVENOUS

## 2023-09-18 MED ORDER — FENTANYL CITRATE (PF) 250 MCG/5ML IJ SOLN
INTRAMUSCULAR | Status: AC
  Filled 2023-09-18: qty 5

## 2023-09-18 MED ORDER — DEXAMETHASONE SODIUM PHOSPHATE 10 MG/ML IJ SOLN
INTRAMUSCULAR | Status: AC
  Filled 2023-09-18: qty 1

## 2023-09-18 MED ORDER — FENTANYL CITRATE PF 50 MCG/ML IJ SOSY: 25.0000 ug | PREFILLED_SYRINGE | INTRAMUSCULAR | Status: DC | PRN

## 2023-09-18 MED ORDER — PROPOFOL 10 MG/ML IV BOLUS
INTRAVENOUS | Status: DC | PRN
  Administered 2023-09-18: 150 mg via INTRAVENOUS

## 2023-09-18 MED ORDER — OXYCODONE HCL 5 MG PO TABS: 5.0000 mg | ORAL_TABLET | Freq: Once | ORAL | Status: DC | PRN

## 2023-09-18 MED ORDER — OXYCODONE HCL 5 MG PO TABS: 5.0000 mg | ORAL_TABLET | ORAL | Status: DC | PRN

## 2023-09-18 MED ORDER — ACETAMINOPHEN 650 MG RE SUPP: 650.0000 mg | RECTAL | Status: DC | PRN

## 2023-09-18 MED ORDER — SODIUM CHLORIDE 0.9 % IV SOLN: 250.0000 mL | INTRAVENOUS | Status: DC | PRN

## 2023-09-18 MED ORDER — ACETAMINOPHEN 10 MG/ML IV SOLN
INTRAVENOUS | Status: DC | PRN
  Administered 2023-09-18: 1000 mg via INTRAVENOUS

## 2023-09-18 MED ORDER — SODIUM CHLORIDE 0.9% FLUSH
3.0000 mL | Freq: Two times a day (BID) | INTRAVENOUS | Status: DC
Start: 2023-09-18 — End: 2023-09-18

## 2023-09-18 MED ORDER — BUPIVACAINE LIPOSOME 1.3 % IJ SUSP
INTRAMUSCULAR | Status: AC
  Filled 2023-09-18: qty 20

## 2023-09-18 MED ORDER — BUPIVACAINE LIPOSOME 1.3 % IJ SUSP
INTRAMUSCULAR | Status: DC | PRN
  Administered 2023-09-18: 20 mL

## 2023-09-18 SURGICAL SUPPLY — 70 items
BAG DECANTER FOR FLEXI CONT (MISCELLANEOUS) ×2 IMPLANT
BINDER ABDOMINAL 12 ML 46-62 (SOFTGOODS) IMPLANT
BINDER BREAST LRG (GAUZE/BANDAGES/DRESSINGS) IMPLANT
BINDER BREAST MEDIUM (GAUZE/BANDAGES/DRESSINGS) IMPLANT
BINDER BREAST XLRG (GAUZE/BANDAGES/DRESSINGS) IMPLANT
BINDER BREAST XXLRG (GAUZE/BANDAGES/DRESSINGS) IMPLANT
BIOPATCH RED 1 DISK 7.0 (GAUZE/BANDAGES/DRESSINGS) IMPLANT
BLADE HEX COATED 2.75 (ELECTRODE) ×2 IMPLANT
BLADE SURG 15 STRL LF DISP TIS (BLADE) ×2 IMPLANT
BNDG GAUZE DERMACEA FLUFF 4 (GAUZE/BANDAGES/DRESSINGS) ×4 IMPLANT
COLLAGEN CELLERATERX 5 GRAM (Miscellaneous) IMPLANT
CORD BIPOLAR FORCEPS 12FT (ELECTRODE) IMPLANT
COVER BACK TABLE 60X90IN (DRAPES) ×2 IMPLANT
COVER MAYO STAND STRL (DRAPES) ×2 IMPLANT
DERMABOND ADVANCED .7 DNX12 (GAUZE/BANDAGES/DRESSINGS) IMPLANT
DRAIN CHANNEL 19F RND (DRAIN) IMPLANT
DRAPE LAPAROSCOPIC ABDOMINAL (DRAPES) ×2 IMPLANT
DRSG MEPILEX POST OP 4X8 (GAUZE/BANDAGES/DRESSINGS) IMPLANT
DRSG TEGADERM 2-3/8X2-3/4 SM (GAUZE/BANDAGES/DRESSINGS) IMPLANT
ELECT BLADE 4.0 EZ CLEAN MEGAD (MISCELLANEOUS) ×2
ELECT BLADE 6.5 EXT (BLADE) IMPLANT
ELECT REM PT RETURN 9FT ADLT (ELECTROSURGICAL) ×2
ELECTRODE BLDE 4.0 EZ CLN MEGD (MISCELLANEOUS) ×2 IMPLANT
ELECTRODE REM PT RTRN 9FT ADLT (ELECTROSURGICAL) ×2 IMPLANT
EVACUATOR SILICONE 100CC (DRAIN) IMPLANT
FUNNEL KELLER 2 DISP (MISCELLANEOUS) IMPLANT
GAUZE PAD ABD 8X10 STRL (GAUZE/BANDAGES/DRESSINGS) ×4 IMPLANT
GAUZE SPONGE 4X4 12PLY STRL (GAUZE/BANDAGES/DRESSINGS) IMPLANT
GAUZE SPONGE 4X4 12PLY STRL LF (GAUZE/BANDAGES/DRESSINGS) IMPLANT
GLOVE BIO SURGEON STRL SZ 6.5 (GLOVE) ×4 IMPLANT
GOWN STRL REUS W/ TWL LRG LVL3 (GOWN DISPOSABLE) ×4 IMPLANT
IMPL GEL 275CC SMOOTH HIGH (Breast) IMPLANT
IMPLANT GEL 275CC SMOOTH HIGH (Breast) ×2 IMPLANT
IV NS 1000ML BAXH (IV SOLUTION) IMPLANT
IV NS 500ML BAXH (IV SOLUTION) ×2 IMPLANT
KIT BASIN OR (CUSTOM PROCEDURE TRAY) ×2 IMPLANT
KIT FILL ASEPTIC TRANSFER (MISCELLANEOUS) IMPLANT
NDL HYPO 25X1 1.5 SAFETY (NEEDLE) ×2 IMPLANT
NEEDLE HYPO 25X1 1.5 SAFETY (NEEDLE) ×2 IMPLANT
NS IRRIG 1000ML POUR BTL (IV SOLUTION) ×2 IMPLANT
PACK GENERAL/GYN (CUSTOM PROCEDURE TRAY) ×2 IMPLANT
PENCIL SMOKE EVACUATOR (MISCELLANEOUS) ×2 IMPLANT
PIN SAFETY STERILE (MISCELLANEOUS) IMPLANT
SIZER BREAST GEL REUSE 300CC (SIZER) ×2
SIZER BREAST GEL REUSE 325CC (SIZER) ×2
SIZER BRST GEL REUSE 300CC (SIZER) IMPLANT
SIZER BRST GEL REUSE 325CC (SIZER) IMPLANT
SLEEVE SCD COMPRESS KNEE MED (STOCKING) ×2 IMPLANT
SPIKE FLUID TRANSFER (MISCELLANEOUS) IMPLANT
SPONGE T-LAP 18X18 ~~LOC~~+RFID (SPONGE) ×4 IMPLANT
STRIP CLOSURE SKIN 1/2X4 (GAUZE/BANDAGES/DRESSINGS) ×2 IMPLANT
STRIP SUTURE WOUND CLOSURE 1/2 (MISCELLANEOUS) IMPLANT
SUT MNCRL AB 4-0 PS2 18 (SUTURE) IMPLANT
SUT MON AB 3-0 SH27 (SUTURE) IMPLANT
SUT MON AB 5-0 PS2 18 (SUTURE) ×2 IMPLANT
SUT PDS 3-0 CT2 (SUTURE) ×4
SUT PDS AB 2-0 CT2 27 (SUTURE) IMPLANT
SUT PDS II 3-0 CT2 27 ABS (SUTURE) IMPLANT
SUT SILK 3 0 PS 1 (SUTURE) IMPLANT
SUT VIC AB 3-0 SH 27X BRD (SUTURE) ×2 IMPLANT
SUT VIC AB 4-0 PS2 18 (SUTURE) ×2 IMPLANT
SYR 50ML LL SCALE MARK (SYRINGE) IMPLANT
SYR BULB IRRIG 60ML STRL (SYRINGE) ×2 IMPLANT
SYR CONTROL 10ML LL (SYRINGE) ×2 IMPLANT
TAPE MEASURE VINYL STERILE (MISCELLANEOUS) ×2 IMPLANT
TOWEL GREEN STERILE FF (TOWEL DISPOSABLE) ×4 IMPLANT
TRAY PREMIUM WET SKIN SCRUB (MISCELLANEOUS) ×2 IMPLANT
TUBE CONNECTING 20X1/4 (TUBING) ×2 IMPLANT
UNDERPAD 30X36 HEAVY ABSORB (UNDERPADS AND DIAPERS) ×4 IMPLANT
YANKAUER SUCT BULB TIP NO VENT (SUCTIONS) ×2 IMPLANT

## 2023-09-18 NOTE — Transfer of Care (Signed)
Immediate Anesthesia Transfer of Care Note  Patient: Marcia Hodges  Procedure(s) Performed: MAMMOPLASTY AUGMENTATION (BREAST) (Right: Breast) MASTOPEXY (Left: Breast)  Patient Location: PACU  Anesthesia Type:General  Level of Consciousness: awake and alert   Airway & Oxygen Therapy: Patient Spontanous Breathing and Patient connected to face mask oxygen  Post-op Assessment: Report given to RN and Post -op Vital signs reviewed and stable  Post vital signs: Reviewed and stable  Last Vitals:  Vitals Value Taken Time  BP 131/82 09/18/23 1453  Temp 36.6 C 09/18/23 1453  Pulse 97 09/18/23 1455  Resp 15 09/18/23 1455  SpO2 98 % 09/18/23 1455  Vitals shown include unfiled device data.  Last Pain:  Vitals:   09/18/23 1044  PainSc: 0-No pain         Complications: No notable events documented.

## 2023-09-18 NOTE — Interval H&P Note (Signed)
History and Physical Interval Note:  09/18/2023 12:10 PM  [facility]  has presented today for surgery, with the diagnosis of Congenital breast deformity.  The various methods of treatment have been discussed with the patient and family. After consideration of risks, benefits and other options for treatment, the patient has consented to  Procedure(s): MAMMOPLASTY AUGMENTATION (BREAST) (Right) MASTOPEXY (Left) as a surgical intervention.  The patient's history has been reviewed, patient examined, no change in status, stable for surgery.  I have reviewed the patient's chart and labs.  Questions were answered to the patient's satisfaction.     Alena Bills Montrae Braithwaite

## 2023-09-18 NOTE — Anesthesia Preprocedure Evaluation (Addendum)
Anesthesia Evaluation  Patient identified by MRN, date of birth, ID band Patient awake    Reviewed: Allergy & Precautions, NPO status , Patient's Chart, lab work & pertinent test results  History of Anesthesia Complications Negative for: history of anesthetic complications  Airway Mallampati: III  TM Distance: >3 FB Neck ROM: Full   Comment: Previous grade I view with MAC 3, easy mask Dental  (+) Dental Advisory Given   Pulmonary neg pulmonary ROS   Pulmonary exam normal breath sounds clear to auscultation       Cardiovascular negative cardio ROS  Rhythm:Regular Rate:Normal  TTE 11/17/2018: IMPRESSIONS    1. The left ventricle has low normal systolic function, with an ejection  fraction of 50-55%. The cavity size was mildly dilated. Left ventricular  diastolic parameters were normal.   2. The right ventricle has normal systolic function. The cavity was  normal. There is no increase in right ventricular wall thickness. Right  ventricular systolic pressure could not be assessed.   3. Trivial pericardial effusion is present.   4. No evidence of mitral valve stenosis.   5. The aortic valve is tricuspid.   6. The aortic root and ascending aorta are normal in size and structure.   7. The interatrial septum was not well visualized.     Neuro/Psych  PSYCHIATRIC DISORDERS Anxiety Depression    negative neurological ROS     GI/Hepatic negative GI ROS, Neg liver ROS,,,  Endo/Other  negative endocrine ROS    Renal/GU negative Renal ROS     Musculoskeletal   Abdominal   Peds  Hematology negative hematology ROS (+) Lab Results      Component                Value               Date                      WBC                      7.0                 09/17/2023                HGB                      13.2                09/17/2023                HCT                      40.9                09/17/2023                MCV                       87.2                09/17/2023                PLT                      354                 09/17/2023  Anesthesia Other Findings   Reproductive/Obstetrics                              Anesthesia Physical Anesthesia Plan  ASA: 1  Anesthesia Plan: General   Post-op Pain Management: Ofirmev IV (intra-op)*   Induction: Intravenous  PONV Risk Score and Plan: 3 and Ondansetron, Dexamethasone and Treatment may vary due to age or medical condition  Airway Management Planned: Oral ETT  Additional Equipment:   Intra-op Plan:   Post-operative Plan: Extubation in OR  Informed Consent: I have reviewed the patients History and Physical, chart, labs and discussed the procedure including the risks, benefits and alternatives for the proposed anesthesia with the patient or authorized representative who has indicated his/her understanding and acceptance.     Dental advisory given  Plan Discussed with: CRNA and Anesthesiologist  Anesthesia Plan Comments: (Risks of general anesthesia discussed including, but not limited to, sore throat, hoarse voice, chipped/damaged teeth, injury to vocal cords, nausea and vomiting, allergic reactions, lung infection, heart attack, stroke, and death. All questions answered. )         Anesthesia Quick Evaluation

## 2023-09-18 NOTE — Op Note (Signed)
Breast Reduction Op note:    DATE OF PROCEDURE: 09/18/2023  LOCATION: [facility]  SURGEON: Foster Simpson, DO  ASSISTANT: Caroline More, PA  PREOPERATIVE DIAGNOSIS 1. 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 Breast asymmetry  POSTOPERATIVE DIAGNOSIS Same as preoperative diagnosis  PROCEDURES 1. Left breast reduction 100 g 2. Reconstruction of right breast with contracture release 100 cm2 and placement of implant  COMPLICATIONS: None  IMPLANTS: Mentor High Profile 274 cc gel implant.  DRAINS: none  INDICATIONS FOR PROCEDURE Marcia Hodges is a 28 y.o. 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   MRN: [id number]  CONSENT Informed consent was obtained directly from the patient. The risks, benefits and alternatives were fully discussed. Specific risks including but not limited to bleeding, infection, hematoma, seroma, scarring, pain, nipple necrosis, asymmetry, poor cosmetic results, and need for further surgery were discussed. The patient's questions were answered.  DESCRIPTION OF PROCEDURE  Patient was brought into the operating room and rested on the operating room table in the supine position.  SCDs were placed and appropriate padding was performed.  Antibiotics were given. The patient underwent general anesthesia and the chest was prepped and draped in a sterile fashion.  A timeout was performed and all information was confirmed to be correct by those in the room.  Right side: Local was injected at the inframammary fold.  The #10 blade was used to make a 3 cm incision at the inframammary fold. The bovie was used to dissect to the chest wall and identify muscle.   From what I could see, there was a pectoralis minor.  I did not visualize a pectoralis major.  The soft tissue was dissected off the muscle and there was 100 cm of contracture that made the pocket very tight.   I tried the 325 cc and 300 cc sizers and they were too tight.  The pocket was irrigated with Vashe and hemostasis confirmed. The 275 cc implant was utilized.  Experel was placed in the pocket. The deep tissues were approximated with 3-0 PDS sutures.  The skin was closed with deep dermal 3-0 Monocryl and subcuticular 4-0 Monocryl sutures.    Left side: Preoperative markings were confirmed.  Incision lines were injected with local containing epinephrine.  After waiting for vasoconstriction, the marked lines were incised with a #15 blade.  A Wise-pattern superomedial breast reduction was performed by de-epithelializing the pedicle, using bovie to create the superomedial pedicle, and removing breast tissue from the lateral and inferior portions of the breast.  Care was taken to not undermine the breast pedicle. Celerate and Experel were placed in the pocket. Hemostasis was achieved.  The nipple was gently rotated into position and the soft tissue was closed with 4-0 Monocryl.  The patient was sat upright and size and shape symmetry was confirmed.  The pocket was irrigated and hemostasis confirmed.  The deep tissues were approximated with 3-0 PDS sutures. The skin was closed with deep dermal 3-0 Monocryl and subcuticular 4-0 Monocryl sutures.  Dermabond was applied.  A breast binder and ABDs were placed.  The nipple and skin flap had good capillary refill at the end of the procedure.  The patient tolerated the procedure well. The patient was allowed to wake from anesthesia and taken to the recovery room in satisfactory condition.  The advanced practice practitioner (APP) assisted throughout the case.  The APP was essential in retraction and counter traction when needed to  make the case progress smoothly.  This retraction and assistance made it possible to see the tissue plans for the procedure.  The assistance was needed for blood control, tissue re-approximation and assisted with closure of the incision site.

## 2023-09-18 NOTE — Anesthesia Procedure Notes (Signed)
Procedure Name: Intubation Date/Time: 09/18/2023 1:00 PM  Performed by: Deri Fuelling, CRNAPre-anesthesia Checklist: Patient identified, Emergency Drugs available, Suction available and Patient being monitored Patient Re-evaluated:Patient Re-evaluated prior to induction Oxygen Delivery Method: Circle system utilized Preoxygenation: Pre-oxygenation with 100% oxygen Induction Type: IV induction Ventilation: Mask ventilation without difficulty Laryngoscope Size: Glidescope and 3 Grade View: Grade I Tube type: Oral Tube size: 7.0 mm Number of attempts: 1 Airway Equipment and Method: Stylet and Oral airway Placement Confirmation: ETT inserted through vocal cords under direct vision, positive ETCO2 and breath sounds checked- equal and bilateral Secured at: 21 cm Tube secured with: Tape Dental Injury: Teeth and Oropharynx as per pre-operative assessment

## 2023-09-18 NOTE — Anesthesia Postprocedure Evaluation (Signed)
Anesthesia Post Note  Patient: [facility]  Procedure(s) Performed: MAMMOPLASTY AUGMENTATION (BREAST) (Right: Breast) MASTOPEXY (Left: Breast)     Patient location during evaluation: PACU Anesthesia Type: General Level of consciousness: awake Pain management: pain level controlled Vital Signs Assessment: post-procedure vital signs reviewed and stable Respiratory status: spontaneous breathing, nonlabored ventilation and respiratory function stable Cardiovascular status: blood pressure returned to baseline and stable Postop Assessment: no apparent nausea or vomiting Anesthetic complications: no   No notable events documented.  Last Vitals:  Vitals:   09/18/23 1545 09/18/23 1600  BP: 117/77 (!) 108/91  Pulse: 70 69  Resp: 13 15  Temp:  (!) 36.4 C  SpO2: 96% 97%    Last Pain:  Vitals:   09/18/23 1600  PainSc: 0-No pain                 Linton Rump

## 2023-09-19 ENCOUNTER — Ambulatory Visit (INDEPENDENT_AMBULATORY_CARE_PROVIDER_SITE_OTHER): Payer: Medicaid Other | Admitting: Physician Assistant

## 2023-09-19 ENCOUNTER — Ambulatory Visit: Payer: Medicaid Other | Admitting: Nurse Practitioner

## 2023-09-19 ENCOUNTER — Encounter (HOSPITAL_COMMUNITY): Payer: Self-pay | Admitting: Plastic Surgery

## 2023-09-19 DIAGNOSIS — Q839 Congenital malformation of breast, unspecified: Secondary | ICD-10-CM

## 2023-09-19 NOTE — Progress Notes (Signed)
Patient is a 28 year old female with history of [place] syndrome s/p right breast augmentation and left breast reduction/mastopexy for symmetry performed 09/18/2023 by Dr. Ulice Bold who joins via telephone for postoperative day 1 check-in.  Reviewed operative report and a 275 cc gel implant was placed on the right and 100 g reduction of the left.  Today, patient is doing quite well.  She states that she has been tolerating her discomfort with Tylenol alone has not required any oxycodone.  She is tolerating p.o. intake without difficulty.  Voiding and ambulatory.  She has been wearing her compressive garment 24/7.  She denies any issues or concerns.  Specifically, denies any leg swelling, chest discomfort, difficulty breathing, or fevers.  Recommending continued activity modifications and compressive garments.  She understands to call the office should she have any questions or concerns over the weekend.  Otherwise, plan for in person assessment next week as scheduled.

## 2023-09-22 LAB — SURGICAL PATHOLOGY

## 2023-09-24 ENCOUNTER — Encounter: Payer: Self-pay | Admitting: Student

## 2023-09-24 ENCOUNTER — Ambulatory Visit (INDEPENDENT_AMBULATORY_CARE_PROVIDER_SITE_OTHER): Payer: Medicaid Other | Admitting: Student

## 2023-09-24 VITALS — BP 125/77 | HR 78

## 2023-09-24 DIAGNOSIS — Q839 Congenital malformation of breast, unspecified: Secondary | ICD-10-CM

## 2023-09-24 NOTE — Progress Notes (Signed)
Patient is a 28 year old female with history of [place] syndrome.  She recently underwent left breast reduction and reconstruction of the right breast with contracture release and placement of an implant on 09/18/2023.  Patient is 6 days out from her procedure.  Patient had a Mentor smooth round high profile 275 cc gel implant placed.  She presents to the clinic today for postoperative follow-up.  Today, patient reports with her mother at bedside.  She states that overall she is doing well.  She states that her pain has been controlled with Tylenol and ibuprofen.  She denies any fevers or chills.  Reports that she has been eating and drinking without issue, denies any nausea or vomiting.  She does state that she is at this moment not happy with her outcome as there is still some asymmetry.  She denies any other issues or complaints at this time.  Present on exam.  On exam, patient is sitting upright in no acute distress.  Left breast is soft.  There is some mild ecchymosis noted near the NAC.  There are no obvious fluid collections on exam.  No overlying erythema is noted.  Incision appears to be intact with Steri-Strips.  Left NAC appears slightly dusky at the periphery.  No wounds noted on exam.  The right breast implant is in place.  It is soft, the right side is a little bit swollen now.  NAC appears to be healthy on the right side.  There is no overlying erythema.  No obvious fluid collections on exam.  Incision appeared to be intact with a Steri-Strip.  There was a single droplet of serous drainage coming from the incision at the time of exam.  Steri-Strip was removed.  Incision overall appears to be intact, but there was a little bit of irritation to the incision.  Upon further palpation of the incision, no more drainage was expressed from the incision.  There are no signs of infection on exam.  Mepilex border dressing was placed over the right breast incision.  Discussed with her she can leave this in  place unless it gets wet underneath the dressing.  Discussed with her that if it does, she should remove the dressing and apply an outer dressing such as gauze or nonadherent pad over the incision.  Patient expressed understanding.  Discussed with the patient that the periphery of her nipple was looking a little bit dusky.  I recommended that she closely monitor this and look for any worsening of darkness.  I recommended also that she apply a small amount of Vaseline around the nipple daily.  Patient expressed understanding.  I discussed with the patient that she should give her breasts a little bit more time to settle out.  I did discuss with her though that I would like her to talk to Dr. Ulice Bold about some of the areas she is bothered by she discussed possible further interventions down the road after she has healed.  I did discuss with her the possibility of NAC tattooing later on to help match her left NAC as well.  Patient expressed understanding and was in agreement with this plan.  I instructed the patient to continue her compression at all times and avoid strenuous activities.  I would like the patient to come back next week for reevaluation.  I instructed her to call us in the meantime if she has any questions or concerns about anything.  Pictures were obtained of the patient and placed in the chart with  the patient's or guardian's permission.

## 2023-10-01 ENCOUNTER — Ambulatory Visit (INDEPENDENT_AMBULATORY_CARE_PROVIDER_SITE_OTHER): Payer: Medicaid Other | Admitting: Student

## 2023-10-01 VITALS — BP 109/75 | HR 77

## 2023-10-01 DIAGNOSIS — Q839 Congenital malformation of breast, unspecified: Secondary | ICD-10-CM

## 2023-10-01 NOTE — Progress Notes (Signed)
 Patient is a 28 year old female with history of [place] syndrome.  She recently underwent left breast reduction and reconstruction of the right breast with contracture release and placement of an implant on 09/18/2023.  She had a Mentor smooth round high-profile 275 cc gel implant placed.  Patient is almost 2 weeks postop.  She presents to the clinic today for postoperative follow-up.     Patient was last seen in the clinic on 09/24/2023.  At this visit, patient was doing well.  On exam, the left NAC appeared to be slightly dusky at the periphery.  The right breast implant was in place, it was soft but a little bit swollen.  There were no obvious fluid collections on exam.  Today, patient reports she is doing well.  She states that she feels her nipple appears to have improved since previous visit.  She states that she still has a little bit of soreness, especially on the sides.  She denies any fevers or chills.  She denies any significant pain.  She denies any drainage.  Chaperone present on exam.  On exam, patient is sitting upright in no acute distress.  Right breast implant is in place and is soft.  There are no obvious fluid collections.  No overlying erythema or ecchymosis.  Right NAC appears to be healthy.  There is a little bit of superficial sloughing/scabbing noted to the medial aspect of the right breast incision.  Incision otherwise appears intact.  Left breast is soft.  There is no overlying erythema.  No fluid collections palpated on exam.  Left NAC appears to be approximately the same as most recent visit, does not appear to be worsening in darkness.  Incision appears to be intact with Steri-Strips.  There are no signs of infection on exam.  Recommended that patient apply Vaseline to her NAC and to her right breast incision daily.  Recommended that she cover the right breast incision with a Mepilex border dressing or with a nonstick gauze.  Patient expressed understanding.  Discussed with  patient to continue compression and avoid vigorous activities.  Recommended that she follow up at her scheduled visit with Dr. Ulice Bold next week.  Instructed her to call if she has any questions or concerns about anything.

## 2023-10-02 ENCOUNTER — Encounter: Payer: Self-pay | Admitting: Plastic Surgery

## 2023-10-07 ENCOUNTER — Encounter: Payer: Self-pay | Admitting: Plastic Surgery

## 2023-10-07 ENCOUNTER — Ambulatory Visit: Payer: Medicaid Other | Admitting: Plastic Surgery

## 2023-10-07 VITALS — BP 110/75 | HR 86

## 2023-10-07 DIAGNOSIS — Q839 Congenital malformation of breast, unspecified: Secondary | ICD-10-CM

## 2023-10-07 NOTE — Progress Notes (Signed)
   Subjective:    Patient ID: Marcia Hodges, female    DOB: [birth year], 28 y.o.   MRN: [id number]  The patient is a 28 year old female here for follow-up after undergoing surgery on February 13.  The patient had a left breast reduction and a right breast contracture release with implant placement.  This was for [place]'s deformity.  The patient is interested in fat grafting if that is a possibility.      Review of Systems  Constitutional: Negative.   Eyes: Negative.   Respiratory: Negative.    Cardiovascular: Negative.   Endocrine: Negative.   Genitourinary: Negative.        Objective:   Physical Exam Constitutional:      Appearance: Normal appearance.  Cardiovascular:     Rate and Rhythm: Normal rate.     Pulses: Normal pulses.  Musculoskeletal:        General: Swelling present.  Skin:    General: Skin is warm.     Capillary Refill: Capillary refill takes less than 2 seconds.     Coloration: Skin is not jaundiced.     Findings: Bruising present.  Neurological:     Mental Status: She is oriented to person, place, and time.  Psychiatric:        Mood and Affect: Mood normal.        Behavior: Behavior normal.        Thought Content: Thought content normal.        Judgment: Judgment normal.        Assessment & Plan:     ICD-10-CM   1. Congenital breast deformity  Q83.9        Will take at least 6 months for everything to settle down so we know if the skin is going to soften up enough for fat grafting.  I think it is a good option if we are able to.  She has a follow-up in a week and a half and then I will plan to see her back in 6 months.    Pictures were obtained of the patient and placed in the chart with the patient's or guardian's permission.

## 2023-10-21 ENCOUNTER — Ambulatory Visit: Payer: Medicaid Other | Admitting: Student

## 2023-10-21 ENCOUNTER — Encounter: Payer: Self-pay | Admitting: Student

## 2023-10-21 VITALS — BP 111/75 | HR 78 | Ht 62.0 in | Wt 145.0 lb

## 2023-10-21 DIAGNOSIS — Q839 Congenital malformation of breast, unspecified: Secondary | ICD-10-CM

## 2023-10-21 NOTE — Progress Notes (Signed)
 Patient is a 28 year old female with history of [place] syndrome.  She recently underwent left breast reduction and reconstruction of the right breast with contracture release and placement of an implant on 09/18/2023.  She had a Mentor smooth round high-profile 275 cc gel implant placed.  Patient is about 4 and half weeks postop.  She presents to the clinic today for postoperative follow-up.         Patient was last seen in the clinic on 10/07/2023.  The patient stated that she was interested in fat grafting if it were a possibility.  It was recommended to her that she wait at least 6 months for everything to settle down.  Today, patient reports she is doing well.  She states that she still has a little bit of soreness from time to time, but overall pain is controlled.  She states she does not have to take any medications for pain.  Patient states that the swelling to the right breast has gone down.  She denies any other issues or concerns.  She denies any drainage, fevers or chills.  Chaperone present on exam.  On exam, patient is sitting upright in no acute distress.  Right breast implant is placed.  It is soft.  There is no overlying erythema.  No obvious fluid collections on exam.  Right NAC appears to be healthy.  Incision appears to be intact with Steri-Strips.  Very strips were removed.  Incision appears to be intact and overall healing well.  There was a suture that was cut and removed.  Patient tolerated well.  To the left breast, left NAC appears to be healthy.  There is no overlying erythema.  It is soft and there are no fluid collections palpated on exam.  Incision is intact with Steri-Strips.  Steri-Strips were removed.  Incision appears to be intact and healing well.  There are no signs of infection on exam.  Recommended that patient apply Vaseline to her incisions daily and gently massage the inferior aspect of her left vertical limb incision.  Also recommended that she continue compression  for another few weeks and to avoid strenuous activities.  Patient expressed understanding.  Patient to follow back up in 2 weeks.  She will then have an appointment with Dr. Ulice Bold in 6 months to discuss further interventions such as fat grafting.  Instructed the patient to call if she has any questions or concerns in the meantime.  Pictures were obtained of the patient and placed in the chart with the patient's or guardian's permission.

## 2023-11-06 ENCOUNTER — Encounter: Payer: Self-pay | Admitting: Student

## 2023-11-06 ENCOUNTER — Encounter: Payer: Self-pay | Admitting: Plastic Surgery

## 2023-11-06 ENCOUNTER — Ambulatory Visit: Admitting: Student

## 2023-11-06 VITALS — BP 111/75 | HR 75

## 2023-11-06 DIAGNOSIS — Q839 Congenital malformation of breast, unspecified: Secondary | ICD-10-CM

## 2023-11-06 NOTE — Progress Notes (Signed)
 Patient is a 28 year old female with history of [place] syndrome.  She recently underwent left breast reduction and reconstruction of the right breast with contracture release and placement of an implant on 09/18/2023.  She had a Mentor smooth round high-profile 275 cc gel implant placed. Patient is 7 weeks postop. She presents to the clinic today for postoperative follow up.              Patient was last seen in the clinic on 10/21/2023.  At this visit, patient reported she was doing well.  On exam, right breast implant was in place.  It was soft.  NAC appeared to be healthy.  Incision was intact and overall healing well.  Left NAC was healthy.  Plan is for patient to follow-up with Dr. Ulice Bold in 6 months to discuss further interventions.  Today, patient reports she is doing well.  She denies any new issues or concerns.  She denies any fevers, chills or drainage.  Chaperone present on exam.  On exam, patient is sitting upright in no acute distress.  Right breast implant is placed.  It is soft.  NAC appears to be healthy.  Incision overall appears to be healing well.  Left breast is soft.  There is no overlying erythema.  No fluid collections palpated on exam.  Overall incisions appear to be healing well.  There is a little bit of irritation noted to the inferior lateral aspect of the left NAC.  Left NAC appears to be healthy.  There are no signs of infection on exam.  Recommended that patient continue to apply Vaseline to the area of irritation to her left NAC.  Discussed with her that otherwise, she may start applying scar creams to her incisions daily.  Also recommended that she continue to massage her left breast.  Patient expressed understanding.  Discussed with the patient that she no longer has to wear compression bra and that she may start gradually increasing her activities.  Patient's breast understanding.  Patient to follow back up in about 6 months to discuss further interventions with  Dr. Ulice Bold.  I instructed her to call in the meantime she has any questions or concerns about anything.

## 2024-01-23 ENCOUNTER — Ambulatory Visit (INDEPENDENT_AMBULATORY_CARE_PROVIDER_SITE_OTHER): Admitting: Family Medicine

## 2024-01-23 ENCOUNTER — Encounter: Payer: Self-pay | Admitting: Family Medicine

## 2024-01-23 VITALS — BP 122/80 | HR 74 | Temp 98.1°F | Ht 62.0 in | Wt 154.0 lb

## 2024-01-23 DIAGNOSIS — Z7689 Persons encountering health services in other specified circumstances: Secondary | ICD-10-CM | POA: Diagnosis not present

## 2024-01-23 DIAGNOSIS — F411 Generalized anxiety disorder: Secondary | ICD-10-CM | POA: Diagnosis not present

## 2024-01-23 DIAGNOSIS — F330 Major depressive disorder, recurrent, mild: Secondary | ICD-10-CM | POA: Diagnosis not present

## 2024-01-23 MED ORDER — SERTRALINE HCL 25 MG PO TABS: ORAL_TABLET | ORAL | 0 refills | Status: AC

## 2024-01-23 NOTE — Patient Instructions (Signed)
 MyChart:  For all urgent or time sensitive needs we ask that you please call the office to avoid delays. Our number is [phone]) [phone]. MyChart is not constantly monitored and due to the large volume of messages a day, replies may take up to 72 business hours.   MyChart Policy: MyChart allows for you to see your visit notes, after visit summary, provider recommendations, lab and tests results, make an appointment, request refills, and contact your provider or the office for non-urgent questions or concerns. Providers are seeing patients during normal business hours and do not have built in time to review MyChart messages.  We ask that you allow a minimum of 3 business days for responses to [employer]. For this reason, please do not send urgent requests through MyChart. Please call the office at [phone]. New and ongoing conditions may require a visit. We have virtual and in person visit available for your convenience.  Complex MyChart concerns may require a visit. Your provider may request you schedule a virtual or in person visit to ensure we are providing the best care possible. MyChart messages sent after 11:00 AM on Friday will not be received by the provider until Monday morning.    Lab and Test Results: You will receive your lab and test results on MyChart as soon as they are completed and results have been sent by the lab or testing facility. Due to this service, you will receive your results BEFORE your provider.  I review lab and tests results each morning prior to seeing patients. Some results require collaboration with other providers to ensure you are receiving the most appropriate care. For this reason, we ask that you please allow a minimum of 3-5 business days from the time the ALL results have been received for your provider to receive and review lab and test results and contact you about these.  Most lab and test result comments from the provider will be sent through MyChart.  Your provider may recommend changes to the plan of care, follow-up visits, repeat testing, ask questions, or request an office visit to discuss these results. You may reply directly to this message or call the office at [phone] to provide information for the provider or set up an appointment. In some instances, you will be called with test results and recommendations. Please let us know if this is preferred and we will make note of this in your chart to provide this for you.    If you have not heard a response to your lab or test results in 5 business days from all results returning to MyChart, please call the office to let us know. We ask that you please avoid calling prior to this time unless there is an emergent concern. Due to high call volumes, this can delay the resulting process.   After Hours: For all non-emergency after hours needs, please call the office at [phone] and select the option to reach the on-call provider service. On-call services are shared between multiple [facility] offices and therefore it will not be possible to speak directly with your provider. On-call providers may provide medical advice and recommendations, but are unable to provide refills for maintenance medications.  For all emergency or urgent medical needs after normal business hours, we recommend that you seek care at the closest Urgent Care or Emergency Department to ensure appropriate treatment in a timely manner.  [facility] has a 24 hour emergency room located on the ground floor for your  convenience.    Urgent Concerns During the Business Day Providers are seeing patients from 8AM to 5PM, Monday through Thursday, and 8AM to 12PM on Friday with a busy schedule and are most often not able to respond to non-urgent calls until the end of the day or the next business day. If you should have URGENT concerns during the day, please call and speak to the nurse or schedule a same day  appointment so that we can address your concern without delay.    Thank you, again, for choosing me as your health care partner. I appreciate your trust and look forward to learning more about you.    Marcia Reedy, FNP-C

## 2024-01-23 NOTE — Progress Notes (Signed)
 New Patient Office Visit  Subjective   Patient ID: Marcia Hodges, female    DOB: 11/27/1995  Age: 28 y.o. MRN: [id number]  CC:  Chief Complaint  Patient presents with   New Patient (Initial Visit)    HPI [facility] is a pleasant 28 year old female who presents to establish with [facility] at [facility].   CC: Patient here to establish care  Last PCP: none  Specialists: OB/GYN  MOOD: anxiety/depression  OBGYN prescribed them and she has not seen since her 6-week f/u  She has very bad postpartum anxiety/depression with her previous 3 children  During her last pregnancy, she was on Zoloft  throughout her pregnancy  At first, zoloft  was helpful but she noticed it started to wear off Did not have any adverse side effects      01/23/2024    9:30 AM 06/12/2021   10:54 AM 04/17/2021   12:36 PM 10/12/2020   11:52 AM  GAD 7 : Generalized Anxiety Score  Nervous, Anxious, on Edge 2 0 1 0  Control/stop worrying 2 0 1 1  Worry too much - different things 2 0 1 1  Trouble relaxing 2 0 1 0  Restless 2 0 1 0  Easily annoyed or irritable 3 0 1 1  Afraid - awful might happen 0 0 0 0  Total GAD 7 Score 13 0 6 3  Anxiety Difficulty Somewhat difficult  Somewhat difficult        01/23/2024    9:30 AM 02/26/2023    4:14 PM 10/11/2022   10:36 AM  PHQ9 SCORE ONLY  PHQ-9 Total Score 10 0 0    Outpatient Encounter Medications as of 01/23/2024  Medication Sig   sertraline  (ZOLOFT ) 25 MG tablet Take 1 tablet (25mg  total) by mouth daily for 14 days. After, take 2 tablets (50mg  total) by mouth daily.   [DISCONTINUED] sertraline  (ZOLOFT ) 25 MG tablet Take 1 tablet (25 mg total) by mouth daily. (Patient not taking: Reported on 01/23/2024)   No facility-administered encounter medications on file as of 01/23/2024.    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   Uterine contractions 05/10/2023   Gestational diabetes  02/21/2023   Impaired glucose in pregnancy, antepartum 02/16/2023   Pregnancy 01/24/2023   Anxiety and depression 12/17/2022   Supervision of other normal pregnancy, antepartum 01/28/2021   Past Medical History:  Diagnosis Date   Abnormal breast exam    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   Allergic rhinitis    Allergy    Anxiety    Depression    Hematuria    Medical history non-contributory    Past Surgical History:  Procedure Laterality Date   BREAST ENHANCEMENT SURGERY Right 09/18/2023   Procedure: MAMMOPLASTY AUGMENTATION (BREAST);  Surgeon: Thornell Flirt, DO;  Location: [facility];  Service: Plastics;  Laterality: Right;   BREAST SURGERY  09/18/23   COSMETIC SURGERY  09/18/23   MASTOPEXY Left 09/18/2023   Procedure: MASTOPEXY;  Surgeon: Thornell Flirt, DO;  Location: [facility];  Service: Plastics;  Laterality: Left;   TUBAL LIGATION Bilateral 05/11/2023   Procedure: POST PARTUM TUBAL LIGATION;  Surgeon: Zenobia Hila, MD;  Location: [facility];  Service: Gynecology;  Laterality: Bilateral;   VAGINAL DELIVERY     wisdom teeth pulled     Family History  Problem Relation Age of Onset   Hypertension Father  Diabetes Neg Hx    Cancer Neg Hx    Hearing loss Neg Hx    Social History   Socioeconomic History   Marital status: Significant Other    Spouse name: Jimmy   Number of children: 3   Years of education: high school ged   Highest education level: GED or equivalent  Occupational History   Occupation: Statistician  Tobacco Use   Smoking status: Never   Smokeless tobacco: Never  Vaping Use   Vaping status: Former  Substance and Sexual Activity   Alcohol use: No    Alcohol/week: 0.0 standard drinks of alcohol   Drug use: No   Sexual activity: Yes    Partners: Male    Birth control/protection: Surgical    Comment: Tubal Ligation  Other Topics Concern   Not on file  Social History Narrative   Not on file    Social Drivers of Health   Financial Resource Strain: Low Risk  (01/22/2024)   Overall Financial Resource Strain (CARDIA)    Difficulty of Paying Living Expenses: Not hard at all  Food Insecurity: No Food Insecurity (01/22/2024)   Hunger Vital Sign    Worried About Running Out of Food in the Last Year: Never true    Ran Out of Food in the Last Year: Never true  Transportation Needs: No Transportation Needs (01/22/2024)   PRAPARE - Administrator, Civil Service (Medical): No    Lack of Transportation (Non-Medical): No  Physical Activity: Sufficiently Active (01/22/2024)   Exercise Vital Sign    Days of Exercise per Week: 5 days    Minutes of Exercise per Session: 90 min  Stress: Stress Concern Present (01/22/2024)   [employer] of Occupational Health - Occupational Stress Questionnaire    Feeling of Stress: Rather much  Social Connections: Moderately Isolated (01/22/2024)   Social Connection and Isolation Panel    Frequency of Communication with Friends and Family: More than three times a week    Frequency of Social Gatherings with Friends and Family: Twice a week    Attends Religious Services: Never    Database administrator or Organizations: No    Attends Engineer, structural: Not on file    Marital Status: Living with partner  Intimate Partner Violence: Not At Risk (10/11/2022)   Humiliation, Afraid, Rape, and Kick questionnaire    Fear of Current or Ex-Partner: No    Emotionally Abused: No    Physically Abused: No    Sexually Abused: No   Outpatient Medications Prior to Visit  Medication Sig Dispense Refill   sertraline  (ZOLOFT ) 25 MG tablet Take 1 tablet (25 mg total) by mouth daily. (Patient not taking: Reported on 01/23/2024) 90 tablet 1   No facility-administered medications prior to visit.   Allergies  Allergen Reactions   Femoxetine    Ferumoxytol  Rash   ROS: see HPI     Objective   Today's Vitals   01/23/24 0924  BP: 122/80  Pulse:  74  Temp: 98.1 F (36.7 C)  TempSrc: Oral  SpO2: 98%  Weight: 154 lb (69.9 kg)  Height: 5' 2 (1.575 m)    GENERAL: Well-appearing, in NAD. Well nourished.  SKIN: Pink, warm and dry. No rash, lesion, ulceration, or ecchymoses.  Head: Normocephalic. NECK: Trachea midline. Full ROM w/o pain or tenderness. No lymphadenopathy.  EARS: Tympanic membranes are intact, translucent without bulging and without drainage. Appropriate landmarks visualized.  EYES: Conjunctiva clear without exudates. EOMI, PERRL, no drainage  present.  NOSE: Septum midline w/o deformity. Nares patent, mucosa pink and non-inflamed w/o drainage. No sinus tenderness.  THROAT: Uvula midline. Oropharynx clear. Tonsils non-inflamed without exudate. Mucous membranes pink and moist.  RESPIRATORY: Chest wall symmetrical. Respirations even and non-labored. Breath sounds clear to auscultation bilaterally.  CARDIAC: S1, S2 present, regular rate and rhythm without murmur or gallops. Peripheral pulses 2+ bilaterally.  MSK: Muscle tone and strength appropriate for age. Joints w/o tenderness, redness, or swelling.  EXTREMITIES: Without clubbing, cyanosis, or edema.  NEUROLOGIC: No motor or sensory deficits. Steady, even gait. C2-C12 intact.  PSYCH/MENTAL STATUS: Alert, oriented x 3. Cooperative, appropriate mood and affect.     Assessment & Plan:   1. Encounter to establish care (Primary) Patient is a 63- year-old female female who presents today to establish care with primary care at [facility]. Reviewed the past medical history, family history, social history, surgical history, medications and allergies today- updates made as indicated. Patient has concerns today about mood.    2. MDD (major depressive disorder), recurrent episode, mild (HCC) Patient reports struggling with postpartum depression (PPD) after having her first 3 children. During her most recent and last pregnancy, she was on Zoloft  for the duration of the pregnancy to  help prevent PPD. She reports she was on Zoloft , prescribed by her OBGYN, but has been out for about a month. She reports noticing an improvement in her mood on the Zoloft  but feels it stopped being effective. PHQ9 completed with score of 10. Denies active or passive suicidal ideations.  Discussed restarting her Zoloft  today and patient is agreeable to this. Discussed benefits of cognitive behavioral therapy (CBT) and patient would like to consider at this time. Discussed common side effects, including GI side effects, insomnia, lethargy, and decreased libido (usually with higher doses). She is agreeable to trial SSRI medication at this time. Will start Zoloft  25mg  daily for 14 days and increase to 50mg  daily. Plan for 4 week follow-up. Rx sent to pharmacy on file.  - sertraline  (ZOLOFT ) 25 MG tablet; Take 1 tablet (25mg  total) by mouth daily for 14 days. After, take 2 tablets (50mg  total) by mouth daily.  Dispense: 90 tablet; Refill: 0  3. GAD (generalized anxiety disorder) GAD7 completed with score of 13. Denies issues with panic attacks, shortness of breath, difficulty breathing, palpitations, hyperventilation, and dizziness. Patient would like to restart Zoloft  25mg  daily. Discussed taking 25mg  daily for 14 days and increasing to 50mg  daily after that. Rx sent to pharmacy on file. Follow-up in 4 weeks.  - sertraline  (ZOLOFT ) 25 MG tablet; Take 1 tablet (25mg  total) by mouth daily for 14 days. After, take 2 tablets (50mg  total) by mouth daily.  Dispense: 90 tablet; Refill: 0  Return in about 4 weeks (around 02/20/2024) for Mood f/u.   Wilhelmena Hanson, FNP

## 2024-02-20 ENCOUNTER — Ambulatory Visit: Admitting: Family Medicine

## 2024-02-27 ENCOUNTER — Ambulatory Visit: Admitting: Family Medicine

## 2024-03-08 ENCOUNTER — Ambulatory Visit: Admitting: Family Medicine

## 2024-03-09 ENCOUNTER — Encounter: Payer: Self-pay | Admitting: Plastic Surgery

## 2024-03-09 ENCOUNTER — Ambulatory Visit (INDEPENDENT_AMBULATORY_CARE_PROVIDER_SITE_OTHER): Admitting: Plastic Surgery

## 2024-03-09 VITALS — BP 119/81 | HR 65 | Ht 61.0 in | Wt 150.0 lb

## 2024-03-09 DIAGNOSIS — Q839 Congenital malformation of breast, unspecified: Secondary | ICD-10-CM

## 2024-03-09 NOTE — Progress Notes (Signed)
   Subjective:    Patient ID: Marcia Hodges, female    DOB: 08/27/1995, 28 y.o.   MRN: [id number]  The patient is a 28 year old female here with her husband for follow-up on her breast reconstruction.  The patient had severe breast asymmetry congenitally.  She underwent implant placement February 13 and has 275 cc gel implant in place.  She still has a little bit of asymmetry.  We put the largest implant in that we could at the time because her tissue was so tight.  She also had a reduction on the left side.  I think that now that the implant has had time to settle we can make some improvements.  The asymmetry is very noticeable in her bra.  She has loss of volume in the upper right breast.      Review of Systems  Constitutional: Negative.   HENT: Negative.    Eyes: Negative.   Respiratory: Negative.    Cardiovascular: Negative.   Gastrointestinal: Negative.   Genitourinary: Negative.   Musculoskeletal: Negative.        Objective:   Physical Exam Vitals reviewed.  Constitutional:      Appearance: Normal appearance.  HENT:     Head: Atraumatic.  Cardiovascular:     Rate and Rhythm: Normal rate.     Pulses: Normal pulses.  Pulmonary:     Effort: Pulmonary effort is normal.  Abdominal:     Palpations: Abdomen is soft.  Neurological:     Mental Status: She is alert and oriented to person, place, and time.  Psychiatric:        Mood and Affect: Mood normal.        Behavior: Behavior normal.        Assessment & Plan:     ICD-10-CM   1. Congenital breast deformity  Q83.9       I think the patient would benefit from fat grafting to the right breast and some fat removal from the left breast.  We might even make the areola a little bit smaller on the left but she is going to think about that. Pictures were obtained of the patient and placed in the chart with the patient's or guardian's permission.

## 2024-03-11 ENCOUNTER — Ambulatory Visit: Admitting: Family

## 2024-03-25 ENCOUNTER — Ambulatory Visit: Admitting: Family Medicine

## 2024-04-02 ENCOUNTER — Encounter: Payer: Self-pay | Admitting: Student

## 2024-04-02 ENCOUNTER — Ambulatory Visit (INDEPENDENT_AMBULATORY_CARE_PROVIDER_SITE_OTHER): Admitting: Student

## 2024-04-02 VITALS — BP 111/75 | HR 75 | Ht 62.0 in | Wt 144.8 lb

## 2024-04-02 DIAGNOSIS — Q839 Congenital malformation of breast, unspecified: Secondary | ICD-10-CM

## 2024-04-02 DIAGNOSIS — Z719 Counseling, unspecified: Secondary | ICD-10-CM

## 2024-04-02 MED ORDER — CEPHALEXIN 500 MG PO CAPS: 500.0000 mg | ORAL_CAPSULE | Freq: Four times a day (QID) | ORAL | 0 refills | Status: AC

## 2024-04-02 MED ORDER — OXYCODONE HCL 5 MG PO TABS: 5.0000 mg | ORAL_TABLET | Freq: Four times a day (QID) | ORAL | 0 refills | Status: AC | PRN

## 2024-04-02 NOTE — H&P (View-Only) (Signed)
 Patient ID: Marcia Hodges, female    DOB: 1996-04-24, 28 y.o.   MRN: [id number]  Chief Complaint  Patient presents with   Pre-op Exam      ICD-10-CM   1. Congenital breast deformity  Q83.9        History of Present Illness: Marcia Hodges is a 28 y.o.  female  with a history of [place] Syndrome.  She presents for preoperative evaluation for upcoming procedure, left breast reduction with liposuction and right breast reconstruction with fat grafting, scheduled for 04/14/2024 with Dr.  Lowery  The patient has not had problems with anesthesia.  Patient denies any new history of cardiac disease.  She reports she is not a smoker.  Patient denies taking any birth control or hormone replacement.  She denies any personal or family history of blood clots or clotting diseases.  She denies any recent traumas, surgeries or infections.  She denies any history of stroke or heart attack.  She denies any history of Crohn's disease or ulcerative colitis, COPD or asthma.  She denies any history of cancer.  She denies any varicosities to lower extremities.  She denies any recent fevers, chills or changes in her health.  Summary of Previous Visit: Patient was seen by Dr. Lowery on 03/09/2024.  At this visit, it was noted that patient had underwent implant placement to the right breast on September 18, 2023 and had a 275 cc gel implant in place.  Patient still had a little bit of asymmetry.  Plan was to move forward with fat grafting to the right breast and some fat removal from the left breast.  Job: Works at [employer], planning to take 1 month off  PMH Significant for: Congenital breast deformity, MDD, GAD   Past Medical History: Allergies: Allergies  Allergen Reactions   Femoxetine    Ferumoxytol  Rash    Current Medications:  Current Outpatient Medications:    cephALEXin  (KEFLEX ) 500 MG capsule, Take 1 capsule (500 mg total) by mouth 4 (four) times daily for 3 days., Disp: 12  capsule, Rfl: 0   oxyCODONE  (ROXICODONE ) 5 MG immediate release tablet, Take 1 tablet (5 mg total) by mouth every 6 (six) hours as needed for up to 15 doses for severe pain (pain score 7-10)., Disp: 15 tablet, Rfl: 0   sertraline  (ZOLOFT ) 25 MG tablet, Take 1 tablet (25mg  total) by mouth daily for 14 days. After, take 2 tablets (50mg  total) by mouth daily., Disp: 90 tablet, Rfl: 0  Past Medical Problems: Past Medical History:  Diagnosis Date   Abnormal breast exam    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   Allergic rhinitis    Allergy    Anxiety    Depression    Gestational diabetes 02/21/2023   Diet-controlled  29 wks: EFW 1,344g 42.2%ile, AFI 14.5     Hematuria    Impaired glucose in pregnancy, antepartum 02/16/2023   Medical history non-contributory     Past Surgical History: Past Surgical History:  Procedure Laterality Date   BREAST ENHANCEMENT SURGERY Right 09/18/2023   Procedure: MAMMOPLASTY AUGMENTATION (BREAST);  Surgeon: Lowery Estefana RAMAN, DO;  Location: [facility];  Service: Plastics;  Laterality: Right;   BREAST SURGERY  09/18/23   COSMETIC SURGERY  09/18/23   MASTOPEXY Left 09/18/2023   Procedure: MASTOPEXY;  Surgeon: Lowery Estefana RAMAN, DO;  Location: [facility];  Service: Plastics;  Laterality: Left;   TUBAL LIGATION Bilateral 05/11/2023   Procedure: POST  PARTUM TUBAL LIGATION;  Surgeon: Janit Alm Agent, MD;  Location: [facility];  Service: Gynecology;  Laterality: Bilateral;   VAGINAL DELIVERY     wisdom teeth pulled      Social History: Social History   Socioeconomic History   Marital status: Significant Other    Spouse name: Jimmy   Number of children: 3   Years of education: high school ged   Highest education level: GED or equivalent  Occupational History   Occupation: Statistician  Tobacco Use   Smoking status: Never   Smokeless tobacco: Never  Vaping Use   Vaping status: Former  Substance and Sexual Activity    Alcohol use: No    Alcohol/week: 0.0 standard drinks of alcohol   Drug use: No   Sexual activity: Yes    Partners: Male    Birth control/protection: Surgical    Comment: Tubal Ligation  Other Topics Concern   Not on file  Social History Narrative   Not on file   Social Drivers of Health   Financial Resource Strain: Low Risk  (01/22/2024)   Overall Financial Resource Strain (CARDIA)    Difficulty of Paying Living Expenses: Not hard at all  Food Insecurity: No Food Insecurity (01/22/2024)   Hunger Vital Sign    Worried About Running Out of Food in the Last Year: Never true    Ran Out of Food in the Last Year: Never true  Transportation Needs: No Transportation Needs (01/22/2024)   PRAPARE - Administrator, Civil Service (Medical): No    Lack of Transportation (Non-Medical): No  Physical Activity: Sufficiently Active (01/22/2024)   Exercise Vital Sign    Days of Exercise per Week: 5 days    Minutes of Exercise per Session: 90 min  Stress: Stress Concern Present (01/22/2024)   [employer] of Occupational Health - Occupational Stress Questionnaire    Feeling of Stress: Rather much  Social Connections: Moderately Isolated (01/22/2024)   Social Connection and Isolation Panel    Frequency of Communication with Friends and Family: More than three times a week    Frequency of Social Gatherings with Friends and Family: Twice a week    Attends Religious Services: Never    Database administrator or Organizations: No    Attends Engineer, structural: Not on file    Marital Status: Living with partner  Intimate Partner Violence: Not At Risk (10/11/2022)   Humiliation, Afraid, Rape, and Kick questionnaire    Fear of Current or Ex-Partner: No    Emotionally Abused: No    Physically Abused: No    Sexually Abused: No    Family History: Family History  Problem Relation Age of Onset   Hypertension Father    Diabetes Neg Hx    Cancer Neg Hx    Hearing loss Neg Hx      Review of Systems: Denies any recent fevers, chills or changes in her health  Physical Exam: Vital Signs BP 111/75 (BP Location: Left Arm, Patient Position: Sitting, Cuff Size: Normal)   Pulse 75   Ht 5' 2 (1.575 m)   Wt 144 lb 12.8 oz (65.7 kg)   SpO2 98%   BMI 26.48 kg/m   Physical Exam  Constitutional:      General: Not in acute distress.    Appearance: Normal appearance. Not ill-appearing.  HENT:     Head: Normocephalic and atraumatic.  Neck:     Musculoskeletal: Normal range of motion.  Cardiovascular:  Rate and Rhythm: Normal rate Pulmonary:     Effort: Pulmonary effort is normal. No respiratory distress.  Musculoskeletal: Normal range of motion.  Skin:    General: Skin is warm and dry.     Findings: No erythema or rash.  Neurological:     Mental Status: Alert and oriented to person, place, and time. Mental status is at baseline.  Psychiatric:        Mood and Affect: Mood normal.        Behavior: Behavior normal.    Assessment/Plan: The patient is scheduled for bilateral breast reduction with Dr. Lowery.  Risks, benefits, and alternatives of procedure discussed, questions answered and consent obtained.    Smoking Status: Non-smoker; Counseling Given?  N/A Last Mammogram: N/A due to age  Caprini Score: 3; Risk Factors include: BMI > 25, and length of planned surgery. Recommendation for mechanical prophylaxis. Encourage early ambulation.   Pictures obtained: @consult   Post-op Rx sent to pharmacy: Oxycodone , Keflex , patient declined Zofran  at this time  Instructed patient to hold multivitamins and supplements at least 1 week prior to surgery.  She expressed understanding.  Patient was provided with the breast reduction and General Surgical Risk consent document and Pain Medication Agreement prior to their appointment.  They had adequate time to read through the risk consent documents and Pain Medication Agreement. We also discussed them in person  together during this preop appointment. All of their questions were answered to their satisfaction.  Recommended calling if they have any further questions.  Risk consent form and Pain Medication Agreement to be scanned into patient's chart.  The risk that can be encountered with breast reduction were discussed and include the following but not limited to these:  Breast asymmetry, fluid accumulation, firmness of the breast, inability to breast feed, loss of nipple or areola, skin loss, decrease or no nipple sensation, fat necrosis of the breast tissue, bleeding, infection, healing delay.  There are risks of anesthesia, changes to skin sensation and injury to nerves or blood vessels.  The muscle can be temporarily or permanently injured.  You may have an allergic reaction to tape, suture, glue, blood products which can result in skin discoloration, swelling, pain, skin lesions, poor healing.  Any of these can lead to the need for revisonal surgery or stage procedures.  A reduction has potential to interfere with diagnostic procedures.  Nipple or breast piercing can increase risks of infection.  This procedure is best done when the breast is fully developed.  Changes in the breast will continue to occur over time.  Pregnancy can alter the outcomes of previous breast reduction surgery, weight gain and weigh loss can also effect the long term appearance.     Electronically signed by: Estefana FORBES Peck, PA-C 04/02/2024 11:39 AM

## 2024-04-02 NOTE — Progress Notes (Signed)
 Patient ID: Marcia Hodges, female    DOB: 1996-04-24, 28 y.o.   MRN: [id number]  Chief Complaint  Patient presents with   Pre-op Exam      ICD-10-CM   1. Congenital breast deformity  Q83.9        History of Present Illness: Marcia Hodges is a 28 y.o.  female  with a history of [place] Syndrome.  She presents for preoperative evaluation for upcoming procedure, left breast reduction with liposuction and right breast reconstruction with fat grafting, scheduled for 04/14/2024 with Dr.  Lowery  The patient has not had problems with anesthesia.  Patient denies any new history of cardiac disease.  She reports she is not a smoker.  Patient denies taking any birth control or hormone replacement.  She denies any personal or family history of blood clots or clotting diseases.  She denies any recent traumas, surgeries or infections.  She denies any history of stroke or heart attack.  She denies any history of Crohn's disease or ulcerative colitis, COPD or asthma.  She denies any history of cancer.  She denies any varicosities to lower extremities.  She denies any recent fevers, chills or changes in her health.  Summary of Previous Visit: Patient was seen by Dr. Lowery on 03/09/2024.  At this visit, it was noted that patient had underwent implant placement to the right breast on September 18, 2023 and had a 275 cc gel implant in place.  Patient still had a little bit of asymmetry.  Plan was to move forward with fat grafting to the right breast and some fat removal from the left breast.  Job: Works at [employer], planning to take 1 month off  PMH Significant for: Congenital breast deformity, MDD, GAD   Past Medical History: Allergies: Allergies  Allergen Reactions   Femoxetine    Ferumoxytol  Rash    Current Medications:  Current Outpatient Medications:    cephALEXin  (KEFLEX ) 500 MG capsule, Take 1 capsule (500 mg total) by mouth 4 (four) times daily for 3 days., Disp: 12  capsule, Rfl: 0   oxyCODONE  (ROXICODONE ) 5 MG immediate release tablet, Take 1 tablet (5 mg total) by mouth every 6 (six) hours as needed for up to 15 doses for severe pain (pain score 7-10)., Disp: 15 tablet, Rfl: 0   sertraline  (ZOLOFT ) 25 MG tablet, Take 1 tablet (25mg  total) by mouth daily for 14 days. After, take 2 tablets (50mg  total) by mouth daily., Disp: 90 tablet, Rfl: 0  Past Medical Problems: Past Medical History:  Diagnosis Date   Abnormal breast exam    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   Allergic rhinitis    Allergy    Anxiety    Depression    Gestational diabetes 02/21/2023   Diet-controlled  29 wks: EFW 1,344g 42.2%ile, AFI 14.5     Hematuria    Impaired glucose in pregnancy, antepartum 02/16/2023   Medical history non-contributory     Past Surgical History: Past Surgical History:  Procedure Laterality Date   BREAST ENHANCEMENT SURGERY Right 09/18/2023   Procedure: MAMMOPLASTY AUGMENTATION (BREAST);  Surgeon: Lowery Estefana RAMAN, DO;  Location: [facility];  Service: Plastics;  Laterality: Right;   BREAST SURGERY  09/18/23   COSMETIC SURGERY  09/18/23   MASTOPEXY Left 09/18/2023   Procedure: MASTOPEXY;  Surgeon: Lowery Estefana RAMAN, DO;  Location: [facility];  Service: Plastics;  Laterality: Left;   TUBAL LIGATION Bilateral 05/11/2023   Procedure: POST  PARTUM TUBAL LIGATION;  Surgeon: Janit Alm Agent, MD;  Location: [facility];  Service: Gynecology;  Laterality: Bilateral;   VAGINAL DELIVERY     wisdom teeth pulled      Social History: Social History   Socioeconomic History   Marital status: Significant Other    Spouse name: Jimmy   Number of children: 3   Years of education: high school ged   Highest education level: GED or equivalent  Occupational History   Occupation: Statistician  Tobacco Use   Smoking status: Never   Smokeless tobacco: Never  Vaping Use   Vaping status: Former  Substance and Sexual Activity    Alcohol use: No    Alcohol/week: 0.0 standard drinks of alcohol   Drug use: No   Sexual activity: Yes    Partners: Male    Birth control/protection: Surgical    Comment: Tubal Ligation  Other Topics Concern   Not on file  Social History Narrative   Not on file   Social Drivers of Health   Financial Resource Strain: Low Risk  (01/22/2024)   Overall Financial Resource Strain (CARDIA)    Difficulty of Paying Living Expenses: Not hard at all  Food Insecurity: No Food Insecurity (01/22/2024)   Hunger Vital Sign    Worried About Running Out of Food in the Last Year: Never true    Ran Out of Food in the Last Year: Never true  Transportation Needs: No Transportation Needs (01/22/2024)   PRAPARE - Administrator, Civil Service (Medical): No    Lack of Transportation (Non-Medical): No  Physical Activity: Sufficiently Active (01/22/2024)   Exercise Vital Sign    Days of Exercise per Week: 5 days    Minutes of Exercise per Session: 90 min  Stress: Stress Concern Present (01/22/2024)   [employer] of Occupational Health - Occupational Stress Questionnaire    Feeling of Stress: Rather much  Social Connections: Moderately Isolated (01/22/2024)   Social Connection and Isolation Panel    Frequency of Communication with Friends and Family: More than three times a week    Frequency of Social Gatherings with Friends and Family: Twice a week    Attends Religious Services: Never    Database administrator or Organizations: No    Attends Engineer, structural: Not on file    Marital Status: Living with partner  Intimate Partner Violence: Not At Risk (10/11/2022)   Humiliation, Afraid, Rape, and Kick questionnaire    Fear of Current or Ex-Partner: No    Emotionally Abused: No    Physically Abused: No    Sexually Abused: No    Family History: Family History  Problem Relation Age of Onset   Hypertension Father    Diabetes Neg Hx    Cancer Neg Hx    Hearing loss Neg Hx      Review of Systems: Denies any recent fevers, chills or changes in her health  Physical Exam: Vital Signs BP 111/75 (BP Location: Left Arm, Patient Position: Sitting, Cuff Size: Normal)   Pulse 75   Ht 5' 2 (1.575 m)   Wt 144 lb 12.8 oz (65.7 kg)   SpO2 98%   BMI 26.48 kg/m   Physical Exam  Constitutional:      General: Not in acute distress.    Appearance: Normal appearance. Not ill-appearing.  HENT:     Head: Normocephalic and atraumatic.  Neck:     Musculoskeletal: Normal range of motion.  Cardiovascular:  Rate and Rhythm: Normal rate Pulmonary:     Effort: Pulmonary effort is normal. No respiratory distress.  Musculoskeletal: Normal range of motion.  Skin:    General: Skin is warm and dry.     Findings: No erythema or rash.  Neurological:     Mental Status: Alert and oriented to person, place, and time. Mental status is at baseline.  Psychiatric:        Mood and Affect: Mood normal.        Behavior: Behavior normal.    Assessment/Plan: The patient is scheduled for bilateral breast reduction with Dr. Lowery.  Risks, benefits, and alternatives of procedure discussed, questions answered and consent obtained.    Smoking Status: Non-smoker; Counseling Given?  N/A Last Mammogram: N/A due to age  Caprini Score: 3; Risk Factors include: BMI > 25, and length of planned surgery. Recommendation for mechanical prophylaxis. Encourage early ambulation.   Pictures obtained: @consult   Post-op Rx sent to pharmacy: Oxycodone , Keflex , patient declined Zofran  at this time  Instructed patient to hold multivitamins and supplements at least 1 week prior to surgery.  She expressed understanding.  Patient was provided with the breast reduction and General Surgical Risk consent document and Pain Medication Agreement prior to their appointment.  They had adequate time to read through the risk consent documents and Pain Medication Agreement. We also discussed them in person  together during this preop appointment. All of their questions were answered to their satisfaction.  Recommended calling if they have any further questions.  Risk consent form and Pain Medication Agreement to be scanned into patient's chart.  The risk that can be encountered with breast reduction were discussed and include the following but not limited to these:  Breast asymmetry, fluid accumulation, firmness of the breast, inability to breast feed, loss of nipple or areola, skin loss, decrease or no nipple sensation, fat necrosis of the breast tissue, bleeding, infection, healing delay.  There are risks of anesthesia, changes to skin sensation and injury to nerves or blood vessels.  The muscle can be temporarily or permanently injured.  You may have an allergic reaction to tape, suture, glue, blood products which can result in skin discoloration, swelling, pain, skin lesions, poor healing.  Any of these can lead to the need for revisonal surgery or stage procedures.  A reduction has potential to interfere with diagnostic procedures.  Nipple or breast piercing can increase risks of infection.  This procedure is best done when the breast is fully developed.  Changes in the breast will continue to occur over time.  Pregnancy can alter the outcomes of previous breast reduction surgery, weight gain and weigh loss can also effect the long term appearance.     Electronically signed by: Estefana FORBES Peck, PA-C 04/02/2024 11:39 AM

## 2024-04-07 ENCOUNTER — Encounter (HOSPITAL_BASED_OUTPATIENT_CLINIC_OR_DEPARTMENT_OTHER): Payer: Self-pay | Admitting: Plastic Surgery

## 2024-04-07 ENCOUNTER — Other Ambulatory Visit: Payer: Self-pay

## 2024-04-14 ENCOUNTER — Encounter (HOSPITAL_BASED_OUTPATIENT_CLINIC_OR_DEPARTMENT_OTHER): Payer: Self-pay | Admitting: Plastic Surgery

## 2024-04-14 ENCOUNTER — Ambulatory Visit (HOSPITAL_BASED_OUTPATIENT_CLINIC_OR_DEPARTMENT_OTHER): Admitting: Certified Registered Nurse Anesthetist

## 2024-04-14 ENCOUNTER — Other Ambulatory Visit: Payer: Self-pay

## 2024-04-14 ENCOUNTER — Ambulatory Visit (HOSPITAL_BASED_OUTPATIENT_CLINIC_OR_DEPARTMENT_OTHER)
Admission: RE | Admit: 2024-04-14 | Discharge: 2024-04-14 | Disposition: A | Discharge status: Home / Self Care | Attending: Plastic Surgery | Admitting: Plastic Surgery

## 2024-04-14 ENCOUNTER — Encounter (HOSPITAL_BASED_OUTPATIENT_CLINIC_OR_DEPARTMENT_OTHER)
Admission: RE | Disposition: A | Discharge status: Home / Self Care | Payer: Self-pay | Source: Home / Self Care | Attending: Plastic Surgery

## 2024-04-14 DIAGNOSIS — N6489 Other specified disorders of breast: Secondary | ICD-10-CM | POA: Insufficient documentation

## 2024-04-14 DIAGNOSIS — Q839 Congenital malformation of breast, unspecified: Secondary | ICD-10-CM

## 2024-04-14 DIAGNOSIS — N651 Disproportion of reconstructed breast: Secondary | ICD-10-CM | POA: Diagnosis not present

## 2024-04-14 HISTORY — PX: BREAST REDUCTION SURGERY: SHX8

## 2024-04-14 HISTORY — PX: LIPOSUCTION WITH LIPOFILLING: SHX6436

## 2024-04-14 LAB — GLUCOSE, CAPILLARY: Glucose-Capillary: 91 mg/dL (ref 70–99)

## 2024-04-14 LAB — POCT PREGNANCY, URINE: Preg Test, Ur: NEGATIVE

## 2024-04-14 SURGERY — BREAST REDUCTION WITH LIPOSUCTION
Anesthesia: General | Site: Breast | Laterality: Right

## 2024-04-14 MED ORDER — LACTATED RINGERS IV SOLN
INTRAVENOUS | Status: AC | PRN
  Administered 2024-04-14 (×2): 1000 mL

## 2024-04-14 MED ORDER — ONDANSETRON HCL 4 MG/2ML IJ SOLN
INTRAMUSCULAR | Status: DC | PRN
  Administered 2024-04-14: 4 mg via INTRAVENOUS

## 2024-04-14 MED ORDER — DEXMEDETOMIDINE HCL IN NACL 80 MCG/20ML IV SOLN
INTRAVENOUS | Status: DC | PRN
  Administered 2024-04-14 (×3): 4 ug via INTRAVENOUS

## 2024-04-14 MED ORDER — KETAMINE HCL 50 MG/5ML IJ SOSY
PREFILLED_SYRINGE | INTRAMUSCULAR | Status: DC | PRN
Start: 2024-04-14 — End: 2024-04-14
  Administered 2024-04-14: 10 mg via INTRAVENOUS
  Administered 2024-04-14: 40 mg via INTRAVENOUS

## 2024-04-14 MED ORDER — 0.9 % SODIUM CHLORIDE (POUR BTL) OPTIME
TOPICAL | Status: DC | PRN
  Administered 2024-04-14: 1000 mL

## 2024-04-14 MED ORDER — ONDANSETRON HCL 4 MG/2ML IJ SOLN
INTRAMUSCULAR | Status: AC
Start: 2024-04-14 — End: 2024-04-14
  Filled 2024-04-14: qty 2

## 2024-04-14 MED ORDER — LIDOCAINE HCL (PF) 1 % IJ SOLN
INTRAMUSCULAR | Status: AC
Start: 2024-04-14 — End: 2024-04-14
  Filled 2024-04-14: qty 30

## 2024-04-14 MED ORDER — OXYCODONE HCL 5 MG PO TABS
ORAL_TABLET | ORAL | Status: AC
  Filled 2024-04-14: qty 1

## 2024-04-14 MED ORDER — DEXMEDETOMIDINE HCL IN NACL 80 MCG/20ML IV SOLN
INTRAVENOUS | Status: AC
Start: 2024-04-14 — End: 2024-04-14
  Filled 2024-04-14: qty 20

## 2024-04-14 MED ORDER — OXYCODONE HCL 5 MG/5ML PO SOLN: 5.0000 mg | Freq: Once | ORAL | Status: AC | PRN

## 2024-04-14 MED ORDER — DEXAMETHASONE SODIUM PHOSPHATE 10 MG/ML IJ SOLN
INTRAMUSCULAR | Status: AC
Start: 2024-04-14 — End: 2024-04-14
  Filled 2024-04-14: qty 1

## 2024-04-14 MED ORDER — DEXAMETHASONE SODIUM PHOSPHATE 10 MG/ML IJ SOLN
INTRAMUSCULAR | Status: DC | PRN
  Administered 2024-04-14: 10 mg via INTRAVENOUS

## 2024-04-14 MED ORDER — ACETAMINOPHEN 325 MG RE SUPP: 650.0000 mg | RECTAL | Status: DC | PRN

## 2024-04-14 MED ORDER — CEFAZOLIN SODIUM-DEXTROSE 2-4 GM/100ML-% IV SOLN
2.0000 g | INTRAVENOUS | Status: DC
Start: 2024-04-14 — End: 2024-04-14

## 2024-04-14 MED ORDER — MIDAZOLAM HCL 2 MG/2ML IJ SOLN
INTRAMUSCULAR | Status: AC
  Filled 2024-04-14: qty 2

## 2024-04-14 MED ORDER — ACETAMINOPHEN 10 MG/ML IV SOLN
INTRAVENOUS | Status: AC
  Filled 2024-04-14: qty 100

## 2024-04-14 MED ORDER — BUPIVACAINE LIPOSOME 1.3 % IJ SUSP
INTRAMUSCULAR | Status: DC | PRN
  Administered 2024-04-14: 10 mL

## 2024-04-14 MED ORDER — CEFAZOLIN SODIUM-DEXTROSE 2-4 GM/100ML-% IV SOLN
INTRAVENOUS | Status: AC
Start: 2024-04-14 — End: 2024-04-14
  Filled 2024-04-14: qty 100

## 2024-04-14 MED ORDER — FENTANYL CITRATE (PF) 100 MCG/2ML IJ SOLN
INTRAMUSCULAR | Status: AC
  Filled 2024-04-14: qty 2

## 2024-04-14 MED ORDER — LIDOCAINE HCL 1 % IJ SOLN
INTRAVENOUS | Status: DC | PRN
  Administered 2024-04-14: 1000 mL

## 2024-04-14 MED ORDER — LIDOCAINE 2% (20 MG/ML) 5 ML SYRINGE
INTRAMUSCULAR | Status: DC | PRN
  Administered 2024-04-14: 60 mg via INTRAVENOUS

## 2024-04-14 MED ORDER — ACETAMINOPHEN 10 MG/ML IV SOLN
INTRAVENOUS | Status: DC | PRN
  Administered 2024-04-14: 1000 mg via INTRAVENOUS

## 2024-04-14 MED ORDER — PROPOFOL 500 MG/50ML IV EMUL
INTRAVENOUS | Status: DC | PRN
  Administered 2024-04-14: 50 ug/kg/min via INTRAVENOUS

## 2024-04-14 MED ORDER — HYDROMORPHONE HCL 1 MG/ML IJ SOLN
0.2500 mg | INTRAMUSCULAR | Status: DC | PRN
  Administered 2024-04-14: 0.25 mg via INTRAVENOUS

## 2024-04-14 MED ORDER — PROPOFOL 10 MG/ML IV BOLUS
INTRAVENOUS | Status: DC | PRN
  Administered 2024-04-14: 160 ug via INTRAVENOUS

## 2024-04-14 MED ORDER — ONDANSETRON HCL 4 MG/2ML IJ SOLN: 4.0000 mg | Freq: Once | INTRAMUSCULAR | Status: DC | PRN

## 2024-04-14 MED ORDER — SODIUM CHLORIDE 0.9 % IV SOLN: 250.0000 mL | INTRAVENOUS | Status: DC | PRN

## 2024-04-14 MED ORDER — OXYCODONE HCL 5 MG PO TABS
5.0000 mg | ORAL_TABLET | Freq: Once | ORAL | Status: AC | PRN
  Administered 2024-04-14: 5 mg via ORAL

## 2024-04-14 MED ORDER — FENTANYL CITRATE (PF) 100 MCG/2ML IJ SOLN: 25.0000 ug | INTRAMUSCULAR | Status: DC | PRN

## 2024-04-14 MED ORDER — HYDROMORPHONE HCL 1 MG/ML IJ SOLN
INTRAMUSCULAR | Status: AC
  Filled 2024-04-14: qty 0.5

## 2024-04-14 MED ORDER — PROPOFOL 1000 MG/100ML IV EMUL
INTRAVENOUS | Status: AC
  Filled 2024-04-14: qty 100

## 2024-04-14 MED ORDER — LIDOCAINE 2% (20 MG/ML) 5 ML SYRINGE
INTRAMUSCULAR | Status: AC
Start: 2024-04-14 — End: 2024-04-14
  Filled 2024-04-14: qty 10

## 2024-04-14 MED ORDER — FENTANYL CITRATE (PF) 100 MCG/2ML IJ SOLN
INTRAMUSCULAR | Status: DC | PRN
  Administered 2024-04-14: 100 ug via INTRAVENOUS

## 2024-04-14 MED ORDER — OXYCODONE HCL 5 MG PO TABS: 5.0000 mg | ORAL_TABLET | ORAL | Status: DC | PRN

## 2024-04-14 MED ORDER — ACETAMINOPHEN 325 MG PO TABS: 650.0000 mg | ORAL_TABLET | ORAL | Status: DC | PRN

## 2024-04-14 MED ORDER — ARTIFICIAL TEARS OPHTHALMIC OINT
TOPICAL_OINTMENT | OPHTHALMIC | Status: AC
  Filled 2024-04-14: qty 10.5

## 2024-04-14 MED ORDER — LIDOCAINE-EPINEPHRINE 1 %-1:100000 IJ SOLN
INTRAMUSCULAR | Status: DC | PRN
  Administered 2024-04-14: 10 mL

## 2024-04-14 MED ORDER — SODIUM CHLORIDE (PF) 0.9 % IJ SOLN
INTRAMUSCULAR | Status: AC
Start: 2024-04-14 — End: 2024-04-14
  Filled 2024-04-14: qty 10

## 2024-04-14 MED ORDER — MIDAZOLAM HCL 2 MG/2ML IJ SOLN
INTRAMUSCULAR | Status: DC | PRN
  Administered 2024-04-14: 2 mg via INTRAVENOUS

## 2024-04-14 MED ORDER — BUPIVACAINE LIPOSOME 1.3 % IJ SUSP
INTRAMUSCULAR | Status: AC
Start: 2024-04-14 — End: 2024-04-14
  Filled 2024-04-14: qty 10

## 2024-04-14 MED ORDER — SODIUM CHLORIDE 0.9% FLUSH
3.0000 mL | Freq: Two times a day (BID) | INTRAVENOUS | Status: DC
Start: 2024-04-14 — End: 2024-04-14

## 2024-04-14 MED ORDER — DROPERIDOL 2.5 MG/ML IJ SOLN: 0.6250 mg | Freq: Once | INTRAMUSCULAR | Status: DC | PRN

## 2024-04-14 MED ORDER — SODIUM CHLORIDE 0.9% FLUSH: 3.0000 mL | INTRAVENOUS | Status: DC | PRN

## 2024-04-14 MED ORDER — KETAMINE HCL 50 MG/5ML IJ SOSY
PREFILLED_SYRINGE | INTRAMUSCULAR | Status: AC
  Filled 2024-04-14: qty 5

## 2024-04-14 MED ORDER — CEFAZOLIN SODIUM-DEXTROSE 2-3 GM-%(50ML) IV SOLR
INTRAVENOUS | Status: DC | PRN
  Administered 2024-04-14: 2 g via INTRAVENOUS

## 2024-04-14 MED ORDER — SCOPOLAMINE 1 MG/3DAYS TD PT72: 1.0000 | MEDICATED_PATCH | TRANSDERMAL | Status: DC

## 2024-04-14 MED ORDER — CHLORHEXIDINE GLUCONATE CLOTH 2 % EX PADS
6.0000 | MEDICATED_PAD | Freq: Once | CUTANEOUS | Status: AC
  Administered 2024-04-14: 6 via TOPICAL

## 2024-04-14 MED ORDER — LACTATED RINGERS IV SOLN: INTRAVENOUS | Status: DC

## 2024-04-14 SURGICAL SUPPLY — 74 items
BINDER ABDOMINAL 10 UNV 27-48 (MISCELLANEOUS) IMPLANT
BINDER ABDOMINAL 12 SM 30-45 (SOFTGOODS) IMPLANT
BINDER ABDOMINAL 9 SM 30-45 (SOFTGOODS) IMPLANT
BINDER BREAST LRG (GAUZE/BANDAGES/DRESSINGS) IMPLANT
BINDER BREAST MEDIUM (GAUZE/BANDAGES/DRESSINGS) IMPLANT
BINDER BREAST XLRG (GAUZE/BANDAGES/DRESSINGS) IMPLANT
BINDER BREAST XXLRG (GAUZE/BANDAGES/DRESSINGS) IMPLANT
BIOPATCH RED 1 DISK 7.0 (GAUZE/BANDAGES/DRESSINGS) IMPLANT
BLADE HEX COATED 2.75 (ELECTRODE) IMPLANT
BLADE KNIFE PERSONA 10 (BLADE) ×4 IMPLANT
BLADE SURG 15 STRL LF DISP TIS (BLADE) ×2 IMPLANT
CANISTER SUCT 1200ML W/VALVE (MISCELLANEOUS) ×2 IMPLANT
CLEANSER WND VASHE 34 (WOUND CARE) ×2 IMPLANT
COTTONBALL LRG STERILE PKG (GAUZE/BANDAGES/DRESSINGS) IMPLANT
COVER BACK TABLE 60X90IN (DRAPES) ×2 IMPLANT
COVER MAYO STAND STRL (DRAPES) ×2 IMPLANT
DERMABOND ADVANCED .7 DNX12 (GAUZE/BANDAGES/DRESSINGS) ×4 IMPLANT
DRAIN CHANNEL 15F RND FF W/TCR (WOUND CARE) IMPLANT
DRAIN CHANNEL 19F RND (DRAIN) IMPLANT
DRAPE LAPAROSCOPIC ABDOMINAL (DRAPES) ×2 IMPLANT
DRAPE UTILITY XL STRL (DRAPES) ×2 IMPLANT
DRESSING MEPILEX FLEX 4X4 (GAUZE/BANDAGES/DRESSINGS) IMPLANT
DRSG MEPILEX POST OP 4X8 (GAUZE/BANDAGES/DRESSINGS) ×4 IMPLANT
DRSG TEGADERM 4X4.75 (GAUZE/BANDAGES/DRESSINGS) IMPLANT
DRSG TELFA 3X8 NADH STRL (GAUZE/BANDAGES/DRESSINGS) IMPLANT
ELECTRODE BLDE 4.0 EZ CLN MEGD (MISCELLANEOUS) ×2 IMPLANT
ELECTRODE REM PT RTRN 9FT ADLT (ELECTROSURGICAL) ×2 IMPLANT
EVACUATOR SILICONE 100CC (DRAIN) IMPLANT
EXTRACTOR CANIST REVOLVE STRL (CANNISTER) ×2 IMPLANT
GAUZE PAD ABD 8X10 STRL (GAUZE/BANDAGES/DRESSINGS) ×4 IMPLANT
GAUZE XEROFORM 5X9 LF (GAUZE/BANDAGES/DRESSINGS) IMPLANT
GLOVE BIO SURGEON STRL SZ 6.5 (GLOVE) ×4 IMPLANT
GLOVE BIO SURGEON STRL SZ7.5 (GLOVE) ×2 IMPLANT
GLOVE BIOGEL PI IND STRL 7.0 (GLOVE) IMPLANT
GLOVE BIOGEL PI IND STRL 8 (GLOVE) IMPLANT
GOWN STRL REUS W/ TWL LRG LVL3 (GOWN DISPOSABLE) ×4 IMPLANT
GOWN STRL REUS W/ TWL XL LVL3 (GOWN DISPOSABLE) IMPLANT
IV LACTATED RINGERS 1000ML (IV SOLUTION) ×4 IMPLANT
LINER CANISTER 1000CC FLEX (MISCELLANEOUS) ×2 IMPLANT
NDL FILTER BLUNT 18X1 1/2 (NEEDLE) IMPLANT
NDL HYPO 25X1 1.5 SAFETY (NEEDLE) ×4 IMPLANT
NEEDLE FILTER BLUNT 18X1 1/2 (NEEDLE) ×2 IMPLANT
NEEDLE HYPO 25X1 1.5 SAFETY (NEEDLE) ×4 IMPLANT
NS IRRIG 1000ML POUR BTL (IV SOLUTION) IMPLANT
PACK BASIN DAY SURGERY FS (CUSTOM PROCEDURE TRAY) ×2 IMPLANT
PAD ALCOHOL SWAB (MISCELLANEOUS) ×2 IMPLANT
PAD FOAM SILICONE BACKED (GAUZE/BANDAGES/DRESSINGS) ×2 IMPLANT
PENCIL SMOKE EVACUATOR (MISCELLANEOUS) ×2 IMPLANT
PIN SAFETY STERILE (MISCELLANEOUS) IMPLANT
SLEEVE SCD COMPRESS KNEE MED (STOCKING) ×2 IMPLANT
SOL PREP POV-IOD 4OZ 10% (MISCELLANEOUS) IMPLANT
SPIKE FLUID TRANSFER (MISCELLANEOUS) IMPLANT
SPONGE T-LAP 18X18 ~~LOC~~+RFID (SPONGE) ×4 IMPLANT
STRIP SUTURE WOUND CLOSURE 1/2 (MISCELLANEOUS) ×8 IMPLANT
SUT MNCRL AB 4-0 PS2 18 (SUTURE) ×8 IMPLANT
SUT MON AB 3-0 SH27 (SUTURE) ×8 IMPLANT
SUT MON AB 5-0 PS2 18 (SUTURE) ×4 IMPLANT
SUT PDS II 3-0 CT2 27 ABS (SUTURE) ×8 IMPLANT
SUT SILK 3 0 PS 1 (SUTURE) IMPLANT
SUT VIC AB 4-0 PS2 27 (SUTURE) IMPLANT
SYR 10ML LL (SYRINGE) ×10 IMPLANT
SYR 3ML 18GX1 1/2 (SYRINGE) IMPLANT
SYR 50ML LL SCALE MARK (SYRINGE) ×2 IMPLANT
SYR BULB IRRIG 60ML STRL (SYRINGE) ×2 IMPLANT
SYR CONTROL 10ML LL (SYRINGE) ×4 IMPLANT
SYR TOOMEY 50ML (SYRINGE) IMPLANT
TAPE MEASURE VINYL STERILE (MISCELLANEOUS) IMPLANT
TOWEL GREEN STERILE FF (TOWEL DISPOSABLE) ×6 IMPLANT
TRAY DSU PREP LF (CUSTOM PROCEDURE TRAY) ×2 IMPLANT
TUBE CONNECTING 20X1/4 (TUBING) ×2 IMPLANT
TUBING INFILTRATION IT-10001 (TUBING) ×2 IMPLANT
TUBING SET GRADUATE ASPIR 12FT (MISCELLANEOUS) ×2 IMPLANT
UNDERPAD 30X36 HEAVY ABSORB (UNDERPADS AND DIAPERS) ×4 IMPLANT
YANKAUER SUCT BULB TIP NO VENT (SUCTIONS) ×2 IMPLANT

## 2024-04-14 NOTE — Anesthesia Procedure Notes (Addendum)
 Procedure Name: LMA Insertion Date/Time: 04/14/2024 9:52 AM  Performed by: Leotha Andrez DEL, CRNAPre-anesthesia Checklist: Patient identified, Emergency Drugs available, Suction available, Patient being monitored and Timeout performed Patient Re-evaluated:Patient Re-evaluated prior to induction Oxygen Delivery Method: Circle system utilized Preoxygenation: Pre-oxygenation with 100% oxygen Induction Type: IV induction Ventilation: Mask ventilation without difficulty LMA: LMA with gastric port inserted LMA Size: 3.0 Number of attempts: 1 Placement Confirmation: breath sounds checked- equal and bilateral and positive ETCO2 Tube secured with: Tape Dental Injury: Teeth and Oropharynx as per pre-operative assessment

## 2024-04-14 NOTE — Transfer of Care (Signed)
 Immediate Anesthesia Transfer of Care Note  Patient: [facility]  Procedure(s) Performed: BREAST REDUCTION WITH LIPOSUCTION (Left: Breast) LIPOSUCTION, WITH FAT TRANSFER (Right: Breast)  Patient Location: PACU  Anesthesia Type:General  Level of Consciousness: drowsy and patient cooperative  Airway & Oxygen Therapy: Patient Spontanous Breathing and Patient connected to face mask oxygen  Post-op Assessment: Report given to RN and Post -op Vital signs reviewed and stable  Post vital signs: Reviewed and stable  Last Vitals:  Vitals Value Taken Time  BP 95/67 04/14/24 11:15  Temp 36.1 C 04/14/24 11:15  Pulse 83 04/14/24 11:23  Resp 12 04/14/24 11:23  SpO2 100 % 04/14/24 11:23  Vitals shown include unfiled device data.  Last Pain:  Vitals:   04/14/24 1115  TempSrc:   PainSc: Asleep      Patients Stated Pain Goal: 3 (04/14/24 0759)  Complications: No notable events documented.

## 2024-04-14 NOTE — Op Note (Addendum)
 Op report    DATE OF OPERATION: 04/14/2024  LOCATION: [facility]  SURGICAL DIVISION: Plastic Surgery  PREOPERATIVE DIAGNOSIS:  History of right breast congenital deformity.  2.   Significant breast asymmetry secondary to congenital deformity.   POSTOPERATIVE DIAGNOSIS:  Same as preoperative diagnosis  PROCEDURE:  1.  Right breast fat grafting 170 cc. 2.  Left breast reduction for symmetry 150 cc. 3.  Excision of asymmetric left areola 2 cm.  SURGEON: Estefana Fritter, DO  ANESTHESIA:  General.   COMPLICATIONS: None.   INDICATIONS FOR PROCEDURE:  The patient, Marcia Hodges, is a 28 y.o. female born on 05-Nov-1995, is here for treatment of a congenital breast deformity.  The patient had severe asymmetry.  She underwent implant placement on the right with reduction on the left.  Once everything settled and she still had some residual asymmetry.  She presents for symmetry surgery. MRN: [id number]  CONSENT:  Informed consent was obtained directly from the patient. Risks, benefits and alternatives were fully discussed. Specific risks including but not limited to bleeding, infection, hematoma, seroma, scarring, pain, implant infection, implant extrusion, capsular contracture, asymmetry, wound healing problems, and need for further surgery were all discussed. The patient did have an ample opportunity to have her questions answered to her satisfaction.   DESCRIPTION OF PROCEDURE:  The patient was taken to the operating room. SCDs were placed and IV antibiotics were given. The patient's abdomen and chest were prepped and draped in a sterile fashion. A time out was performed.  Tumescent was placed in the abdomen.  After waiting several minutes for the tumescent to work the liposuction was performed.  The adipose was harvested in the revolve device.  The manufacture guidelines were adhered to.  Approximately 350 cc of adipose was collected.  It was then washed  according to the manufacturer guidelines and placed in 10 cc syringes.  On the right breast: Local with epinephrine  was used to infiltrate at the incision site.  A small incision was made at the previous inframammary incision site.  This was used to inject the fat at the superior medial aspect of the breast.  An additional 2 mm incision was made laterally with a 15 blade.  This was used to introduce the remaining portion of the fat for the superior pole as well as superficially to the implant.  I was able to place 170 cc of adipose in the right breast.  All the incision sites on the right breast and abdomen were closed with 4-0 Monocryl.  Dermabond and Steri-Strips were applied along with a sterile dressing.   On the left breast: A small incision was made at the vertical limb of the previous incision site.  The tumescent was infused in the lateral and lower pole of the left breast.  Liposuction was then done and we collected 150 cc of adipose.  This looked like it gave much better symmetry.  The incision was closed with 4-0 Monocryl.  The areola was marked and there was an area of 2 cm from the 9 to the 12 o'clock position that was asymmetric.  This was excised elliptically.  The remaining skin was closed with 4-0 Monocryl.  Dermabond was applied to the incision site. A breast binder and ABDs were placed.  The patient was allowed to wake from anesthesia and taken to the recovery room in satisfactory condition.

## 2024-04-14 NOTE — Anesthesia Preprocedure Evaluation (Addendum)
 Anesthesia Evaluation  Patient identified by MRN, date of birth, ID band Patient awake    Reviewed: Allergy & Precautions, NPO status , Patient's Chart, lab work & pertinent test results  History of Anesthesia Complications Negative for: history of anesthetic complications  Airway Mallampati: III  TM Distance: >3 FB Neck ROM: Full   Comment: Previous grade I view with MAC 3, easy mask Dental no notable dental hx. (+) Dental Advisory Given, Teeth Intact   Pulmonary neg pulmonary ROS   Pulmonary exam normal breath sounds clear to auscultation       Cardiovascular negative cardio ROS  Rhythm:Regular Rate:Normal  TTE 11/17/2018: IMPRESSIONS    1. The left ventricle has low normal systolic function, with an ejection  fraction of 50-55%. The cavity size was mildly dilated. Left ventricular  diastolic parameters were normal.   2. The right ventricle has normal systolic function. The cavity was  normal. There is no increase in right ventricular wall thickness. Right  ventricular systolic pressure could not be assessed.   3. Trivial pericardial effusion is present.   4. No evidence of mitral valve stenosis.   5. The aortic valve is tricuspid.   6. The aortic root and ascending aorta are normal in size and structure.   7. The interatrial septum was not well visualized.     Neuro/Psych  PSYCHIATRIC DISORDERS Anxiety Depression    negative neurological ROS     GI/Hepatic negative GI ROS, Neg liver ROS,,,  Endo/Other  diabetes, Gestational  Congenital breast deformity  Renal/GU negative Renal ROS  negative genitourinary   Musculoskeletal negative musculoskeletal ROS (+)    Abdominal   Peds  Hematology negative hematology ROS (+)        Anesthesia Other Findings   Reproductive/Obstetrics                              Anesthesia Physical Anesthesia Plan  ASA: 2  Anesthesia Plan: General    Post-op Pain Management: Ofirmev  IV (intra-op)*   Induction: Intravenous  PONV Risk Score and Plan: 3 and Ondansetron , Dexamethasone , Treatment may vary due to age or medical condition, Midazolam  and Scopolamine  patch - Pre-op  Airway Management Planned: LMA and Oral ETT  Additional Equipment: None  Intra-op Plan:   Post-operative Plan: Extubation in OR  Informed Consent: I have reviewed the patients History and Physical, chart, labs and discussed the procedure including the risks, benefits and alternatives for the proposed anesthesia with the patient or authorized representative who has indicated his/her understanding and acceptance.     Dental advisory given  Plan Discussed with: CRNA and Anesthesiologist  Anesthesia Plan Comments: ( )         Anesthesia Quick Evaluation

## 2024-04-14 NOTE — Interval H&P Note (Signed)
 History and Physical Interval Note:  04/14/2024 9:09 AM  [name]  has presented today for surgery, with the diagnosis of Congenital breast deformity.  The various methods of treatment have been discussed with the patient and family. After consideration of risks, benefits and other options for treatment, the patient has consented to  Procedure(s) with comments: BREAST REDUCTION WITH LIPOSUCTION (Left) - Breast reconstruction with fat grafting to right breast and reduction to left breast LIPOSUCTION, WITH FAT TRANSFER (Right) as a surgical intervention.  The patient's history has been reviewed, patient examined, no change in status, stable for surgery.  I have reviewed the patient's chart and labs.  Questions were answered to the patient's satisfaction.     Marcia Hodges Marlen Koman

## 2024-04-14 NOTE — Discharge Instructions (Addendum)
 INSTRUCTIONS FOR AFTER BREAST SURGERY   You will likely have some questions about what to expect following your operation.  The following information will help you and your family understand what to expect when you are discharged from the hospital.  It is important to follow these guidelines to help ensure a smooth recovery and reduce complication.  Postoperative instructions include information on: diet, wound care, medications and physical activity.  AFTER SURGERY Expect to go home after the procedure.  In some cases, you may need to spend one night in the hospital for observation.  DIET Breast surgery does not require a specific diet.  However, the healthier you eat the better your body will heal. It is important to increasing your protein intake.  This means limiting the foods with sugar and carbohydrates.  Focus on vegetables and some meat.  If you have liposuction during your procedure be sure to drink water.  If your urine is bright yellow, then it is concentrated, and you need to drink more water.  As a general rule after surgery, you should have 8 ounces of water every hour while awake.  If you find you are persistently nauseated or unable to take in liquids let us  know.  NO TOBACCO USE or EXPOSURE.  This will slow your healing process and lead to a wound.  WOUND CARE Leave the binder on for 3 days . Use fragrance free soap like Dial, Dove or [place].   After 3 days you can remove the binder to shower. Once dry apply binder or sports bra. If you have liposuction you will have a soft and spongy dressing (Lipofoam) that helps prevent creases in your skin.  Remove before you shower and then replace it.  It is also available on [employer]. If you have steri-strips / tape directly attached to your skin leave them in place. It is OK to get these wet.   No baths, pools or hot tubs for four weeks. We close your incision to leave the smallest and best-looking scar. No ointment or creams on your incisions  for four weeks.  No Neosporin (Too many skin reactions).  A few weeks after surgery you can use Mederma and start massaging the scar. We ask you to wear your binder or sports bra for the first 6 weeks around the clock, including while sleeping. This provides added comfort and helps reduce the fluid accumulation at the surgery site. NO Ice or heating pads to the operative site.  You have a very high risk of a BURN before you feel the temperature change.  ACTIVITY No heavy lifting until cleared by the doctor.  This usually means no more than a half-gallon of milk.  It is OK to walk and climb stairs. Moving your legs is very important to decrease your risk of a blood clot.  It will also help keep you from getting deconditioned.  Every 1 to 2 hours get up and walk for 5 minutes. This will help with a quicker recovery back to normal.  Let pain be your guide so you don't do too much.  This time is for you to recover.  You will be more comfortable if you sleep and rest with your head elevated either with a few pillows under you or in a recliner.  No stomach sleeping for a three months.  WORK Everyone returns to work at different times. As a rough guide, most people take at least 1 - 2 weeks off prior to returning to work. If  you need documentation for your job, give the forms to the front staff at the clinic.  DRIVING Arrange for someone to bring you home from the hospital after your surgery.  You may be able to drive a few days after surgery but not while taking any narcotics or valium.  BOWEL MOVEMENTS Constipation can occur after anesthesia and while taking pain medication.  It is important to stay ahead for your comfort.  We recommend taking Milk of Magnesia (2 tablespoons; twice a day) while taking the pain pills.  MEDICATIONS You may be prescribed should start after surgery At your preoperative visit for you history and physical you were given the following medications: Antibiotic: Start this  medication when you get home and take according to the instructions on the bottle. Zofran  4 mg:  This is to treat nausea and vomiting.  You can take this every 6 hours as needed and only if needed. Oxycodone  5 mg every 6 hours for 3 - 5 days.  This is to be used after you have taken the Motrin  or the Tylenol . 4.   Gabapentin 300 mg every 12 hours for 7 days.  Over the counter Medication to take: Ibuprofen  (Motrin ) 400 - 600 mg every 6 hour for 7 days Tylenol  500 mg every 6 hours for 7 days.  Only take the Oxycodone  after you have tried these two. MiraLAX or stool softener of choice: Take this according to the bottle if you take the Norco.  If muscle work done:  Flexeril 5 mg every 12 hours for 7 days.  WHEN TO CALL Call your surgeon's office if any of the following occur: Fever 101 degrees F or greater Excessive bleeding or fluid from the incision site. Pain that increases over time without aid from the medications Redness, warmth, or pus draining from incision sites Persistent nausea or inability to take in liquids Severe misshapen area that underwent the operation.  Post Anesthesia Home Care Instructions  Activity: Get plenty of rest for the remainder of the day. A responsible individual must stay with you for 24 hours following the procedure.  For the next 24 hours, DO NOT: -Drive a car -Advertising copywriter -Drink alcoholic beverages -Take any medication unless instructed by your physician -Make any legal decisions or sign important papers.  Meals: Start with liquid foods such as gelatin or soup. Progress to regular foods as tolerated. Avoid greasy, spicy, heavy foods. If nausea and/or vomiting occur, drink only clear liquids until the nausea and/or vomiting subsides. Call your physician if vomiting continues.  Special Instructions/Symptoms: Your throat may feel dry or sore from the anesthesia or the breathing tube placed in your throat during surgery. If this causes discomfort,  gargle with warm salt water. The discomfort should disappear within 24 hours.  If you had a scopolamine  patch placed behind your ear for the management of post- operative nausea and/or vomiting:  1. The medication in the patch is effective for 72 hours, after which it should be removed.  Wrap patch in a tissue and discard in the trash. Wash hands thoroughly with soap and water. 2. You may remove the patch earlier than 72 hours if you experience unpleasant side effects which may include dry mouth, dizziness or visual disturbances. 3. Avoid touching the patch. Wash your hands with soap and water after contact with the patch.     Information for Discharge Teaching: EXPAREL  (bupivacaine  liposome injectable suspension)   Pain relief is important to your recovery. The goal is to control  your pain so you can move easier and return to your normal activities as soon as possible after your procedure. Your physician may use several types of medicines to manage pain, swelling, and more.  Your surgeon or anesthesiologist gave you EXPAREL (bupivacaine ) to help control your pain after surgery.  EXPAREL  is a local anesthetic designed to release slowly over an extended period of time to provide pain relief by numbing the tissue around the surgical site. EXPAREL  is designed to release pain medication over time and can control pain for up to 72 hours. Depending on how you respond to EXPAREL , you may require less pain medication during your recovery. EXPAREL  can help reduce or eliminate the need for opioids during the first few days after surgery when pain relief is needed the most. EXPAREL  is not an opioid and is not addictive. It does not cause sleepiness or sedation.   Important! A teal colored band has been placed on your arm with the date, time and amount of EXPAREL  you have received. Please leave this armband in place for the full 96 hours following administration, and then you may remove the band. If you  return to the hospital for any reason within 96 hours following the administration of EXPAREL , the armband provides important information that your health care providers to know, and alerts them that you have received this anesthetic.    Possible side effects of EXPAREL : Temporary loss of sensation or ability to move in the area where medication was injected. Nausea, vomiting, constipation Rarely, numbness and tingling in your mouth or lips, lightheadedness, or anxiety may occur. Call your doctor right away if you think you may be experiencing any of these sensations, or if you have other questions regarding possible side effects.  Follow all other discharge instructions given to you by your surgeon or nurse. Eat a healthy diet and drink plenty of water or other fluids.  Last received tylenol  at 1015am

## 2024-04-14 NOTE — Anesthesia Postprocedure Evaluation (Signed)
 Anesthesia Post Note  Patient: [facility]  Procedure(s) Performed: BREAST REDUCTION WITH LIPOSUCTION (Left: Breast) LIPOSUCTION, WITH FAT TRANSFER (Right: Breast)     Patient location during evaluation: PACU Anesthesia Type: General Level of consciousness: awake and alert and oriented Pain management: pain level controlled Vital Signs Assessment: post-procedure vital signs reviewed and stable Respiratory status: spontaneous breathing, nonlabored ventilation and respiratory function stable Cardiovascular status: blood pressure returned to baseline and stable Postop Assessment: no apparent nausea or vomiting Anesthetic complications: no   No notable events documented.  Last Vitals:  Vitals:   04/14/24 1145 04/14/24 1217  BP: 101/77 94/69  Pulse: 63 78  Resp: 16 16  Temp:  (!) 36.2 C  SpO2: 95% 98%    Last Pain:  Vitals:   04/14/24 1205  TempSrc:   PainSc: 4                  Evyn Putzier A.

## 2024-04-15 ENCOUNTER — Encounter (HOSPITAL_BASED_OUTPATIENT_CLINIC_OR_DEPARTMENT_OTHER): Payer: Self-pay | Admitting: Plastic Surgery

## 2024-04-23 ENCOUNTER — Ambulatory Visit: Admitting: Plastic Surgery

## 2024-04-23 VITALS — BP 113/76 | HR 86

## 2024-04-23 DIAGNOSIS — Q839 Congenital malformation of breast, unspecified: Secondary | ICD-10-CM

## 2024-04-23 NOTE — Progress Notes (Signed)
 The patient is a 28 year old female here for follow-up after undergoing a left breast reduction and a right fat grafting.  She has a little bit of bruising but overall she is doing really well.  We got pictures and put them in the chart with her permission.  She has much better symmetry and less hollowing on the right upper pole of the right breast.  Continue with the binders and follow-up in 2 weeks.

## 2024-05-05 ENCOUNTER — Encounter: Admitting: Student

## 2024-05-10 ENCOUNTER — Ambulatory Visit: Admitting: Student

## 2024-05-10 ENCOUNTER — Encounter: Payer: Self-pay | Admitting: Student

## 2024-05-10 VITALS — BP 107/73 | HR 71 | Ht 62.0 in | Wt 147.4 lb

## 2024-05-10 DIAGNOSIS — Q839 Congenital malformation of breast, unspecified: Secondary | ICD-10-CM

## 2024-05-10 NOTE — Progress Notes (Signed)
 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  Patient had a left breast reduction and reconstruction of the right breast with implant placement with Dr. Lowery back in February 2025.  Patient had a Mentor high-profile 275 cc gel implant placed.  She recently underwent right breast fat grafting, left breast reduction for symmetry and excision of asymmetric left areola with Dr. Lowery on 04/14/2024.  Patient is almost 4 weeks postop.  She presents to the clinic today for postoperative follow-up.  Patient was last seen in the clinic on 04/23/2024.  At this visit, patient had a little bit of bruising but overall was doing well.  It was noted that patient had much better symmetry and less hollowing of the right upper pole of the right breast.  Today, patient reports she is doing well.  She denies any new issues or concerns.  She denies any fevers or chills.  She denies any pain.  Chaperone present on exam.  On exam, patient is sitting upright in no acute distress.  Right breast is soft with implant in place.  There is no overlying erythema.  No obvious fluid collections on exam.  NAC appears to be healthy.  Steri-Strip is in place to the inferior incision, this was removed without any difficulty.  Incision appears to be well-healed.  There is also a small incision intact with Monocryl suture to the right lateral breast, suture was removed without any difficulty.  To the left breast, Steri-Strip was in place over the inferior incision.  Incision appear to be well-healed.  Abdomen was soft and nontender.  Steri-Strips were removed from the incisions and incisions were intact and healing well with Monocryl sutures.  Monocryl sutures were removed.  There were no signs of infection on exam.  Recommended that patient apply Vaseline to her incisions daily for the next week or so, and discussed with her that she may transition to scar creams if she  would like after that.  Patient expressed understanding.  Discussed with the patient that would like her to continue with compression to both the abdomen and the breasts for at least another 2 weeks.  Discussed with her she will also still have lifting restrictions for another 2 weeks.  She expressed understanding.  Patient to follow back up in another 2 to 3 weeks.  Instructed her to call if she has any questions or concerns about anything.

## 2024-05-18 ENCOUNTER — Encounter: Admitting: Student

## 2024-05-25 ENCOUNTER — Encounter: Payer: Self-pay | Admitting: Student

## 2024-05-25 ENCOUNTER — Ambulatory Visit: Admitting: Student

## 2024-05-25 VITALS — BP 91/63 | HR 71

## 2024-05-25 DIAGNOSIS — Q839 Congenital malformation of breast, unspecified: Secondary | ICD-10-CM

## 2024-05-25 NOTE — Progress Notes (Signed)
 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  Patient had a left breast reduction and reconstruction of the right breast with implant placement with Dr. Lowery back in February 2025.  Patient had a Mentor high-profile 275 cc gel implant placed.  She recently underwent right breast fat grafting, left breast reduction for symmetry and excision of asymmetric left areola with Dr. Lowery on 04/14/2024.  Patient is almost 6 weeks postop.  She presents to the clinic today for postoperative follow-up.  Patient was last seen in the clinic on 05/10/2024.  At this visit, patient was overall doing well.  Right breast implant was soft and in place.  There were no obvious fluid collections.  NAC was healthy.  Incision was well-healed.  To the left breast,  incision was well-healed.  Abdomen was soft and nontender.  Recommended that patient continue with compression and avoid heavy lifting.  Today, patient reports she is doing well.  She denies any new issues or concerns.  She denies any fevers or chills.  She reports that she is overall happy with her results.  Chaperone present on exam.  On exam, patient sitting upright in no acute distress.  Right breast implant is in place and is soft.  There is no overlying erythema, no fluid collection palpated on exam.  Incision appears to be well-healed.  Left breast is soft, no overlying erythema.  No obvious fluid collections on exam.  NAC is healthy.  Incisions are well-healed.  No signs of infection on exam.  Discussed with the patient she may continue with scar cream to her incisions.  Discussed with her that she no longer has to wear her sports bra may transition into a regular bra without underwire.  Discussed with her she may also increase her activities.  Patient expressed understanding.  Patient to follow-up as needed from a postoperative standpoint.  We will have her follow-up in 1 year as  well.  Instructed the patient to call if she has any questions or concerns about anything.  Pictures were obtained of the patient and placed in the chart with the patient's or guardian's permission.

## 2024-07-12 ENCOUNTER — Ambulatory Visit

## 2025-05-24 ENCOUNTER — Encounter: Admitting: Plastic Surgery
